# Patient Record
Sex: Female | Born: 1980 | Hispanic: Yes | Marital: Married | Smoking: Never smoker
Health system: Southern US, Community
[De-identification: ages and names within clinical notes are randomized; demographics above are authoritative.]

## PROBLEM LIST (undated history)

## (undated) ENCOUNTER — Inpatient Hospital Stay (HOSPITAL_COMMUNITY): Payer: Self-pay

## (undated) DIAGNOSIS — N920 Excessive and frequent menstruation with regular cycle: Secondary | ICD-10-CM

## (undated) DIAGNOSIS — N941 Unspecified dyspareunia: Secondary | ICD-10-CM

## (undated) DIAGNOSIS — O24419 Gestational diabetes mellitus in pregnancy, unspecified control: Secondary | ICD-10-CM

## (undated) DIAGNOSIS — N930 Postcoital and contact bleeding: Secondary | ICD-10-CM

## (undated) HISTORY — DX: Excessive and frequent menstruation with regular cycle: N92.0

## (undated) HISTORY — DX: Unspecified dyspareunia: N94.10

## (undated) HISTORY — PX: NO PAST SURGERIES: SHX2092

## (undated) HISTORY — DX: Postcoital and contact bleeding: N93.0

---
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
## 2002-01-14 ENCOUNTER — Inpatient Hospital Stay (HOSPITAL_COMMUNITY): Admission: AD | Admit: 2002-01-14 | Discharge: 2002-01-14 | Payer: Self-pay | Admitting: Obstetrics and Gynecology

## 2002-01-19 ENCOUNTER — Encounter: Admission: RE | Admit: 2002-01-19 | Discharge: 2002-01-19 | Payer: Self-pay | Admitting: Obstetrics & Gynecology

## 2002-01-19 ENCOUNTER — Encounter (HOSPITAL_COMMUNITY): Admission: RE | Admit: 2002-01-19 | Discharge: 2002-02-18 | Payer: Self-pay | Admitting: Obstetrics

## 2002-01-20 ENCOUNTER — Encounter: Admission: RE | Admit: 2002-01-20 | Discharge: 2002-04-20 | Payer: Self-pay | Admitting: Obstetrics and Gynecology

## 2002-01-21 ENCOUNTER — Encounter: Payer: Self-pay | Admitting: *Deleted

## 2002-01-26 ENCOUNTER — Encounter: Admission: RE | Admit: 2002-01-26 | Discharge: 2002-01-26 | Payer: Self-pay | Admitting: *Deleted

## 2002-02-02 ENCOUNTER — Encounter: Admission: RE | Admit: 2002-02-02 | Discharge: 2002-02-02 | Payer: Self-pay | Admitting: *Deleted

## 2002-02-09 ENCOUNTER — Encounter: Payer: Self-pay | Admitting: *Deleted

## 2002-02-09 ENCOUNTER — Inpatient Hospital Stay (HOSPITAL_COMMUNITY): Admission: AD | Admit: 2002-02-09 | Discharge: 2002-02-09 | Payer: Self-pay | Admitting: *Deleted

## 2002-02-09 ENCOUNTER — Encounter: Admission: RE | Admit: 2002-02-09 | Discharge: 2002-02-09 | Payer: Self-pay | Admitting: *Deleted

## 2002-02-16 ENCOUNTER — Encounter: Admission: RE | Admit: 2002-02-16 | Discharge: 2002-02-16 | Payer: Self-pay | Admitting: *Deleted

## 2002-02-19 ENCOUNTER — Inpatient Hospital Stay (HOSPITAL_COMMUNITY): Admission: AD | Admit: 2002-02-19 | Discharge: 2002-02-19 | Payer: Self-pay | Admitting: *Deleted

## 2002-02-21 ENCOUNTER — Inpatient Hospital Stay (HOSPITAL_COMMUNITY): Admission: RE | Admit: 2002-02-21 | Discharge: 2002-02-23 | Payer: Self-pay | Admitting: *Deleted

## 2004-09-19 ENCOUNTER — Inpatient Hospital Stay (HOSPITAL_COMMUNITY): Admission: AD | Admit: 2004-09-19 | Discharge: 2004-09-19 | Payer: Self-pay | Admitting: Obstetrics & Gynecology

## 2004-09-24 ENCOUNTER — Inpatient Hospital Stay (HOSPITAL_COMMUNITY): Admission: AD | Admit: 2004-09-24 | Discharge: 2004-09-27 | Payer: Self-pay | Admitting: *Deleted

## 2004-09-24 ENCOUNTER — Ambulatory Visit: Payer: Self-pay | Admitting: Family Medicine

## 2004-09-24 ENCOUNTER — Ambulatory Visit: Payer: Self-pay | Admitting: Obstetrics and Gynecology

## 2005-08-06 ENCOUNTER — Emergency Department (HOSPITAL_COMMUNITY): Admission: EM | Admit: 2005-08-06 | Discharge: 2005-08-06 | Payer: Self-pay | Admitting: Family Medicine

## 2007-02-15 ENCOUNTER — Ambulatory Visit: Payer: Self-pay | Admitting: Internal Medicine

## 2007-02-18 ENCOUNTER — Ambulatory Visit (HOSPITAL_COMMUNITY): Admission: RE | Admit: 2007-02-18 | Discharge: 2007-02-18 | Payer: Self-pay | Admitting: Internal Medicine

## 2007-03-22 ENCOUNTER — Ambulatory Visit: Payer: Self-pay | Admitting: Internal Medicine

## 2007-05-05 ENCOUNTER — Ambulatory Visit: Payer: Self-pay | Admitting: Family Medicine

## 2007-06-03 ENCOUNTER — Ambulatory Visit: Payer: Self-pay | Admitting: Sports Medicine

## 2007-06-03 ENCOUNTER — Encounter (INDEPENDENT_AMBULATORY_CARE_PROVIDER_SITE_OTHER): Payer: Self-pay | Admitting: Family Medicine

## 2007-06-03 LAB — CONVERTED CEMR LAB
Antibody Screen: NEGATIVE
Basophils Absolute: 0 10*3/uL (ref 0.0–0.1)
Eosinophils Absolute: 0.2 10*3/uL (ref 0.0–0.7)
Hemoglobin: 13.5 g/dL (ref 12.0–15.0)
Lymphocytes Relative: 23 % (ref 12–46)
MCHC: 34.1 g/dL (ref 30.0–36.0)
MCV: 86.3 fL (ref 78.0–100.0)
Monocytes Absolute: 0.6 10*3/uL (ref 0.2–0.7)
Monocytes Relative: 7 % (ref 3–11)
Neutro Abs: 6 10*3/uL (ref 1.7–7.7)
Neutrophils Relative %: 67 % (ref 43–77)
Rh Type: POSITIVE
WBC: 8.9 10*3/uL (ref 4.0–10.5)

## 2007-06-10 ENCOUNTER — Ambulatory Visit: Payer: Self-pay | Admitting: Sports Medicine

## 2007-06-10 ENCOUNTER — Encounter (INDEPENDENT_AMBULATORY_CARE_PROVIDER_SITE_OTHER): Payer: Self-pay | Admitting: Family Medicine

## 2007-06-10 DIAGNOSIS — N898 Other specified noninflammatory disorders of vagina: Secondary | ICD-10-CM | POA: Insufficient documentation

## 2007-06-10 LAB — CONVERTED CEMR LAB
Chlamydia, DNA Probe: NEGATIVE
GC Probe Amp, Genital: NEGATIVE
KOH Prep: NEGATIVE

## 2007-06-11 ENCOUNTER — Encounter (INDEPENDENT_AMBULATORY_CARE_PROVIDER_SITE_OTHER): Payer: Self-pay | Admitting: Family Medicine

## 2007-06-16 ENCOUNTER — Encounter (INDEPENDENT_AMBULATORY_CARE_PROVIDER_SITE_OTHER): Payer: Self-pay | Admitting: Family Medicine

## 2007-06-21 ENCOUNTER — Ambulatory Visit (HOSPITAL_COMMUNITY): Admission: RE | Admit: 2007-06-21 | Discharge: 2007-06-21 | Payer: Self-pay | Admitting: Internal Medicine

## 2007-06-22 ENCOUNTER — Encounter (INDEPENDENT_AMBULATORY_CARE_PROVIDER_SITE_OTHER): Payer: Self-pay | Admitting: Family Medicine

## 2007-07-02 ENCOUNTER — Ambulatory Visit: Payer: Self-pay | Admitting: Family Medicine

## 2007-07-02 ENCOUNTER — Telehealth (INDEPENDENT_AMBULATORY_CARE_PROVIDER_SITE_OTHER): Payer: Self-pay | Admitting: *Deleted

## 2007-07-27 ENCOUNTER — Telehealth (INDEPENDENT_AMBULATORY_CARE_PROVIDER_SITE_OTHER): Payer: Self-pay | Admitting: *Deleted

## 2007-07-27 ENCOUNTER — Ambulatory Visit: Payer: Self-pay | Admitting: Family Medicine

## 2007-07-27 ENCOUNTER — Encounter (INDEPENDENT_AMBULATORY_CARE_PROVIDER_SITE_OTHER): Payer: Self-pay | Admitting: Family Medicine

## 2007-07-27 LAB — CONVERTED CEMR LAB
Glucose, Urine, Semiquant: NEGATIVE
Nitrite: NEGATIVE
Protein, U semiquant: NEGATIVE

## 2007-08-24 ENCOUNTER — Ambulatory Visit (HOSPITAL_COMMUNITY): Admission: RE | Admit: 2007-08-24 | Discharge: 2007-08-24 | Payer: Self-pay | Admitting: Sports Medicine

## 2007-08-24 ENCOUNTER — Encounter (INDEPENDENT_AMBULATORY_CARE_PROVIDER_SITE_OTHER): Payer: Self-pay | Admitting: Family Medicine

## 2007-08-31 ENCOUNTER — Ambulatory Visit: Payer: Self-pay | Admitting: Family Medicine

## 2007-08-31 LAB — CONVERTED CEMR LAB
GTT, 1 hr: 189 mg/dL
Hemoglobin: 10.8 g/dL

## 2007-09-03 ENCOUNTER — Encounter (INDEPENDENT_AMBULATORY_CARE_PROVIDER_SITE_OTHER): Payer: Self-pay | Admitting: Family Medicine

## 2007-09-03 ENCOUNTER — Ambulatory Visit: Payer: Self-pay | Admitting: Family Medicine

## 2007-09-03 DIAGNOSIS — O24410 Gestational diabetes mellitus in pregnancy, diet controlled: Secondary | ICD-10-CM | POA: Insufficient documentation

## 2007-09-06 ENCOUNTER — Telehealth (INDEPENDENT_AMBULATORY_CARE_PROVIDER_SITE_OTHER): Payer: Self-pay | Admitting: *Deleted

## 2007-09-17 ENCOUNTER — Ambulatory Visit: Payer: Self-pay | Admitting: Family Medicine

## 2007-10-19 ENCOUNTER — Encounter (INDEPENDENT_AMBULATORY_CARE_PROVIDER_SITE_OTHER): Payer: Self-pay | Admitting: Family Medicine

## 2007-10-19 ENCOUNTER — Ambulatory Visit: Payer: Self-pay | Admitting: Family Medicine

## 2007-11-01 ENCOUNTER — Ambulatory Visit: Payer: Self-pay | Admitting: Family Medicine

## 2007-11-05 ENCOUNTER — Ambulatory Visit: Payer: Self-pay | Admitting: Family Medicine

## 2007-11-05 ENCOUNTER — Encounter (INDEPENDENT_AMBULATORY_CARE_PROVIDER_SITE_OTHER): Payer: Self-pay | Admitting: Family Medicine

## 2007-11-05 LAB — CONVERTED CEMR LAB: Whiff Test: NEGATIVE

## 2007-11-08 ENCOUNTER — Encounter: Payer: Self-pay | Admitting: *Deleted

## 2007-11-08 ENCOUNTER — Ambulatory Visit: Payer: Self-pay | Admitting: Family Medicine

## 2007-11-10 ENCOUNTER — Ambulatory Visit (HOSPITAL_COMMUNITY): Admission: RE | Admit: 2007-11-10 | Discharge: 2007-11-10 | Payer: Self-pay | Admitting: Family Medicine

## 2007-11-11 ENCOUNTER — Encounter (INDEPENDENT_AMBULATORY_CARE_PROVIDER_SITE_OTHER): Payer: Self-pay | Admitting: Family Medicine

## 2007-11-16 ENCOUNTER — Ambulatory Visit: Payer: Self-pay | Admitting: Family Medicine

## 2007-11-22 ENCOUNTER — Ambulatory Visit: Payer: Self-pay | Admitting: Family Medicine

## 2007-11-25 ENCOUNTER — Inpatient Hospital Stay (HOSPITAL_COMMUNITY): Admission: RE | Admit: 2007-11-25 | Discharge: 2007-11-27 | Payer: Self-pay | Admitting: Family Medicine

## 2007-11-25 ENCOUNTER — Ambulatory Visit: Payer: Self-pay | Admitting: Obstetrics and Gynecology

## 2007-12-10 ENCOUNTER — Encounter (INDEPENDENT_AMBULATORY_CARE_PROVIDER_SITE_OTHER): Payer: Self-pay | Admitting: Family Medicine

## 2008-01-24 ENCOUNTER — Encounter: Payer: Self-pay | Admitting: *Deleted

## 2008-05-23 ENCOUNTER — Ambulatory Visit: Payer: Self-pay | Admitting: Internal Medicine

## 2008-05-23 DIAGNOSIS — R1013 Epigastric pain: Secondary | ICD-10-CM

## 2008-05-23 LAB — CONVERTED CEMR LAB: Beta hcg, urine, semiquantitative: NEGATIVE

## 2008-06-15 IMAGING — US US OB LIMITED
1 series · 14 of 26 positions shown · non-contrast
Comparison: none

OBSTETRICAL ULTRASOUND:

 This ultrasound exam was performed in the [HOSPITAL] Ultrasound Department.  The OB US report was generated in the AS system, and faxed to the ordering physician.  This report is also available in [REDACTED] PACS.

[Series 1: us ob limited · 0.31mm/px · 14 of 26 slices shown]
[im 1/26]
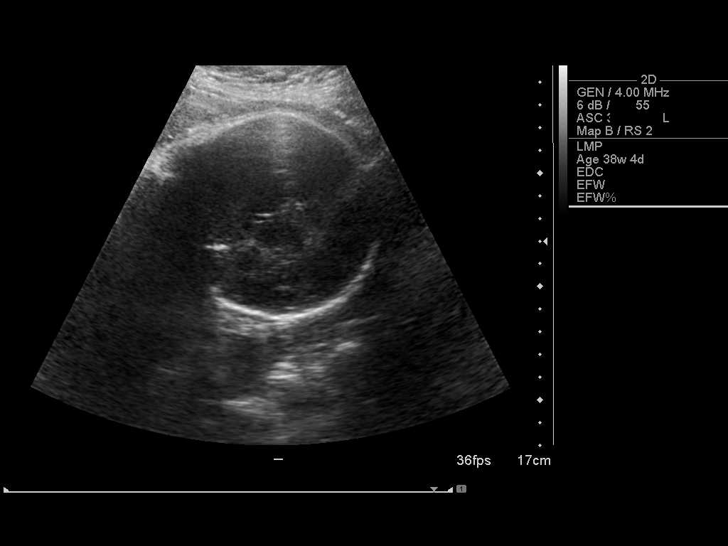
[im 3/26]
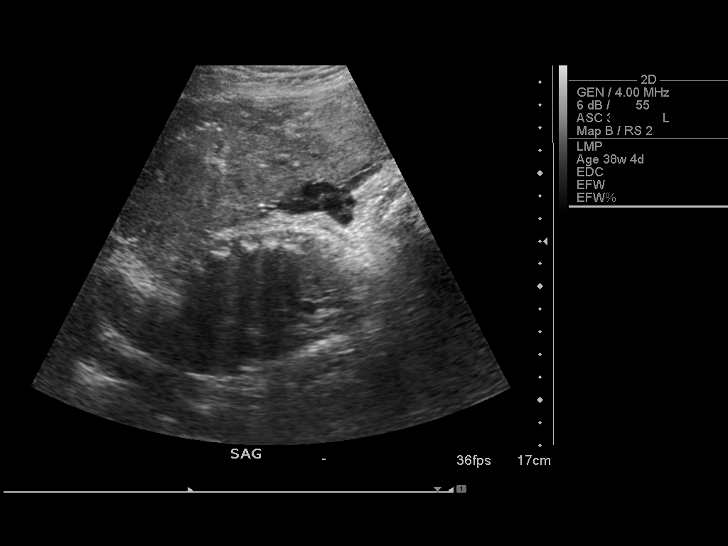
[im 5/26]
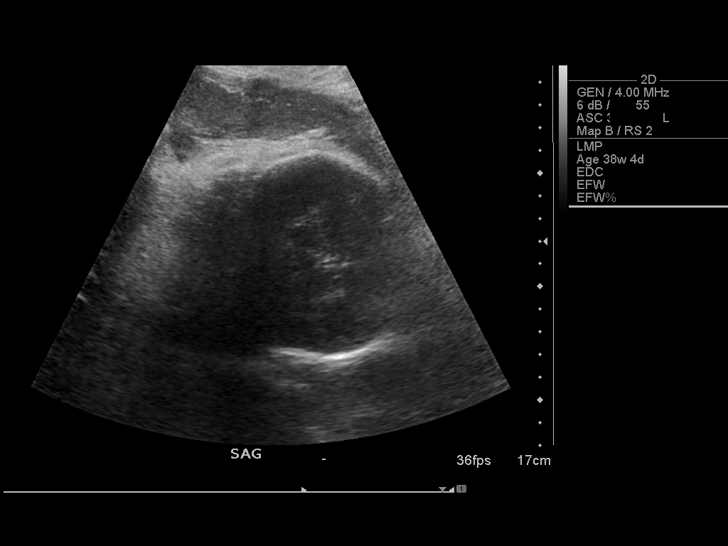
[im 7/26]
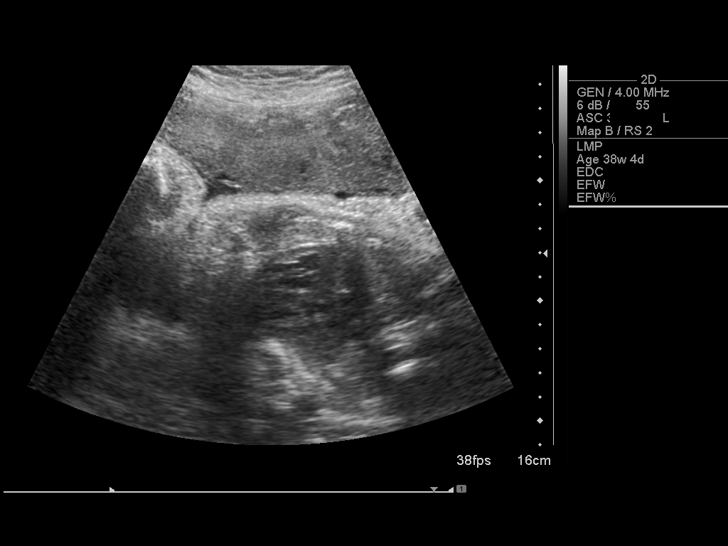
[im 9/26]
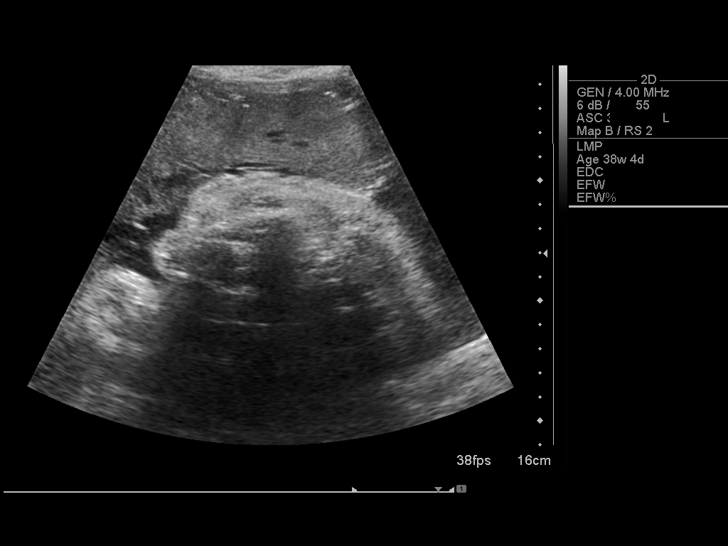
[im 11/26]
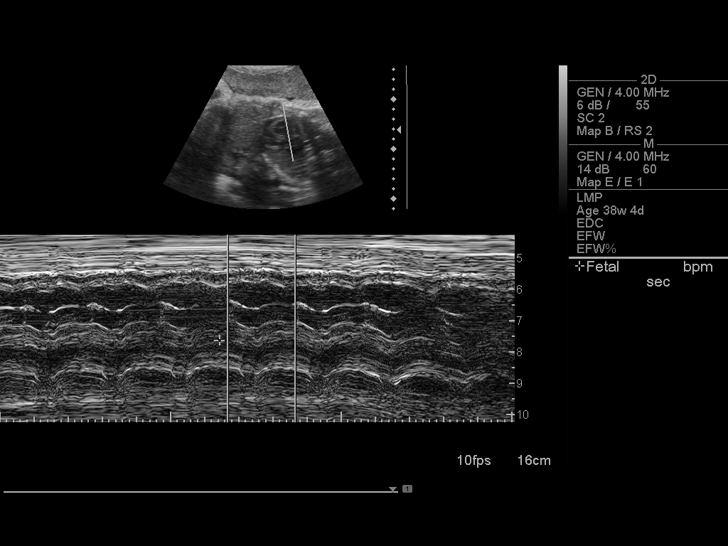
[im 13/26]
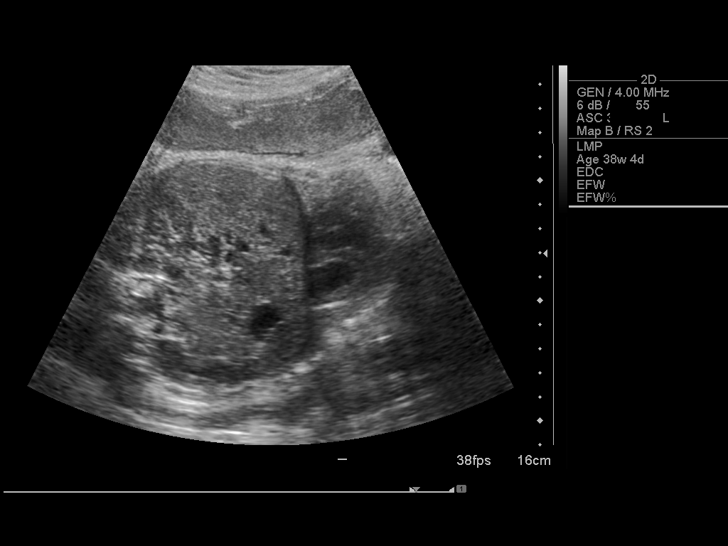
[im 14/26]
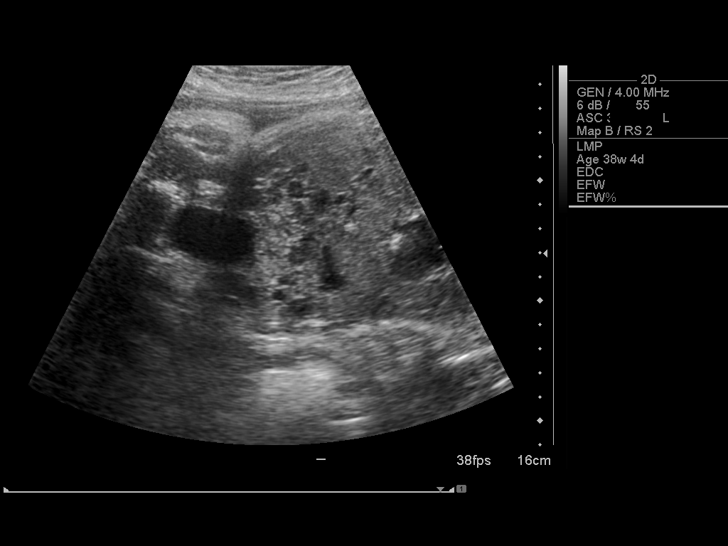
[im 16/26]
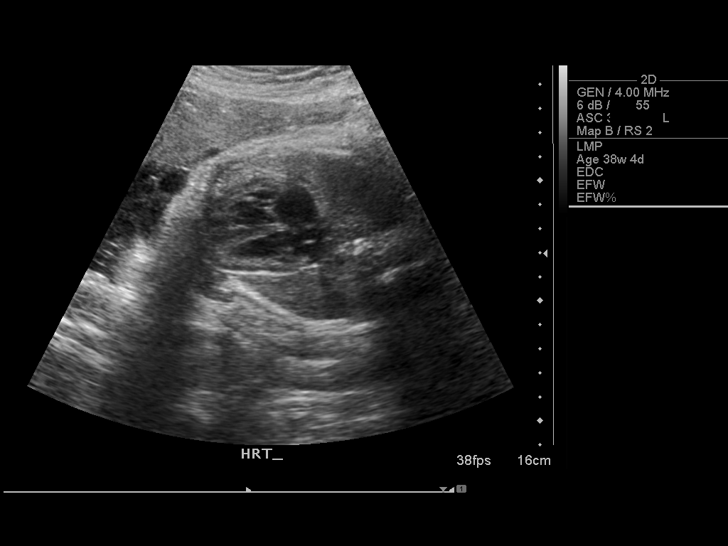
[im 18/26]
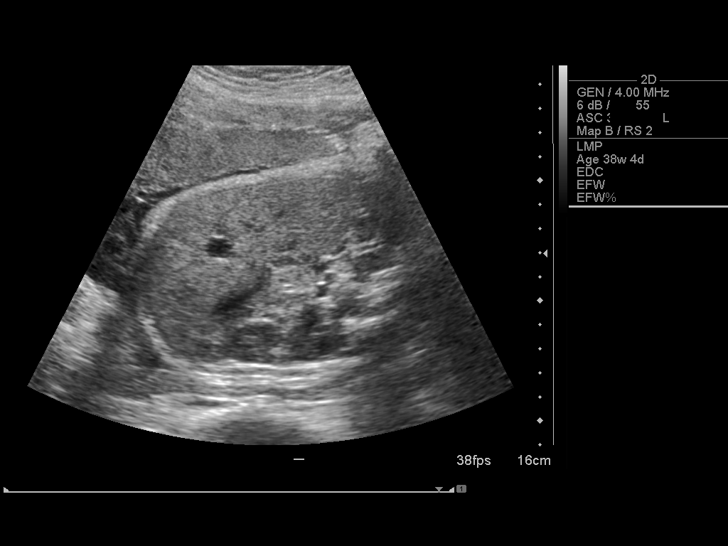
[im 20/26]
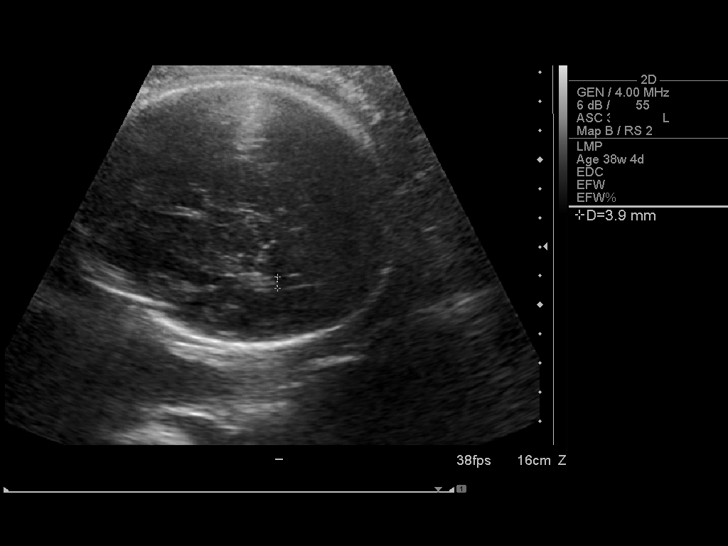
[im 22/26]
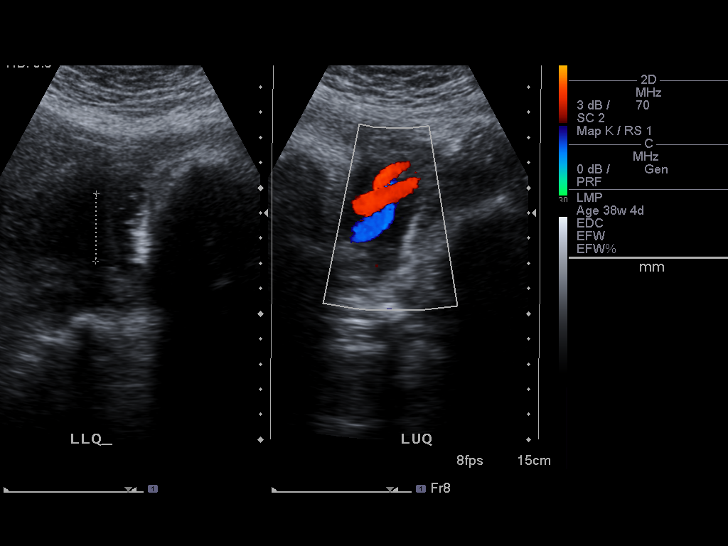
[im 24/26]
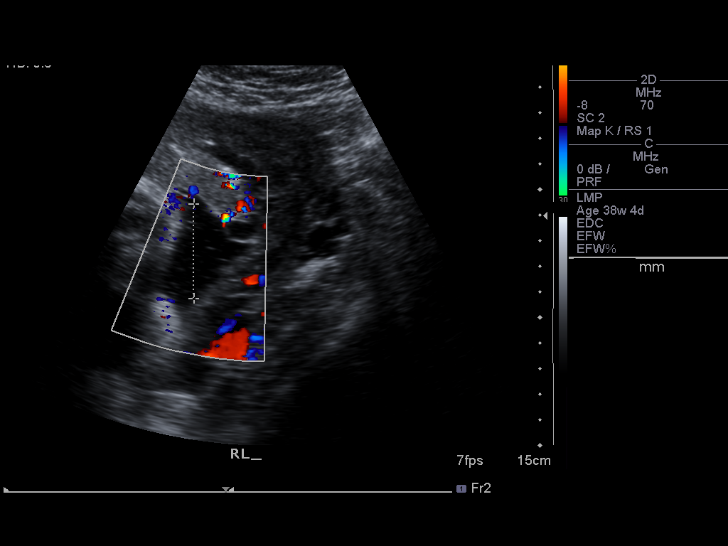
[im 26/26]
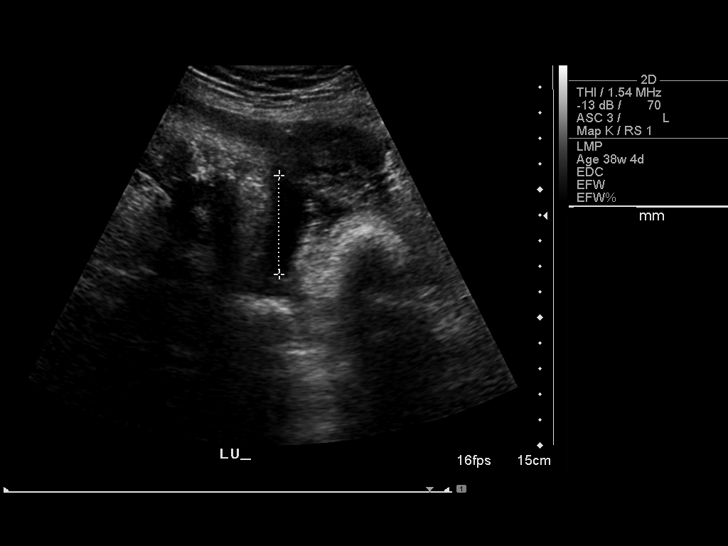

[14 of 26 positions shown; findings below may reference images not displayed]

IMPRESSION: See AS Obstetric US report.

## 2008-06-19 ENCOUNTER — Ambulatory Visit: Payer: Self-pay | Admitting: *Deleted

## 2008-06-19 ENCOUNTER — Ambulatory Visit: Payer: Self-pay | Admitting: Nurse Practitioner

## 2008-06-19 LAB — CONVERTED CEMR LAB
Basophils Absolute: 0.1 10*3/uL (ref 0.0–0.1)
Basophils Relative: 1 % (ref 0–1)
Eosinophils Absolute: 0.1 10*3/uL (ref 0.0–0.7)
Lymphs Abs: 2.6 10*3/uL (ref 0.7–4.0)
MCHC: 32.2 g/dL (ref 30.0–36.0)
MCV: 85.8 fL (ref 78.0–100.0)
Neutrophils Relative %: 54 % (ref 43–77)
Platelets: 256 10*3/uL (ref 150–400)

## 2008-08-30 ENCOUNTER — Emergency Department (HOSPITAL_COMMUNITY): Admission: EM | Admit: 2008-08-30 | Discharge: 2008-08-30 | Payer: Self-pay | Admitting: Emergency Medicine

## 2008-09-21 ENCOUNTER — Encounter (INDEPENDENT_AMBULATORY_CARE_PROVIDER_SITE_OTHER): Payer: Self-pay | Admitting: *Deleted

## 2008-10-16 ENCOUNTER — Telehealth (INDEPENDENT_AMBULATORY_CARE_PROVIDER_SITE_OTHER): Payer: Self-pay | Admitting: *Deleted

## 2008-12-05 ENCOUNTER — Ambulatory Visit: Payer: Self-pay | Admitting: Internal Medicine

## 2008-12-05 LAB — CONVERTED CEMR LAB
Beta hcg, urine, semiquantitative: NEGATIVE
Chlamydia, DNA Probe: NEGATIVE
GC Probe Amp, Genital: NEGATIVE
KOH Prep: NEGATIVE

## 2008-12-27 ENCOUNTER — Ambulatory Visit: Payer: Self-pay | Admitting: Internal Medicine

## 2008-12-27 ENCOUNTER — Encounter (INDEPENDENT_AMBULATORY_CARE_PROVIDER_SITE_OTHER): Payer: Self-pay | Admitting: Internal Medicine

## 2008-12-27 LAB — CONVERTED CEMR LAB
Basophils Absolute: 0.1 10*3/uL (ref 0.0–0.1)
Basophils Relative: 1 % (ref 0–1)
Blood in Urine, dipstick: NEGATIVE
CO2: 19 meq/L (ref 19–32)
Calcium: 9.2 mg/dL (ref 8.4–10.5)
Chloride: 104 meq/L (ref 96–112)
Creatinine, Ser: 0.81 mg/dL (ref 0.40–1.20)
Eosinophils Absolute: 0.2 10*3/uL (ref 0.0–0.7)
Eosinophils Relative: 3 % (ref 0–5)
GC Probe Amp, Genital: NEGATIVE
KOH Prep: NEGATIVE
LDL Cholesterol: 71 mg/dL (ref 0–99)
Lymphocytes Relative: 36 % (ref 12–46)
Lymphs Abs: 2.9 10*3/uL (ref 0.7–4.0)
Monocytes Absolute: 0.7 10*3/uL (ref 0.1–1.0)
Monocytes Relative: 9 % (ref 3–12)
Neutrophils Relative %: 53 % (ref 43–77)
Platelets: 264 10*3/uL (ref 150–400)
Potassium: 3.7 meq/L (ref 3.5–5.3)
Protein, U semiquant: NEGATIVE
RDW: 12.8 % (ref 11.5–15.5)
Sodium: 140 meq/L (ref 135–145)
Specific Gravity, Urine: <1.005
Total CHOL/HDL Ratio: 3.6
Triglycerides: 113 mg/dL (ref <150)
Whiff Test: NEGATIVE

## 2009-01-01 ENCOUNTER — Encounter (INDEPENDENT_AMBULATORY_CARE_PROVIDER_SITE_OTHER): Payer: Self-pay | Admitting: Internal Medicine

## 2009-02-20 ENCOUNTER — Encounter (INDEPENDENT_AMBULATORY_CARE_PROVIDER_SITE_OTHER): Payer: Self-pay | Admitting: Internal Medicine

## 2009-02-23 ENCOUNTER — Telehealth (INDEPENDENT_AMBULATORY_CARE_PROVIDER_SITE_OTHER): Payer: Self-pay | Admitting: Internal Medicine

## 2009-03-07 ENCOUNTER — Encounter (INDEPENDENT_AMBULATORY_CARE_PROVIDER_SITE_OTHER): Payer: Self-pay | Admitting: *Deleted

## 2009-03-27 ENCOUNTER — Telehealth (INDEPENDENT_AMBULATORY_CARE_PROVIDER_SITE_OTHER): Payer: Self-pay | Admitting: *Deleted

## 2009-03-30 ENCOUNTER — Ambulatory Visit: Payer: Self-pay | Admitting: Internal Medicine

## 2009-03-30 LAB — CONVERTED CEMR LAB: Whiff Test: NEGATIVE

## 2009-05-04 ENCOUNTER — Encounter: Payer: Self-pay | Admitting: Family Medicine

## 2009-05-04 ENCOUNTER — Ambulatory Visit: Payer: Self-pay | Admitting: Family Medicine

## 2009-05-04 LAB — CONVERTED CEMR LAB
Basophils Absolute: 0 10*3/uL (ref 0.0–0.1)
Eosinophils Relative: 2 % (ref 0–5)
HCT: 37.9 % (ref 36.0–46.0)
Hemoglobin: 13.2 g/dL (ref 12.0–15.0)
Lymphocytes Relative: 24 % (ref 12–46)
MCHC: 34.8 g/dL (ref 30.0–36.0)
Monocytes Absolute: 0.7 10*3/uL (ref 0.1–1.0)
Monocytes Relative: 7 % (ref 3–12)
RBC: 4.44 M/uL (ref 3.87–5.11)
RDW: 14.2 % (ref 11.5–15.5)
Rh Type: POSITIVE
Rubella: 306.9 intl units/mL — ABNORMAL HIGH
WBC: 9.4 10*3/uL (ref 4.0–10.5)

## 2009-05-11 ENCOUNTER — Encounter: Payer: Self-pay | Admitting: Family Medicine

## 2009-05-11 ENCOUNTER — Ambulatory Visit: Payer: Self-pay | Admitting: Family Medicine

## 2009-05-11 LAB — CONVERTED CEMR LAB

## 2009-06-01 ENCOUNTER — Telehealth: Payer: Self-pay | Admitting: *Deleted

## 2009-06-06 ENCOUNTER — Encounter: Payer: Self-pay | Admitting: Family Medicine

## 2009-06-19 ENCOUNTER — Ambulatory Visit: Payer: Self-pay | Admitting: Family Medicine

## 2009-06-19 ENCOUNTER — Encounter: Payer: Self-pay | Admitting: Family Medicine

## 2009-07-04 ENCOUNTER — Encounter: Payer: Self-pay | Admitting: Family Medicine

## 2009-07-17 ENCOUNTER — Ambulatory Visit: Payer: Self-pay | Admitting: Family Medicine

## 2009-07-17 ENCOUNTER — Encounter: Payer: Self-pay | Admitting: Family Medicine

## 2009-07-17 LAB — CONVERTED CEMR LAB
MCHC: 32.2 g/dL (ref 30.0–36.0)
MCV: 83.8 fL (ref 78.0–100.0)
Platelets: 220 10*3/uL (ref 150–400)
RBC: 3.96 M/uL (ref 3.87–5.11)
RDW: 13.1 % (ref 11.5–15.5)
WBC: 9.1 10*3/uL (ref 4.0–10.5)
Whiff Test: NEGATIVE

## 2009-08-02 ENCOUNTER — Encounter: Payer: Self-pay | Admitting: Family Medicine

## 2009-08-02 ENCOUNTER — Ambulatory Visit: Payer: Self-pay | Admitting: Family Medicine

## 2009-08-07 ENCOUNTER — Encounter: Payer: Self-pay | Admitting: Family Medicine

## 2009-08-07 ENCOUNTER — Ambulatory Visit (HOSPITAL_COMMUNITY): Admission: RE | Admit: 2009-08-07 | Discharge: 2009-08-07 | Payer: Self-pay | Admitting: Family Medicine

## 2009-08-22 ENCOUNTER — Encounter: Payer: Self-pay | Admitting: Family Medicine

## 2009-08-22 ENCOUNTER — Ambulatory Visit: Payer: Self-pay | Admitting: Family Medicine

## 2009-08-22 DIAGNOSIS — K649 Unspecified hemorrhoids: Secondary | ICD-10-CM | POA: Insufficient documentation

## 2009-08-22 LAB — CONVERTED CEMR LAB

## 2009-08-23 ENCOUNTER — Encounter: Payer: Self-pay | Admitting: Family Medicine

## 2009-09-06 ENCOUNTER — Ambulatory Visit: Payer: Self-pay | Admitting: Family Medicine

## 2009-09-19 ENCOUNTER — Ambulatory Visit: Payer: Self-pay | Admitting: Family Medicine

## 2009-09-19 ENCOUNTER — Encounter: Payer: Self-pay | Admitting: Family Medicine

## 2009-10-02 ENCOUNTER — Ambulatory Visit: Payer: Self-pay | Admitting: Family Medicine

## 2009-10-02 ENCOUNTER — Encounter: Payer: Self-pay | Admitting: Family Medicine

## 2009-10-02 LAB — CONVERTED CEMR LAB: Whiff Test: NEGATIVE

## 2009-10-09 ENCOUNTER — Encounter: Payer: Self-pay | Admitting: Family Medicine

## 2009-10-09 ENCOUNTER — Ambulatory Visit: Payer: Self-pay | Admitting: Family Medicine

## 2009-10-12 ENCOUNTER — Inpatient Hospital Stay (HOSPITAL_COMMUNITY): Admission: AD | Admit: 2009-10-12 | Discharge: 2009-10-12 | Payer: Self-pay | Admitting: Obstetrics & Gynecology

## 2009-10-16 ENCOUNTER — Ambulatory Visit: Payer: Self-pay | Admitting: Family Medicine

## 2009-10-22 ENCOUNTER — Encounter: Payer: Self-pay | Admitting: Family Medicine

## 2009-10-22 ENCOUNTER — Ambulatory Visit: Payer: Self-pay | Admitting: Family Medicine

## 2009-10-24 ENCOUNTER — Ambulatory Visit: Payer: Self-pay | Admitting: Obstetrics & Gynecology

## 2009-10-24 ENCOUNTER — Encounter: Payer: Self-pay | Admitting: Family Medicine

## 2009-10-26 ENCOUNTER — Ambulatory Visit: Payer: Self-pay | Admitting: Obstetrics & Gynecology

## 2009-10-26 ENCOUNTER — Encounter: Payer: Self-pay | Admitting: Family Medicine

## 2009-10-28 ENCOUNTER — Inpatient Hospital Stay (HOSPITAL_COMMUNITY): Admission: AD | Admit: 2009-10-28 | Discharge: 2009-10-30 | Payer: Self-pay | Admitting: Obstetrics & Gynecology

## 2009-10-28 ENCOUNTER — Ambulatory Visit: Payer: Self-pay | Admitting: Obstetrics and Gynecology

## 2009-11-06 ENCOUNTER — Telehealth: Payer: Self-pay | Admitting: *Deleted

## 2009-12-04 ENCOUNTER — Ambulatory Visit: Payer: Self-pay | Admitting: Family Medicine

## 2010-01-03 ENCOUNTER — Ambulatory Visit: Payer: Self-pay | Admitting: Internal Medicine

## 2010-01-07 ENCOUNTER — Encounter (INDEPENDENT_AMBULATORY_CARE_PROVIDER_SITE_OTHER): Payer: Self-pay | Admitting: Internal Medicine

## 2010-01-10 ENCOUNTER — Telehealth (INDEPENDENT_AMBULATORY_CARE_PROVIDER_SITE_OTHER): Payer: Self-pay | Admitting: Internal Medicine

## 2010-04-04 ENCOUNTER — Ambulatory Visit: Payer: Self-pay | Admitting: Nurse Practitioner

## 2010-04-04 DIAGNOSIS — N760 Acute vaginitis: Secondary | ICD-10-CM | POA: Insufficient documentation

## 2010-04-04 LAB — CONVERTED CEMR LAB
Beta hcg, urine, semiquantitative: NEGATIVE
Blood in Urine, dipstick: NEGATIVE
Specific Gravity, Urine: >=1.03
WBC Urine, dipstick: NEGATIVE

## 2010-05-01 ENCOUNTER — Telehealth (INDEPENDENT_AMBULATORY_CARE_PROVIDER_SITE_OTHER): Payer: Self-pay | Admitting: Nurse Practitioner

## 2010-05-02 ENCOUNTER — Ambulatory Visit: Payer: Self-pay | Admitting: Nurse Practitioner

## 2010-05-07 ENCOUNTER — Ambulatory Visit: Payer: Self-pay | Admitting: Internal Medicine

## 2010-05-07 DIAGNOSIS — E785 Hyperlipidemia, unspecified: Secondary | ICD-10-CM

## 2010-05-07 DIAGNOSIS — D649 Anemia, unspecified: Secondary | ICD-10-CM

## 2010-05-07 LAB — CONVERTED CEMR LAB
Blood in Urine, dipstick: NEGATIVE
Glucose, Urine, Semiquant: NEGATIVE
KOH Prep: NEGATIVE
Ketones, urine, test strip: NEGATIVE
Whiff Test: NEGATIVE

## 2010-05-08 ENCOUNTER — Encounter (INDEPENDENT_AMBULATORY_CARE_PROVIDER_SITE_OTHER): Payer: Self-pay | Admitting: Internal Medicine

## 2010-05-08 LAB — CONVERTED CEMR LAB
ALT: 21 units/L (ref 0–35)
Albumin: 4.4 g/dL (ref 3.5–5.2)
BUN: 8 mg/dL (ref 6–23)
CO2: 24 meq/L (ref 19–32)
Eosinophils Absolute: 0.4 10*3/uL (ref 0.0–0.7)
HCT: 41.1 % (ref 36.0–46.0)
Hemoglobin: 13.2 g/dL (ref 12.0–15.0)
Lymphs Abs: 3.3 10*3/uL (ref 0.7–4.0)
MCHC: 32.1 g/dL (ref 30.0–36.0)
Monocytes Absolute: 0.8 10*3/uL (ref 0.1–1.0)
Neutro Abs: 5.4 10*3/uL (ref 1.7–7.7)
Neutrophils Relative %: 54 % (ref 43–77)
Platelets: 291 10*3/uL (ref 150–400)
Potassium: 3.9 meq/L (ref 3.5–5.3)
RDW: 15.2 % (ref 11.5–15.5)
Total Bilirubin: 1.2 mg/dL (ref 0.3–1.2)
WBC: 9.9 10*3/uL (ref 4.0–10.5)

## 2010-05-18 ENCOUNTER — Encounter (INDEPENDENT_AMBULATORY_CARE_PROVIDER_SITE_OTHER): Payer: Self-pay | Admitting: Internal Medicine

## 2010-06-07 ENCOUNTER — Telehealth (INDEPENDENT_AMBULATORY_CARE_PROVIDER_SITE_OTHER): Payer: Self-pay | Admitting: Internal Medicine

## 2010-12-25 ENCOUNTER — Ambulatory Visit
Admission: RE | Admit: 2010-12-25 | Discharge: 2010-12-25 | Payer: Self-pay | Source: Home / Self Care | Attending: Internal Medicine | Admitting: Internal Medicine

## 2010-12-25 DIAGNOSIS — M542 Cervicalgia: Secondary | ICD-10-CM | POA: Insufficient documentation

## 2010-12-25 DIAGNOSIS — R071 Chest pain on breathing: Secondary | ICD-10-CM | POA: Insufficient documentation

## 2011-01-21 NOTE — Assessment & Plan Note (Signed)
Summary: CPP EXAM/////GK   Vital Signs:  Patient profile:   30 year old female Menstrual status:  regular LMP:     05/04/2010 Weight:      152 pounds Temp:     97.5 degrees F Pulse rate:   64 / minute Pulse rhythm:   regular Resp:     18 per minute BP sitting:   113 / 76  (left arm) Cuff size:   regular  Vitals Entered By: Vesta Mixer CMA (May 07, 2010 11:35 AM) CC: CPP Is Patient Diabetic? No Pain Assessment Patient in pain? no       Does patient need assistance? Ambulation Normal LMP (date): 05/04/2010 LMP - Character: normal LMP - Reliable? Yes Menarche (age onset years): 15   Menses interval (days): 28 Menstrual flow (days): 5 Date of + home preg. test: 02/14/2009 Enter LMP: 05/04/2010 Last PAP Result NEGATIVE FOR INTRAEPITHELIAL LESIONS OR MALIGNANCY.   Primary Care Provider:  Calyn Sivils  CC:  CPP.  History of Present Illness: 30 yo female here for CPP.  Concerns:  1.  Menstrual irregularities:  Had bleeding for almost a month--was seen by Jesse Fall earlier this month and just started on Yasmin again this past Sunday--stopped after 1 month of use in January as she was dizzy that entire month, but tolerating okay currently.      Habits & Providers  Alcohol-Tobacco-Diet     Alcohol drinks/day: 0     Tobacco Status: never  Exercise-Depression-Behavior     Drug Use: never  Allergies (verified): No Known Drug Allergies  Past History:  Past Medical History: Reviewed history from 12/27/2008 and no changes required. ROUTINE GYNECOLOGICAL EXAMINATION (ICD-V72.31) HEALTH SCREENING (ICD-V70.0) CHEST WALL PAIN, ACUTE (ICD-786.52) VAGINITIS (ICD-616.10) EXCESSIVE OR FREQUENT MENSTRUATION (ICD-626.2) EPIGASTRIC PAIN (ICD-789.06) CONTRACEPTIVE MANAGEMENT (ICD-V25.09) HYPERGLYCEMIA (ICD-790.29) UTI (ICD-599.0) LEUKORRHEA (ICD-623.5) PREGNANCY, NORMAL (ICD-V22.2)  Past Surgical History: Reviewed history from 12/27/2008 and no changes  required. None  Family History: Reviewed history from 03/30/2009 and no changes required. Mother, 24:  Ovarian cysts Father, 46:  Healthy 5 Brothers:  Healthy 3 Sisters:  1 sister with depression, unknown heart problem 3 Children:  40 yo daughter, 45 1/2 yo son, 50 1/2 year old son,  16 months  old son:  all healthy.  Social History: Reviewed history from 05/11/2009 and no changes required. Lives at home with husband and 4 children  Housewife Denies tobacco, alcohol, or substance abuseDrug Use:  never  Review of Systems General:  Energy is good. Eyes:  Denies blurring. ENT:  Denies decreased hearing. CV:  Denies chest pain or discomfort and palpitations. Resp:  Denies shortness of breath. GI:  Denies bloody stools, constipation, dark tarry stools, and diarrhea; Epigastric pain--radiates straight through to back.  Rates as a 2 out of 10 in severity.  Gets when does not eat breakfast until late in morning--only time.  Sometimes burning, sometimes like she has been hit there.  Not very frequent.. GU:  Denies discharge, dysuria, and urinary frequency. MS:  Denies joint pain, joint redness, and joint swelling. Derm:  Denies lesion(s) and rash. Neuro:  Denies numbness, tingling, and weakness. Psych:  Had some depression before had last baby,started walking and resolved.  Has not had since last child born.  No suicidal or homicidal thoughts..  Physical Exam  General:  Well-developed,well-nourished,in no acute distress; alert,appropriate and cooperative throughout examination Head:  Normocephalic and atraumatic without obvious abnormalities. No apparent alopecia or balding. Eyes:  No corneal or conjunctival inflammation noted. EOMI. Perrla.  Funduscopic exam benign, without hemorrhages, exudates or papilledema. Vision grossly normal. Ears:  External ear exam shows no significant lesions or deformities.  Otoscopic examination reveals clear canals, tympanic membranes are intact bilaterally  without bulging, retraction, inflammation or discharge. Hearing is grossly normal bilaterally. Nose:  External nasal examination shows no deformity or inflammation. Nasal mucosa are pink and moist without lesions or exudates. Mouth:  Oral mucosa and oropharynx without lesions or exudates.  Teeth in good repair. Neck:  No deformities, masses, or tenderness noted. Breasts:  No mass, nodules, thickening, tenderness, bulging, retraction, inflamation, nipple discharge or skin changes noted.   Lungs:  Normal respiratory effort, chest expands symmetrically. Lungs are clear to auscultation, no crackles or wheezes. Heart:  Normal rate and regular rhythm. S1 and S2 normal without gallop, murmur, click, rub or other extra sounds. Abdomen:  Bowel sounds positive,abdomen soft and non-tender without masses, organomegaly or hernias noted. Genitalia:  Pelvic Exam:        External: normal female genitalia without lesions or masses        Vagina: normal without lesions or masses        Cervix: normal without lesions or masses        Adnexa: normal bimanual exam without masses or fullness        Uterus: normal by palpation        Pap smear: performed Msk:  No deformity or scoliosis noted of thoracic or lumbar spine.   Pulses:  R and L carotid,radial,femoral,dorsalis pedis and posterior tibial pulses are full and equal bilaterally Extremities:  No clubbing, cyanosis, edema, or deformity noted with normal full range of motion of all joints.   Neurologic:  No cranial nerve deficits noted. Station and gait are normal. Plantar reflexes are down-going bilaterally. DTRs are symmetrical throughout. Sensory, motor and coordinative functions appear intact. Skin:  Intact without suspicious lesions or rashes Cervical Nodes:  No lymphadenopathy noted Axillary Nodes:  No palpable lymphadenopathy Inguinal Nodes:  No significant adenopathy Psych:  Cognition and judgment appear intact. Alert and cooperative with normal attention  span and concentration. No apparent delusions, illusions, hallucinations   Impression & Recommendations:  Problem # 1:  ROUTINE GYNECOLOGICAL EXAMINATION (ICD-V72.31)  Orders: KOH/ WET Mount [id number]) Pap Smear, Thin Prep ( Collection of) [phone]) UA Dipstick w/o Micro (manual) (84696) T-HIV Antibody  (Reflex) [id number]) T-Syphilis Test (RPR) [phone]) T-Pap Smear, Thin Prep ([address]) T- GC Chlamydia (47425)  Problem # 2:  ANEMIA (ICD-285.9)  Orders: T-CBC w/Diff ([id number])  Problem # 3:  DYSLIPIDEMIA (ICD-272.4)  Orders: T-Lipid Profile ([id number])  Problem # 4:  CONTRACEPTIVE MANAGEMENT (ICD-V25.09) To continue on Yasmin--if a problem with it or raises the funds for IUD placement, to call.  Problem # 5:  EPIGASTRIC PAIN (ICD-789.06) To call if becomes a problem more than when she misses breakfast  Complete Medication List: 1)  Yasmin 28 3-0.03 Mg Tabs (Drospirenone-ethinyl estradiol) .... Take 1 tab daily as directed.  Other Orders: T-Comprehensive Metabolic Panel ([id number])  Patient Instructions: 1)  CPP with Dr. Delrae Alfred in 1 year.  Call for appt.  Preventive Care Screening  Prior Values:    Pap Smear:  NEGATIVE FOR INTRAEPITHELIAL LESIONS OR MALIGNANCY. (12/27/2008)    Last Tetanus Booster:  Td (02/18/2007)     SBE:  Yes--no changes. LMP:  as above--bled for a month. Osteoprevention:  1 cup of milk daily.  Finds dairy constipating.  Walks in afternoon.  Prescriptions: YASMIN 28 3-0.03 MG TABS (DROSPIRENONE-ETHINYL ESTRADIOL) Take 1 tab daily  as directed.  #1 pack x 11   Entered and Authorized by:   Julieanne Manson MD   Signed by:   Julieanne Manson MD on 05/07/2010   Method used:   Faxed to ...       [facility] (retail)       [address] [place], [place]  [address]       Ph: [phone]       Fax: [phone]   RxID:   [id number]   Laboratory Results   Urine  Tests    Routine Urinalysis   Glucose: negative   (Normal Range: Negative) Bilirubin: negative   (Normal Range: Negative) Ketone: negative   (Normal Range: Negative) Spec. Gravity: 1.010   (Normal Range: 1.003-1.035) Blood: negative   (Normal Range: Negative) pH: 7.0   (Normal Range: 5.0-8.0) Protein: negative   (Normal Range: Negative) Urobilinogen: negative   (Normal Range: 0-1) Nitrite: negative   (Normal Range: Negative) Leukocyte Esterace: trace   (Normal Range: Negative)      Wet Mount/KOH Source: vaginal WBC/hpf: 5-10 Bacteria/hpf: 1+ Clue cells/hpf: few  Negative whiff Yeast/hpf: none Trichomonas/hpf: none  Other Tests  Rapid HIV: negative    Laboratory Results   Urine Tests    Routine Urinalysis   Glucose: negative   (Normal Range: Negative) Bilirubin: negative   (Normal Range: Negative) Ketone: negative   (Normal Range: Negative) Spec. Gravity: 1.010   (Normal Range: 1.003-1.035) Blood: negative   (Normal Range: Negative) pH: 7.0   (Normal Range: 5.0-8.0) Protein: negative   (Normal Range: Negative) Urobilinogen: negative   (Normal Range: 0-1) Nitrite: negative   (Normal Range: Negative) Leukocyte Esterace: trace   (Normal Range: Negative)      Wet Mount  Negative whiff Wet Mount KOH: Negative  Other Tests  Rapid HIV: negative

## 2011-01-21 NOTE — Assessment & Plan Note (Signed)
Summary: Acute - Bacterial Vaginosis   Vital Signs:  Patient profile:   30 year old female Menstrual status:  regular Height:      61 inches Weight:      146 pounds BMI:     27.69 Temp:     97.6 degrees F oral Pulse rate:   82 / minute Pulse rhythm:   regular Resp:     20 per minute BP sitting:   116 / 76  (left arm) Cuff size:   regular  Vitals Entered By: [place] Shannon (April 04, 2010 9:57 AM) CC: pt is here for vaginal infection, Vaginal discharge Is Patient Diabetic? No Pain Assessment Patient in pain? no       Does patient need assistance? Functional Status Self care Ambulation Normal LMP - Character: normal LMP - Reliable? Yes Menarche (age onset years): 15   Menses interval (days): 28 Menstrual flow (days): 5 Date of + home preg. test: 02/14/2009 Menstrual Status regular Last PAP Result NEGATIVE FOR INTRAEPITHELIAL LESIONS OR MALIGNANCY.   Primary Care Provider:  Delrae Alfred  CC:  pt is here for vaginal infection and Vaginal discharge.  History of Present Illness: Family Planning Menses - March 24th that lasted for 5 days She used vagisil  over the counter which was not effective for current discharge Infant if 43 months old - she stopped taking her birth control pills due to nausea and dizziness. She was taking the medication at 10PM at night.  No current contraception. She was started on the medications after giving birth to her now 80 month old son.  She took the medication for 1 month then stopped.  Vaginal discharge      This is a 30 year old woman who presents with Vaginal discharge.  The symptoms began 4 days ago.  The severity is described as moderate.  The patient complains of itching, but denies vaginal burning, burning on urination, and fever.  The discharge is described as yellow.  Associated symptoms include unusual vaginal bleeding.  The patient denies the following symptoms: genital sores, painful intercourse, rash, and headache.    Current  Medications (verified): 1)  Prenatal Vitamins 0.8 Mg Tabs (Prenatal Multivit-Min-Fe-Fa) .Marland Kitchen.. 1 Tab By Mouth Daily 2)  Anusol-Hc 2.5 % Crea (Hydrocortisone) .... Apply To Rectum Twice A Day 3)  Ortho Micronor 0.35 Mg Tabs (Norethindrone (Contraceptive)) .... Take 1 Tab By Mouth Daily At The Same Time Everyday 4)  Yasmin 28 3-0.03 Mg Tabs (Drospirenone-Ethinyl Estradiol) .... Take 1 Tab Daily As Directed.  Allergies (verified): No Known Drug Allergies  Review of Systems General:  Denies fever. Resp:  Denies cough. GI:  Denies abdominal pain, nausea, and vomiting. GU:  Complains of discharge, dysuria, and urinary frequency.  Physical Exam  General:  alert.   Head:  normocephalic.   Lungs:  normal breath sounds.   Heart:  normal rate and regular rhythm.   Msk:  normal ROM.   Neurologic:  alert & oriented X3.   Psych:  Oriented X3.     Impression & Recommendations:  Problem # 1:  BACTERIAL VAGINITIS (ICD-616.10)  pt advised of the dx handout given  will start antibiotic Her updated medication list for this problem includes:    Metronidazole 500 Mg Tabs (Metronidazole) ..... One tablet by mouth two times a day  Orders: KOH/ WET Mount [id number]) UA Dipstick w/o Micro (manual) (98119)  Problem # 2:  CONTRACEPTIVE MANAGEMENT (ICD-V25.09)  urine pregnancy negative today advised pt to restart on oral contraceptives  she should take in the morning after food. if side effects occur she should notify this office prior to stopping Orders: Urine Pregnancy Test  ([address])  Complete Medication List: 1)  Prenatal Vitamins 0.8 Mg Tabs (Prenatal multivit-min-fe-fa) .Marland Kitchen.. 1 tab by mouth daily 2)  Yasmin 28 3-0.03 Mg Tabs (Drospirenone-ethinyl estradiol) .... Take 1 tab daily as directed. 3)  Metronidazole 500 Mg Tabs (Metronidazole) .... One tablet by mouth two times a day  Patient Instructions: 1)  You have a vaginal infection. 2)  Take flagyl 500mg  by mouth two times a day x 7 days. 3)   Restart your birth control pills.  You can request them from your pharmacy. 4)  Your pregnancy test was negative today Prescriptions: METRONIDAZOLE 500 MG TABS (METRONIDAZOLE) One tablet by mouth two times a day  #14 x 0   Entered and Authorized by:   Lehman Prom FNP   Signed by:   Lehman Prom FNP on 04/04/2010   Method used:   Print then Give to Patient   RxID:   [id number]   Laboratory Results   Urine Tests  Date/Time Received: April 04, 2010 10:31 AM   Routine Urinalysis   Glucose: negative   (Normal Range: Negative) Bilirubin: negative   (Normal Range: Negative) Ketone: negative   (Normal Range: Negative) Spec. Gravity: >=1.030   (Normal Range: 1.003-1.035) Blood: negative   (Normal Range: Negative) pH: 6.0   (Normal Range: 5.0-8.0) Protein: negative   (Normal Range: Negative) Urobilinogen: 0.2   (Normal Range: 0-1) Nitrite: negative   (Normal Range: Negative) Leukocyte Esterace: negative   (Normal Range: Negative)    Urine HCG: negative Date/Time Received: April 04, 2010 11:09 AM   Wet Mount Source: vaginal WBC/hpf: 1-5 Bacteria/hpf: rare Clue cells/hpf: few Yeast/hpf: none Wet Mount KOH: Negative Trichomonas/hpf: none

## 2011-01-21 NOTE — Assessment & Plan Note (Signed)
Summary: BIRTH CONTROL OPTIONS//GK   Vital Signs:  Patient profile:   30 year old female LMP:     12/25/2009 Weight:      149.2 pounds Temp:     97.8 degrees F Pulse rate:   80 / minute Pulse rhythm:   regular Resp:     18 per minute BP sitting:   121 / 74  (left arm) Cuff size:   regular  Vitals Entered By: Vesta Mixer CMA (January 03, 2010 11:30 AM) CC: discuss birth control options Is Patient Diabetic? No Pain Assessment Patient in pain? no       Does patient need assistance? Ambulation Normal LMP (date): 12/25/2009 LMP - Character: normal LMP - Reliable? Yes Menarche (age onset years): 15   Menses interval (days): 28 Menstrual flow (days): 4 Date of + home preg. test: 02/14/2009 Enter LMP: 12/25/2009 Last PAP Result NEGATIVE FOR INTRAEPITHELIAL LESIONS OR MALIGNANCY.   Primary Care Provider:  Delrae Alfred  CC:  discuss birth control options.  History of Present Illness: Pt. here for discussion of birth control.  Has had discussion with Dr. Lelon Perla.  Discussed starting Micronor as bridge to when pt. can afford IUD.  Husband not working right now and cannot afford.  2 mos old son would not nurse, so she is bottle feeding, not nursing.  Would like to know if she can get an IUD placed somewhere much less costly than $220.  [facility], but do not know what they charge.    Pt. states she is not good with pills and would prefer IUD.  Until can do so, would like to take Charlyne Mom took this before and feels tolerated well.  LMP was 12/25/09  through 12/29/09.  Period was heavy.  Allergies (verified): No Known Drug Allergies  Physical Exam  General:  NAD   Complete Medication List: 1)  Prenatal Vitamins 0.8 Mg Tabs (Prenatal multivit-min-fe-fa) .Marland Kitchen.. 1 tab by mouth daily 2)  Anusol-hc 2.5 % Crea (Hydrocortisone) .... Apply to rectum twice a day 3)  Ortho Micronor 0.35 Mg Tabs (Norethindrone (contraceptive)) .... Take 1 tab by mouth daily at  the same time everyday 4)  Yasmin 28 3-0.03 Mg Tabs (Drospirenone-ethinyl estradiol) .... Take 1 tab daily as directed.  Other Orders: Gynecologic Referral (Gyn)  Patient Instructions: 1)  CPP  with Dr. Delrae Alfred next available. Prescriptions: YASMIN 28 3-0.03 MG TABS (DROSPIRENONE-ETHINYL ESTRADIOL) Take 1 tab daily as directed.  #1 pack x 4   Entered and Authorized by:   Julieanne Manson MD   Signed by:   Julieanne Manson MD on 01/03/2010   Method used:   Faxed to ...       [facility] (retail)       [address] [place], [place]  [address]       Ph: [phone]       Fax: [phone]   RxID:   [id number]

## 2011-01-21 NOTE — Letter (Signed)
Summary: REFERRAL/GYNECOLOGIC//PAY UP FRONT  REFERRAL/GYNECOLOGIC//PAY UP FRONT   Imported By: Arta Bruce 02/18/2010 14:31:03  _____________________________________________________________________  External Attachment:    Type:   Image     Comment:   External Document

## 2011-01-21 NOTE — Progress Notes (Signed)
Summary: BCP  Phone Note From Pharmacy   Summary of Call: No longer carrying Yasmin--let pt. know switching to something similar--Reclipsen--to use condoms first month of use to prevent pregnancy. Initial call taken by: Julieanne Manson MD,  June 07, 2010 8:35 AM  Follow-up for Phone Call        Left message on answer machine for pt. to return call. Gaylyn Cheers RN  June 07, 2010 12:38 PM    Additional Follow-up for Phone Call Additional follow up Details #1::        Left message with child that we would call back. Additional Follow-up by: Vesta Mixer CMA,  June 07, 2010 5:21 PM    Additional Follow-up for Phone Call Additional follow up Details #2::    Pt notified via Arna Medici. Follow-up by: Vesta Mixer CMA,  June 11, 2010 12:41 PM  New/Updated Medications: RECLIPSEN 0.15-30 MG-MCG TABS (DESOGESTREL-ETHINYL ESTRADIOL) Take 1 tab by mouth daily as directed. Prescriptions: RECLIPSEN 0.15-30 MG-MCG TABS (DESOGESTREL-ETHINYL ESTRADIOL) Take 1 tab by mouth daily as directed.  #1 month x 11   Entered and Authorized by:   Julieanne Manson MD   Signed by:   Julieanne Manson MD on 06/07/2010   Method used:   Faxed to ...       [facility] - [facility] (retail)       [address] [place], [place]  [address]       Ph: [phone]       Fax: [phone]   RxID:   [id number]

## 2011-01-21 NOTE — Letter (Signed)
Summary: Lipid Letter  [facility]  [address] [place], [place] [address]   Phone: [phone]  Fax: [phone]    05/18/2010  Kimberly Avila [address], [place]  [address]  Dear Kimberly Avila:  We have carefully reviewed your last lipid profile from 05/08/2010 and the results are noted below with a summary of recommendations for lipid management.    Cholesterol:       124     Goal: < 200   HDL "good" Cholesterol:   50     Goal: >45   LDL "bad" Cholesterol:   54     Goal: <100   Triglycerides:       101     Goal: <150    Cholesterol is excellent.  Rest of labs and pap were normal.    TLC Diet (Therapeutic Lifestyle Change): Saturated Fats & Transfatty acids should be kept < 7% of total calories ***Reduce Saturated Fats Polyunstaurated Fat can be up to 10% of total calories Monounsaturated Fat Fat can be up to 20% of total calories Total Fat should be no greater than 25-35% of total calories Carbohydrates should be 50-60% of total calories Protein should be approximately 15% of total calories Fiber should be at least 20-30 grams a day ***Increased fiber may help lower LDL Total Cholesterol should be < 200mg /day Consider adding plant stanol/sterols to diet (example: Benacol spread) ***A higher intake of unsaturated fat may reduce Triglycerides and Increase HDL    Adjunctive Measures (may lower LIPIDS and reduce risk of Heart Attack) include: Aerobic Exercise (20-30 minutes 3-4 times a week) Limit Alcohol Consumption Weight Reduction Aspirin 75-81 mg a day by mouth (if not allergic or contraindicated) Dietary Fiber 20-30 grams a day by mouth     Current Medications: 1)    Yasmin 28 3-0.03 Mg Tabs (Drospirenone-ethinyl estradiol) .... Take 1 tab daily as directed.  If you have any questions, please call. We appreciate being able to work with you.   Sincerely,    [facility] Julieanne Manson MD

## 2011-01-21 NOTE — Progress Notes (Signed)
Summary: Office Visit//DEPRESSION SCREENING  Office Visit//DEPRESSION SCREENING   Imported By: Arta Bruce 07/01/2010 12:19:03  _____________________________________________________________________  External Attachment:    Type:   Image     Comment:   External Document

## 2011-01-21 NOTE — Assessment & Plan Note (Signed)
Summary: Contraception management   Vital Signs:  Patient profile:   30 year old female Menstrual status:  regular LMP:     03/29/2010 Weight:      153.8 pounds BMI:     29.17 BSA:     1.69 Temp:     97.5 degrees F oral Pulse rate:   56 / minute Pulse rhythm:   regular Resp:     16 per minute BP sitting:   107 / 65  (left arm) Cuff size:   regular  Vitals Entered By: Levon Hedger (May 02, 2010 11:07 AM) CC: since 02/26/10 pt has been bleeding but yesterday much less and today very little...has passed something that was brownish in color with very bad smell Is Patient Diabetic? No Pain Assessment Patient in pain? yes     Location: stomach  Does patient need assistance? Functional Status Self care Ambulation Normal LMP (date): 03/29/2010 LMP - Character: normal LMP - Reliable? Yes Menarche (age onset years): 15   Menses interval (days): 28 Menstrual flow (days): 5 Date of + home preg. test: 02/14/2009 Menstrual Status regular Enter LMP: 03/29/2010 Last PAP Result NEGATIVE FOR INTRAEPITHELIAL LESIONS OR MALIGNANCY.   Primary Care Provider:  Delrae Alfred  CC:  since 02/26/10 pt has been bleeding but yesterday much less and today very little...has passed something that was brownish in color with very bad smell.  History of Present Illness:  Pt into the office with complaints of menstrual irregularities March 24th (normal) April 8th ongoing with spotting since that time April 14th - appt in this office for an acute visit Dx with Bacterial vaginosis She took her medications as ordered - flagyl Pt is not on any contraception at this time because she has been waiting for her menses to stop and it has not. She was given an prescription for yaz during her visit in January. Pt has not been sexually active since her last visit here due to spotting.  language line used for interpreter Presents today with infant child - 6 months. Menses not normal since given birth  Allergies  (verified): No Known Drug Allergies  Review of Systems CV:  Denies chest pain or discomfort. Resp:  Denies cough. GI:  Complains of abdominal pain; cramping at times during menses. GU:  Complains of abnormal vaginal bleeding.  Physical Exam  General:  alert.   Head:  normocephalic.   Neurologic:  alert & oriented X3.   Skin:  color normal.   Psych:  Oriented X3.     Impression & Recommendations:  Problem # 1:  CONTRACEPTIVE MANAGEMENT (ICD-V25.09) start oral contraceptives on this sunday advised condom use until f/u visit  Complete Medication List: 1)  Yasmin 28 3-0.03 Mg Tabs (Drospirenone-ethinyl estradiol) .... Take 1 tab daily as directed.  Patient Instructions: 1)  Start birth control pills on Sunday. Take daily at the same time. 2)  Start a multivitamin daily (buy this over the counter) 3)  Follow up in this office in 4 weeks to assess medication and spotting

## 2011-01-21 NOTE — Progress Notes (Signed)
Summary: Acute Issue   Phone Note Call from Patient   Summary of Call: Since last month, the pt is bleeding a lot and last night she discharge again a brownish color with bad odor.  She is current pt from Dr Delrae Alfred but I am wondered if we can use one of those acute appointments.  Mulberry MD Initial call taken by: Manon Hilding,  May 01, 2010 8:55 AM  Follow-up for Phone Call        there is an acute opening today in which pt can be seen for wet prep and u/a. See if pt can come today and schedule in whichever acute slot (10 minute) is available this afternoon Follow-up by: Lehman Prom FNP,  May 01, 2010 9:49 AM  Additional Follow-up for Phone Call Additional follow up Details #1::        Pt will come tomorrow at 11:30 am.Graciela Kellar  May 01, 2010 3:06 PM

## 2011-01-21 NOTE — Letter (Signed)
Summary: Handout Printed  Printed Handout:  - Bacterial Vaginosis (BV) 

## 2011-01-21 NOTE — Progress Notes (Signed)
  Phone Note Outgoing Call   Summary of Call: Please check with pt. and see if she is okay with just taking Yasmin as the IUD cost at [facility] is the same as was quoted by Dr. Lelon Perla and no payment plans are offered. Initial call taken by: Julieanne Manson MD,  January 10, 2010 8:19 AM  Follow-up for Phone Call        Pt is fine with the Yasmin Follow-up by: Vesta Mixer CMA,  January 14, 2010 11:37 AM

## 2011-01-23 NOTE — Assessment & Plan Note (Signed)
Summary: *HEART PROBLEMS / NS   Vital Signs:  Patient profile:   30 year old female Menstrual status:  regular LMP:     11/28/2010 Weight:      155.25 pounds Temp:     97 degrees F oral Pulse rate:   66 / minute Pulse rhythm:   regular Resp:     18 per minute BP sitting:   108 / 84  (left arm) Cuff size:   regular  Vitals Entered By: Hale Drone CMA (December 25, 2010 11:01 AM) CC: Having sharp chest pains near heart area x22 days. When she inhales, she feels the shar pain. When she exhales, the pain will persist. Having nervous ticks in the posterior of head.  Is Patient Diabetic? No Pain Assessment Patient in pain? no       Does patient need assistance? Functional Status Self care Ambulation Normal LMP (date): 11/28/2010 LMP - Character: normal LMP - Reliable? Yes Menarche (age onset years): 15   Menses interval (days): 28 Menstrual flow (days): 5 Date of + home preg. test: 02/14/2009 Enter LMP: 11/28/2010 Last PAP Result NEGATIVE FOR INTRAEPITHELIAL LESIONS OR MALIGNANCY.   Primary Care Provider:  Delrae Alfred  CC:  Having sharp chest pains near heart area x22 days. When she inhales, she feels the shar pain. When she exhales, and the pain will persist. Having nervous ticks in the posterior of head. Marland Kitchen  History of Present Illness: 1.  Sharp pain in left chest for about 1 month.  Pain worse with deep breathing.  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  Does note the pain with picking up child as well.  Tries to limit lifting him when the pain is bad.  No history of injury to the area.  Cannot recall over doing it with lifting or pulling prior to onset of pain.  Has not really noted pain with twisting of trunk.  May have some dyspnea with deep breathing at times.  Pain comes on when stands up. Sometimes even when at rest--generally after had been using her arms.  Not clear if truly had any leg  pain before or while  this started.  Sounds like she develops muscle discomfort after exercise.  No recent long travels before onset.  No cough or respiratory symptoms of infection or allergy prior to onset.  2.  Pinching discomfort at left nuchal, posterior neck area.  Describes like a palpitation or like muscle twitching there.  No headache or pain associated.  Rare occurence--cannot quantitate how often.  Also for one month.  Denies stress.    Current Medications (verified): 1)  Reclipsen 0.15-30 Mg-Mcg Tabs (Desogestrel-Ethinyl Estradiol) .... Take 1 Tab By Mouth Daily As Directed.  Allergies (verified): No Known Drug Allergies  Family History: Mother, 94:  Ovarian cysts Father, 80:  Healthy 5 Brothers:  Healthy 3 Sisters:  1 sister with depression, unknown heart problem--sound congenital in nature 3 Children:  43 yo daughter, 43 1/2 yo son, 21 1/2 year old son,  74 months  old son:  all healthy. No clotting problems run in family  Physical Exam  General:  NAD Head:  MIld tenderness over left paraspinous musculature at posterior neck Chest Wall:  Very tender over ribs3-6, left anterior chest--all the way to sternum--reproduces pain Lungs:  Normal respiratory effort, chest expands symmetrically. Lungs are clear to auscultation, no crackles or wheezes. No obvious discomfort with deep breathing during  exam Heart:  Normal rate and regular rhythm. S1 and S2 normal without gallop, murmur, click, rub or other extra sounds.  Radial pulses normal and equal   Impression & Recommendations:  Problem # 1:  CHEST WALL PAIN, ANTERIOR (ICD-786.52)  Rest, Ibuprofen with food  Her updated medication list for this problem includes:    Ibuprofen 600 Mg Tabs (Ibuprofen) .Marland Kitchen... 1 tab by mouth every 6 hours as needed for chest pain  Problem # 2:  NECK PAIN, LEFT (ICD-723.1)  MIld--ibuprofen as needed Sounds like having mild muscle twitching as well.  Her updated medication list for this problem includes:    Ibuprofen  600 Mg Tabs (Ibuprofen) .Marland Kitchen... 1 tab by mouth every 6 hours as needed for chest pain  Complete Medication List: 1)  Reclipsen 0.15-30 Mg-mcg Tabs (Desogestrel-ethinyl estradiol) .... Take 1 tab by mouth daily as directed. 2)  Ibuprofen 600 Mg Tabs (Ibuprofen) .Marland Kitchen.. 1 tab by mouth every 6 hours as needed for chest pain  Patient Instructions: 1)  Call if pain worsens or problems breathing. 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       [place], [place]  [address]       Ph: [phone]       Fax: [phone]   RxID:   [id number]    Orders Added: 1)  Est. Patient Level III [27253]   Not Administered:    Influenza Vaccine not given due to: declined

## 2011-03-26 LAB — CBC
HCT: 29.8 % — ABNORMAL LOW (ref 36.0–46.0)
MCHC: 31 g/dL (ref 30.0–36.0)
MCHC: 31 g/dL (ref 30.0–36.0)
MCHC: 31.9 g/dL (ref 30.0–36.0)
MCV: 66.3 fL — ABNORMAL LOW (ref 78.0–100.0)
Platelets: 160 10*3/uL (ref 150–400)
Platelets: 198 10*3/uL (ref 150–400)
RBC: 4.29 MIL/uL (ref 3.87–5.11)
RDW: 20.1 % — ABNORMAL HIGH (ref 11.5–15.5)
RDW: 20.3 % — ABNORMAL HIGH (ref 11.5–15.5)
WBC: 9.2 10*3/uL (ref 4.0–10.5)

## 2011-03-27 LAB — WET PREP, GENITAL
Clue Cells Wet Prep HPF POC: NONE SEEN
Trich, Wet Prep: NONE SEEN
Yeast Wet Prep HPF POC: NONE SEEN

## 2011-03-30 LAB — GLUCOSE, CAPILLARY: Glucose-Capillary: 119 mg/dL — ABNORMAL HIGH (ref 70–99)

## 2011-08-02 ENCOUNTER — Emergency Department (HOSPITAL_COMMUNITY)
Admission: EM | Admit: 2011-08-02 | Discharge: 2011-08-02 | Disposition: A | Discharge status: Home / Self Care | Payer: Self-pay | Attending: Emergency Medicine | Admitting: Emergency Medicine

## 2011-08-02 ENCOUNTER — Emergency Department (HOSPITAL_COMMUNITY): Payer: Self-pay

## 2011-08-02 DIAGNOSIS — R21 Rash and other nonspecific skin eruption: Secondary | ICD-10-CM | POA: Insufficient documentation

## 2011-08-02 DIAGNOSIS — J40 Bronchitis, not specified as acute or chronic: Secondary | ICD-10-CM | POA: Insufficient documentation

## 2011-08-02 DIAGNOSIS — R0602 Shortness of breath: Secondary | ICD-10-CM | POA: Insufficient documentation

## 2011-08-02 DIAGNOSIS — T7840XA Allergy, unspecified, initial encounter: Secondary | ICD-10-CM | POA: Insufficient documentation

## 2011-08-02 DIAGNOSIS — I10 Essential (primary) hypertension: Secondary | ICD-10-CM | POA: Insufficient documentation

## 2011-08-02 LAB — POCT I-STAT, CHEM 8
BUN: 10 mg/dL (ref 6–23)
Calcium, Ion: 1.16 mmol/L (ref 1.12–1.32)
TCO2: 22 mmol/L (ref 0–100)

## 2011-09-29 LAB — CBC
MCHC: 33
MCV: 70.8 — ABNORMAL LOW
Platelets: 217
WBC: 8.3

## 2011-09-29 LAB — RPR: RPR Ser Ql: NONREACTIVE

## 2012-03-24 ENCOUNTER — Emergency Department (HOSPITAL_COMMUNITY)
Admission: EM | Admit: 2012-03-24 | Discharge: 2012-03-24 | Disposition: A | Discharge status: Home / Self Care | Payer: Self-pay | Source: Home / Self Care | Attending: Family Medicine | Admitting: Family Medicine

## 2012-03-24 ENCOUNTER — Encounter (HOSPITAL_COMMUNITY): Payer: Self-pay

## 2012-03-24 DIAGNOSIS — N390 Urinary tract infection, site not specified: Secondary | ICD-10-CM

## 2012-03-24 LAB — POCT URINALYSIS DIP (DEVICE)
Ketones, ur: NEGATIVE mg/dL
Protein, ur: NEGATIVE mg/dL
Urobilinogen, UA: 1 mg/dL (ref 0.0–1.0)

## 2012-03-24 LAB — POCT PREGNANCY, URINE: Preg Test, Ur: NEGATIVE

## 2012-03-24 MED ORDER — CEFTRIAXONE SODIUM 1 G IJ SOLR
1.0000 g | Freq: Once | INTRAMUSCULAR | Status: AC
  Administered 2012-03-24: 1 g via INTRAMUSCULAR

## 2012-03-24 MED ORDER — ONDANSETRON 4 MG PO TBDP
ORAL_TABLET | ORAL | Status: AC
  Filled 2012-03-24: qty 1

## 2012-03-24 MED ORDER — ONDANSETRON 4 MG PO TBDP
4.0000 mg | ORAL_TABLET | Freq: Once | ORAL | Status: AC
  Administered 2012-03-24: 4 mg via ORAL

## 2012-03-24 MED ORDER — ONDANSETRON HCL 4 MG PO TABS: 4.0000 mg | ORAL_TABLET | Freq: Four times a day (QID) | ORAL | Status: AC

## 2012-03-24 MED ORDER — LIDOCAINE HCL (PF) 1 % IJ SOLN
INTRAMUSCULAR | Status: AC
  Filled 2012-03-24: qty 5

## 2012-03-24 MED ORDER — CEFTRIAXONE SODIUM 1 G IJ SOLR
INTRAMUSCULAR | Status: AC
  Filled 2012-03-24: qty 10

## 2012-03-24 MED ORDER — CEPHALEXIN 500 MG PO CAPS: 500.0000 mg | ORAL_CAPSULE | Freq: Four times a day (QID) | ORAL | Status: AC

## 2012-03-24 NOTE — Discharge Instructions (Signed)
Take all of medicine as directed start on thurs, drink lots of fluids, see your doctor if further problems.

## 2012-03-24 NOTE — ED Provider Notes (Signed)
History     CSN: [id number]  Arrival date & time 03/24/12  1558   First MD Initiated Contact with Patient 03/24/12 1614      Chief Complaint  Patient presents with  . Abdominal Pain    (Consider location/radiation/quality/duration/timing/severity/associated sxs/prior treatment) Patient is a 31 y.o. female presenting with abdominal pain. The history is provided by the patient.  Abdominal Pain The primary symptoms of the illness include abdominal pain, nausea and vomiting. The primary symptoms of the illness do not include fever, diarrhea, dysuria, vaginal discharge or vaginal bleeding. The current episode started more than 2 days ago. The onset of the illness was gradual. The problem has been gradually worsening.  The patient states that she believes she is currently not pregnant. Additional symptoms associated with the illness include back pain. Symptoms associated with the illness do not include chills, constipation or urgency.    History reviewed. No pertinent past medical history.  History reviewed. No pertinent past surgical history.  History reviewed. No pertinent family history.  History  Substance Use Topics  . Smoking status: Never Smoker   . Smokeless tobacco: Not on file  . Alcohol Use: No    OB History    Grav Para Term Preterm Abortions TAB SAB Ect Mult Living                  Review of Systems  Constitutional: Negative for fever and chills.  Gastrointestinal: Positive for nausea, vomiting and abdominal pain. Negative for diarrhea and constipation.  Genitourinary: Negative for dysuria, urgency, vaginal bleeding and vaginal discharge.  Musculoskeletal: Positive for back pain.    Allergies  Gelatin  Home Medications   Current Outpatient Rx  Name Route Sig Dispense Refill  . CEPHALEXIN 500 MG PO CAPS Oral Take 1 capsule (500 mg total) by mouth 4 (four) times daily. Take all of medicine and drink lots of fluids 20 capsule 0  . ONDANSETRON HCL 4 MG PO TABS  Oral Take 1 tablet (4 mg total) by mouth every 6 (six) hours. 8 tablet 0    BP 132/75  Pulse 80  Temp(Src) 97.8 F (36.6 C) (Oral)  Resp 18  SpO2 99%  Physical Exam  Nursing note and vitals reviewed. Constitutional: She appears well-developed and well-nourished.  Cardiovascular: Normal rate, regular rhythm, normal heart sounds and intact distal pulses.   Pulmonary/Chest: Breath sounds normal.  Abdominal: Soft. Bowel sounds are normal. She exhibits no distension and no mass. There is tenderness in the epigastric area. There is CVA tenderness. There is no rebound and no guarding.    ED Course  Procedures (including critical care time)  Labs Reviewed  POCT URINALYSIS DIP (DEVICE) - Abnormal; Notable for the following:    Hgb urine dipstick MODERATE (*)    Leukocytes, UA LARGE (*) Biochemical Testing Only. Please order routine urinalysis from main lab if confirmatory testing is needed.   All other components within normal limits  POCT PREGNANCY, URINE  URINE CULTURE   No results found.   1. Complicated UTI (urinary tract infection)       MDM  U/a abnl        Linna Hoff, MD 03/24/12 1807

## 2012-03-24 NOTE — ED Notes (Signed)
C/o upper abdominal area pain since Sunday w vomiting; minimal relief w tylenol

## 2012-04-22 ENCOUNTER — Inpatient Hospital Stay (HOSPITAL_COMMUNITY): Payer: Self-pay

## 2012-04-22 ENCOUNTER — Inpatient Hospital Stay (HOSPITAL_COMMUNITY)
Admission: AD | Admit: 2012-04-22 | Discharge: 2012-04-22 | Disposition: A | Discharge status: Home / Self Care | Payer: Self-pay | Source: Ambulatory Visit | Attending: Obstetrics and Gynecology | Admitting: Obstetrics and Gynecology

## 2012-04-22 ENCOUNTER — Encounter (HOSPITAL_COMMUNITY): Payer: Self-pay | Admitting: *Deleted

## 2012-04-22 DIAGNOSIS — R102 Pelvic and perineal pain: Secondary | ICD-10-CM

## 2012-04-22 DIAGNOSIS — N390 Urinary tract infection, site not specified: Secondary | ICD-10-CM | POA: Insufficient documentation

## 2012-04-22 DIAGNOSIS — N930 Postcoital and contact bleeding: Secondary | ICD-10-CM | POA: Insufficient documentation

## 2012-04-22 DIAGNOSIS — N949 Unspecified condition associated with female genital organs and menstrual cycle: Secondary | ICD-10-CM | POA: Insufficient documentation

## 2012-04-22 DIAGNOSIS — R300 Dysuria: Secondary | ICD-10-CM | POA: Insufficient documentation

## 2012-04-22 LAB — WET PREP, GENITAL

## 2012-04-22 LAB — URINALYSIS, ROUTINE W REFLEX MICROSCOPIC
Bilirubin Urine: NEGATIVE
Ketones, ur: NEGATIVE mg/dL
Nitrite: NEGATIVE
Urobilinogen, UA: 0.2 mg/dL (ref 0.0–1.0)

## 2012-04-22 LAB — POCT PREGNANCY, URINE: Preg Test, Ur: NEGATIVE

## 2012-04-22 LAB — URINE MICROSCOPIC-ADD ON

## 2012-04-22 MED ORDER — SULFAMETHOXAZOLE-TMP DS 800-160 MG PO TABS: 1.0000 | ORAL_TABLET | Freq: Two times a day (BID) | ORAL | Status: AC

## 2012-04-22 MED ORDER — KETOROLAC TROMETHAMINE 60 MG/2ML IM SOLN
60.0000 mg | Freq: Once | INTRAMUSCULAR | Status: AC
  Administered 2012-04-22: 60 mg via INTRAMUSCULAR
  Filled 2012-04-22: qty 2

## 2012-04-22 NOTE — MAU Note (Signed)
RLQ pain, radiates into  Pelvis & back, bleeds with intercourse.  Pain has been present for 1 1/2 months.  Pt thinks this is ovarian pain.

## 2012-04-22 NOTE — Progress Notes (Signed)
[facility] Department of Clinical Social Work Documentation of Interpretation   I assisted ___Judy  RN________________ with interpretation of ____questions__________________ for this patient. 

## 2012-04-22 NOTE — MAU Provider Note (Signed)
History     CSN: [id number]  Arrival date and time: 04/22/12 1124   None     Chief Complaint  Patient presents with  . Abdominal Pain   HPI 31 y.o. [id number] with pelvic pain, dysuria and bleeding after intercourse x 1.5 months. States pain occurs with urination, radiates from front to right lower back. Believes she has a problem with her ovary.    Past Medical History  Diagnosis Date  . No pertinent past medical history     Past Surgical History  Procedure Date  . No past surgeries     History reviewed. No pertinent family history.  History  Substance Use Topics  . Smoking status: Never Smoker   . Smokeless tobacco: Not on file  . Alcohol Use: No    Allergies:  Allergies  Allergen Reactions  . Gelatin Itching  . Nyquil (Pseudoeph-Doxylamine-Dm-Apap) Other (See Comments)    Rash,itching and face swells    No prescriptions prior to admission    Review of Systems  Constitutional: Negative.   Respiratory: Negative.   Cardiovascular: Negative.   Gastrointestinal: Positive for abdominal pain. Negative for nausea, vomiting, diarrhea and constipation.  Genitourinary: Positive for dysuria. Negative for urgency, frequency, hematuria and flank pain.       Negative for vaginal bleeding, vaginal discharge, dyspareunia  Musculoskeletal: Positive for back pain.  Neurological: Negative.   Psychiatric/Behavioral: Negative.    Physical Exam   Blood pressure 122/74, pulse 73, temperature 96.8 F (36 C), temperature source Oral, resp. rate 20, last menstrual period 04/08/2012.  Physical Exam  Nursing note and vitals reviewed. Constitutional: She is oriented to person, place, and time. She appears well-developed and well-nourished. No distress.  HENT:  Head: Normocephalic and atraumatic.  Cardiovascular: Normal rate, regular rhythm and normal heart sounds.   Respiratory: Effort normal and breath sounds normal. No respiratory distress.  GI: Soft. Bowel sounds are  normal. She exhibits no distension and no mass. There is no tenderness. There is no rebound and no guarding.  Genitourinary: There is no rash or lesion on the right labia. There is no rash or lesion on the left labia. Uterus is not deviated, not enlarged, not fixed and not tender. Cervix exhibits no motion tenderness, no discharge and no friability. Right adnexum displays tenderness. Right adnexum displays no mass and no fullness. Left adnexum displays no mass, no tenderness and no fullness. No erythema, tenderness or bleeding around the vagina. No vaginal discharge found.  Musculoskeletal: Normal range of motion.  Neurological: She is alert and oriented to person, place, and time.  Skin: Skin is warm and dry.  Psychiatric: She has a normal mood and affect.    MAU Course  Procedures  Results for orders placed during the hospital encounter of 04/22/12 (from the past 24 hour(s))  URINALYSIS, ROUTINE W REFLEX MICROSCOPIC     Status: Abnormal   Collection Time   04/22/12 11:50 AM      Component Value Range   Color, Urine YELLOW  YELLOW    APPearance CLEAR  CLEAR    Specific Gravity, Urine <1.005 (*) 1.005 - 1.030    pH 7.0  5.0 - 8.0    Glucose, UA NEGATIVE  NEGATIVE (mg/dL)   Hgb urine dipstick NEGATIVE  NEGATIVE    Bilirubin Urine NEGATIVE  NEGATIVE    Ketones, ur NEGATIVE  NEGATIVE (mg/dL)   Protein, ur NEGATIVE  NEGATIVE (mg/dL)   Urobilinogen, UA 0.2  0.0 - 1.0 (mg/dL)   Nitrite NEGATIVE  NEGATIVE    Leukocytes, UA SMALL (*) NEGATIVE   URINE MICROSCOPIC-ADD ON     Status: Abnormal   Collection Time   04/22/12 11:50 AM      Component Value Range   Squamous Epithelial / LPF FEW (*) RARE    WBC, UA 0-2  <3 (WBC/hpf)  WET PREP, GENITAL     Status: Abnormal   Collection Time   04/22/12 11:55 AM      Component Value Range   Yeast Wet Prep HPF POC NONE SEEN  NONE SEEN    Trich, Wet Prep NONE SEEN  NONE SEEN    Clue Cells Wet Prep HPF POC NONE SEEN  NONE SEEN    WBC, Wet Prep HPF POC FEW  (*) NONE SEEN   POCT PREGNANCY, URINE     Status: Normal   Collection Time   04/22/12 11:59 AM      Component Value Range   Preg Test, Ur NEGATIVE  NEGATIVE     US Transvaginal Non-ob  04/22/2012  *RADIOLOGY REPORT*  Clinical Data: Right pelvic pain.  LMP 04/08/2012  TRANSABDOMINAL AND TRANSVAGINAL ULTRASOUND OF PELVIS Technique:  Both transabdominal and transvaginal ultrasound examinations of the pelvis were performed. Transabdominal technique was performed for global imaging of the pelvis including uterus, ovaries, adnexal regions, and pelvic cul-de-sac.  Comparison: None.   It was necessary to proceed with endovaginal exam following the transabdominal exam to visualize the endometrium, myometrium and adnexa.  Findings:  Uterus: Is anteverted and retroflexed and demonstrates a sagittal length of 7.3 cm, depth of 5.1 cm and width of 4.8 cm.  A homogeneous uterine myometrium is seen.  3-D reconstructed images demonstrate a downward V shaped orientation to the fundal aspect of the endometrial canal raising the possibility of an arcuate or septate uterus as the fundus of the uterus appears rounded.  Endometrium: Appears homogeneously echogenic with an AP width of 7.4 mm.  No areas of focal thickening or heterogeneity are seen  Right ovary:  Measures 3.2 x 2.0 x 2.8 cm and has a normal appearance  Left ovary: Measures 3.7 x 2.0 x 1.6 cm and has a normal appearance  Other findings: No pelvic fluid is noted  IMPRESSION: 3-D coronal reconstructed images raise the possibility of a septate versus arcuate uterus.  This can be further assessed with pelvic MRI if desired  Otherwise normal uterine myometrium, endometrium and ovaries.  Original Report Authenticated By: Bertha Stakes, M.D.   US Pelvis Complete  04/22/2012  *RADIOLOGY REPORT*  Clinical Data: Right pelvic pain.  LMP 04/08/2012  TRANSABDOMINAL AND TRANSVAGINAL ULTRASOUND OF PELVIS Technique:  Both transabdominal and transvaginal ultrasound examinations  of the pelvis were performed. Transabdominal technique was performed for global imaging of the pelvis including uterus, ovaries, adnexal regions, and pelvic cul-de-sac.  Comparison: None.   It was necessary to proceed with endovaginal exam following the transabdominal exam to visualize the endometrium, myometrium and adnexa.  Findings:  Uterus: Is anteverted and retroflexed and demonstrates a sagittal length of 7.3 cm, depth of 5.1 cm and width of 4.8 cm.  A homogeneous uterine myometrium is seen.  3-D reconstructed images demonstrate a downward V shaped orientation to the fundal aspect of the endometrial canal raising the possibility of an arcuate or septate uterus as the fundus of the uterus appears rounded.  Endometrium: Appears homogeneously echogenic with an AP width of 7.4 mm.  No areas of focal thickening or heterogeneity are seen  Right ovary:  Measures 3.2 x  2.0 x 2.8 cm and has a normal appearance  Left ovary: Measures 3.7 x 2.0 x 1.6 cm and has a normal appearance  Other findings: No pelvic fluid is noted  IMPRESSION: 3-D coronal reconstructed images raise the possibility of a septate versus arcuate uterus.  This can be further assessed with pelvic MRI if desired  Otherwise normal uterine myometrium, endometrium and ovaries.  Original Report Authenticated By: Bertha Stakes, M.D.    Assessment and Plan  31 y.o. [id number] with dysuria and postcoital spotting U/S and wet prep negative Will treat for UTI with Bactrim Motrin or Tylenol OTC for pain PRN May f/u in GYN clinic if symptoms do not improve  Caraline Deutschman 04/22/2012, 11:49 AM

## 2012-04-23 LAB — GC/CHLAMYDIA PROBE AMP, GENITAL
Chlamydia, DNA Probe: NEGATIVE
GC Probe Amp, Genital: NEGATIVE

## 2012-04-26 NOTE — MAU Provider Note (Signed)
Agree with above note.  Kimberly Avila 04/26/2012 9:43 AM   

## 2012-07-20 ENCOUNTER — Encounter (HOSPITAL_COMMUNITY): Payer: Self-pay | Admitting: Radiology

## 2012-07-20 ENCOUNTER — Inpatient Hospital Stay (HOSPITAL_COMMUNITY)
Admission: AD | Admit: 2012-07-20 | Discharge: 2012-07-20 | Disposition: A | Discharge status: Home / Self Care | Payer: Self-pay | Source: Ambulatory Visit | Attending: Obstetrics & Gynecology | Admitting: Obstetrics & Gynecology

## 2012-07-20 ENCOUNTER — Inpatient Hospital Stay (HOSPITAL_COMMUNITY): Payer: Self-pay

## 2012-07-20 DIAGNOSIS — O9989 Other specified diseases and conditions complicating pregnancy, childbirth and the puerperium: Secondary | ICD-10-CM

## 2012-07-20 DIAGNOSIS — Z3490 Encounter for supervision of normal pregnancy, unspecified, unspecified trimester: Secondary | ICD-10-CM

## 2012-07-20 DIAGNOSIS — M545 Low back pain, unspecified: Secondary | ICD-10-CM | POA: Insufficient documentation

## 2012-07-20 DIAGNOSIS — O26899 Other specified pregnancy related conditions, unspecified trimester: Secondary | ICD-10-CM

## 2012-07-20 DIAGNOSIS — O99891 Other specified diseases and conditions complicating pregnancy: Secondary | ICD-10-CM | POA: Insufficient documentation

## 2012-07-20 DIAGNOSIS — R109 Unspecified abdominal pain: Secondary | ICD-10-CM | POA: Insufficient documentation

## 2012-07-20 LAB — URINALYSIS, ROUTINE W REFLEX MICROSCOPIC
Bilirubin Urine: NEGATIVE
Glucose, UA: NEGATIVE mg/dL
Hgb urine dipstick: NEGATIVE
Ketones, ur: NEGATIVE mg/dL
Nitrite: NEGATIVE
Protein, ur: NEGATIVE mg/dL
Specific Gravity, Urine: 1.015 (ref 1.005–1.030)
Urobilinogen, UA: 1 mg/dL (ref 0.0–1.0)
pH: 7 (ref 5.0–8.0)

## 2012-07-20 LAB — CBC WITH DIFFERENTIAL/PLATELET
Basophils Relative: 0 % (ref 0–1)
Eosinophils Absolute: 0.3 10*3/uL (ref 0.0–0.7)
Eosinophils Relative: 3 % (ref 0–5)
HCT: 37.4 % (ref 36.0–46.0)
Hemoglobin: 13 g/dL (ref 12.0–15.0)
MCH: 29.4 pg (ref 26.0–34.0)
MCHC: 34.8 g/dL (ref 30.0–36.0)
Monocytes Absolute: 0.9 10*3/uL (ref 0.1–1.0)
Monocytes Relative: 9 % (ref 3–12)

## 2012-07-20 LAB — URINE MICROSCOPIC-ADD ON

## 2012-07-20 LAB — ABO/RH: ABO/RH(D): A POS

## 2012-07-20 LAB — WET PREP, GENITAL
Clue Cells Wet Prep HPF POC: NONE SEEN
Trich, Wet Prep: NONE SEEN
Yeast Wet Prep HPF POC: NONE SEEN

## 2012-07-20 LAB — POCT PREGNANCY, URINE: Preg Test, Ur: POSITIVE — AB

## 2012-07-20 NOTE — MAU Provider Note (Signed)
Attestation of Attending Supervision of Advanced Practitioner (CNM/NP): Evaluation and management procedures were performed by the Advanced Practitioner under my supervision and collaboration.  I have reviewed the Advanced Practitioner's note and chart, and I agree with the management and plan.  HARRAWAY-SMITH, Cristhian Vanhook 9:10 PM     

## 2012-07-20 NOTE — MAU Provider Note (Signed)
History     CSN: [id number]  Arrival date and time: 07/20/12 1520   First Provider Initiated Contact with Patient 07/20/12 1835      Chief Complaint  Patient presents with  . Possible Pregnancy  . Abdominal Pain   HPIMARIA G Avila is 31 year old hispanic female who presents with lower abdominal pain, lower back pain that increases with walking and vaginal discharge with odor.  [id number].   LMP 05/14/12.  Sexually active with 1 partner-husband.  Bled with intercourse on 06/24/12.  Plans prenatal care [employer] program.       No past medical history on file.  No past surgical history on file.  No family history on file.  History  Substance Use Topics  . Smoking status: Not on file  . Smokeless tobacco: Not on file  . Alcohol Use: Not on file    Allergies:  Allergies  Allergen Reactions  . Nyquil (Pseudoeph-Doxylamine-Dm-Apap) Hives    Prescriptions prior to admission  Medication Sig Dispense Refill  . acetaminophen (TYLENOL) 500 MG tablet Take 500 mg by mouth every 6 (six) hours as needed. For pain or headache        Review of Systems  Constitutional: Positive for chills. Negative for fever.  Respiratory: Negative.   Cardiovascular: Negative.   Gastrointestinal: Positive for abdominal pain. Negative for nausea and vomiting.  Genitourinary:       + for malodorous discharge.  Hx of bleeding with intercourse  Musculoskeletal: Positive for back pain.   Physical Exam   Blood pressure 117/59, pulse 61, temperature 98.4 F (36.9 C), temperature source Oral, resp. rate 16, height 5\' 1"  (1.549 m), weight 70.126 kg (154 lb 9.6 oz), last menstrual period 05/14/2012, SpO2 100.00%.  Physical Exam  Constitutional: She is oriented to person, place, and time. She appears well-developed and well-nourished. No distress.  HENT:  Head: Normocephalic.  Neck: Normal range of motion.  Cardiovascular: Normal rate.   Respiratory: Effort normal.  Neurological: She is alert and  oriented to person, place, and time.  Skin: Skin is warm and dry.  Psychiatric: She has a normal mood and affect. Her behavior is normal.   Results for orders placed during the hospital encounter of 07/20/12 (from the past 24 hour(s))  URINALYSIS, ROUTINE W REFLEX MICROSCOPIC     Status: Abnormal   Collection Time   07/20/12  4:10 PM      Component Value Range   Color, Urine YELLOW  YELLOW   APPearance CLEAR  CLEAR   Specific Gravity, Urine 1.015  1.005 - 1.030   pH 7.0  5.0 - 8.0   Glucose, UA NEGATIVE  NEGATIVE mg/dL   Hgb urine dipstick NEGATIVE  NEGATIVE   Bilirubin Urine NEGATIVE  NEGATIVE   Ketones, ur NEGATIVE  NEGATIVE mg/dL   Protein, ur NEGATIVE  NEGATIVE mg/dL   Urobilinogen, UA 1.0  0.0 - 1.0 mg/dL   Nitrite NEGATIVE  NEGATIVE   Leukocytes, UA SMALL (*) NEGATIVE  URINE MICROSCOPIC-ADD ON     Status: Abnormal   Collection Time   07/20/12  4:10 PM      Component Value Range   Squamous Epithelial / LPF RARE  RARE   WBC, UA 0-2  <3 WBC/hpf   RBC / HPF 0-2  <3 RBC/hpf   Bacteria, UA FEW (*) RARE  POCT PREGNANCY, URINE     Status: Abnormal   Collection Time   07/20/12  4:26 PM      Component Value  Range   Preg Test, Ur POSITIVE (*) NEGATIVE  WET PREP, GENITAL     Status: Abnormal   Collection Time   07/20/12  6:50 PM      Component Value Range   Yeast Wet Prep HPF POC NONE SEEN  NONE SEEN   Trich, Wet Prep NONE SEEN  NONE SEEN   Clue Cells Wet Prep HPF POC NONE SEEN  NONE SEEN   WBC, Wet Prep HPF POC MANY (*) NONE SEEN  CBC WITH DIFFERENTIAL     Status: Normal   Collection Time   07/20/12  6:51 PM      Component Value Range   WBC 10.0  4.0 - 10.5 K/uL   RBC 4.42  3.87 - 5.11 MIL/uL   Hemoglobin 13.0  12.0 - 15.0 g/dL   HCT 69.6  29.5 - 28.4 %   MCV 84.6  78.0 - 100.0 fL   MCH 29.4  26.0 - 34.0 pg   MCHC 34.8  30.0 - 36.0 g/dL   RDW 13.2  44.0 - 10.2 %   Platelets 236  150 - 400 K/uL   Neutrophils Relative 58  43 - 77 %   Neutro Abs 5.8  1.7 - 7.7 K/uL    Lymphocytes Relative 30  12 - 46 %   Lymphs Abs 3.1  0.7 - 4.0 K/uL   Monocytes Relative 9  3 - 12 %   Monocytes Absolute 0.9  0.1 - 1.0 K/uL   Eosinophils Relative 3  0 - 5 %   Eosinophils Absolute 0.3  0.0 - 0.7 K/uL   Basophils Relative 0  0 - 1 %   Basophils Absolute 0.0  0.0 - 0.1 K/uL  ABO/RH     Status: Normal (Preliminary result)   Collection Time   07/20/12  6:51 PM      Component Value Range   ABO/RH(D) A POS       MAU Course  Procedures GC/CHL culture to lab  MDM Reviewed labs and ultrasound report with the patient.   Assessment and Plan  A:  Viable intrauterine pregnancy at [redacted]w[redacted]d gestation     Abdominal pain.   P:  May take tylenol as needed for discomfort     Begin prenatal care  Aahan Marques,EVE M 07/20/2012, 7:56 PM

## 2012-07-20 NOTE — MAU Note (Signed)
Patient states she had a positive home pregnancy test on 7-1. States she has been having a lot of abdominal and back pain since her child was in an accident on 7-4 when she was very scared. Had spotting with intercourse on 7-27. Vaginal discharge with a bad odor for 15 days.

## 2012-07-20 NOTE — MAU Note (Signed)
Patient is in with c/o lower back, abdominal pain and vaginal discharge (yellow and odorous) since July 4th. She called the health dept to make an appt but they wont give her until she comes here. She has pain with intercourse. Denies bleeding today.

## 2012-07-21 ENCOUNTER — Encounter (HOSPITAL_COMMUNITY): Payer: Self-pay | Admitting: Radiology

## 2012-08-30 ENCOUNTER — Other Ambulatory Visit: Payer: Self-pay

## 2012-08-30 DIAGNOSIS — Z331 Pregnant state, incidental: Secondary | ICD-10-CM

## 2012-08-30 LAB — HIV ANTIBODY (ROUTINE TESTING W REFLEX): HIV: NONREACTIVE

## 2012-08-30 NOTE — Progress Notes (Signed)
PRENATAL LABS DONE TODAY Kimberly Avila 

## 2012-08-31 LAB — OBSTETRIC PANEL
Eosinophils Absolute: 0.1 10*3/uL (ref 0.0–0.7)
Eosinophils Relative: 1 % (ref 0–5)
Lymphs Abs: 2 10*3/uL (ref 0.7–4.0)
MCH: 29.7 pg (ref 26.0–34.0)
MCHC: 35 g/dL (ref 30.0–36.0)
MCV: 85 fL (ref 78.0–100.0)
Platelets: 232 10*3/uL (ref 150–400)
RDW: 14 % (ref 11.5–15.5)

## 2012-08-31 LAB — SICKLE CELL SCREEN: Sickle Cell Screen: NEGATIVE

## 2012-09-01 LAB — CULTURE, OB URINE

## 2012-09-06 ENCOUNTER — Encounter: Payer: Self-pay | Admitting: Family Medicine

## 2012-09-06 ENCOUNTER — Ambulatory Visit (INDEPENDENT_AMBULATORY_CARE_PROVIDER_SITE_OTHER): Payer: Self-pay | Admitting: Family Medicine

## 2012-09-06 VITALS — BP 100/65 | HR 88 | Temp 98.0°F | Wt 148.2 lb

## 2012-09-06 DIAGNOSIS — K602 Anal fissure, unspecified: Secondary | ICD-10-CM

## 2012-09-06 DIAGNOSIS — O26899 Other specified pregnancy related conditions, unspecified trimester: Secondary | ICD-10-CM

## 2012-09-06 DIAGNOSIS — N760 Acute vaginitis: Secondary | ICD-10-CM

## 2012-09-06 DIAGNOSIS — R300 Dysuria: Secondary | ICD-10-CM

## 2012-09-06 DIAGNOSIS — N949 Unspecified condition associated with female genital organs and menstrual cycle: Secondary | ICD-10-CM

## 2012-09-06 LAB — POCT URINALYSIS DIPSTICK
Blood, UA: NEGATIVE
Glucose, UA: NEGATIVE
Spec Grav, UA: >=1.03
Urobilinogen, UA: 0.2

## 2012-09-06 LAB — POCT WET PREP (WET MOUNT): WBC, Wet Prep HPF POC: >20

## 2012-09-06 LAB — POCT UA - MICROSCOPIC ONLY

## 2012-09-06 MED ORDER — DOCUSATE SODIUM 100 MG PO CAPS: 100.0000 mg | ORAL_CAPSULE | Freq: Two times a day (BID) | ORAL | Status: DC | PRN

## 2012-09-06 NOTE — Assessment & Plan Note (Signed)
Check urine culture with pain radiating to pelvis, but most likely this is from anal fissure since pain is reproduced with anoscope today.

## 2012-09-06 NOTE — Patient Instructions (Addendum)
You seem to have a small tear in anus causing pain. Take colace for stool softener.  Eat extra fiber in your food (beans, vegetables, whole grains). Drink plenty of water to keep stool soft. Use sitz baths to soak the area. If you have bleeding or worsened symptoms then return to doctor.  Fisura anal - Adultos  (Anal Fissure, Adult)  Una fisura anal es una pequea lesin o grieta en la piel que rodea el (ano).El sangrado debido a una fisura anal generalmente se detiene por s slo luego de algunos minutos. Puede ocurrir cada vez que mueve el intestino hasta que la fisura se cure. CUIDADOS EN EL HOGAR   Consuma gran cantidad de frutas, granos enteros y vegetales. Evite alimentos constipantes como bananas y productos lcteos. Estos alimentos hacen que la materia fecal sea dura.   Tome un bao de agua tibia (bao de asiento) segn las indicaciones del mdico.   Albesa Seen gran cantidad de lquido para mantener la orina de tono claro o color amarillo plido.   Tome slo los medicamentos que le haya indicado el mdico. No tome aspirina.   No use cremas con anestesia ni con hidrocortisona. Estas cremas pueden demorar la curacin.  SOLICITE AYUDA DE INMEDIATO SI:   La fisura no se cura luego de 3 das.   Tiene ms sangrado.   Tiene fiebre.   Tiene (diarrea) mezclada con Montez Hageman.   Siente dolor.   Siente que empeora o que no mejora.  ASEGRESE DE QUE:   Comprende estas instrucciones.   Controlar su enfermedad.   Solicitar ayuda de inmediato si no mejora o si empeora.  Document Released: 08/06/2011 Document Revised: 11/27/2011 [facility] Patient Information 2012 [place], [place].

## 2012-09-06 NOTE — Progress Notes (Signed)
  Subjective:    Patient ID: Kimberly Avila, female    DOB: Jan 12, 1981, 31 y.o.   MRN: [id number]  HPI  1. Rectal pain. For the past several days, patient complains of sharp pain with defecation that radiates to her pelvic area. The pain resolves after having a bowel movement. Having regular daily bowel movements, without straining. Endorses a poor appetite in may have lost a few pounds recently. She has noted mild vaginal discharge and had negative cervical cultures on July 30. She is actually active with her husband only, denies any pain with intercourse. States she does feel safe at home in her current relationship and is taking care of her other 4 small children without any problems.  Has history of multiple negative Pap smears. She is 15.[redacted] weeks pregnant by first trimester ultrasound. Her new OB visit is scheduled for next week. She denies any dysuria, fever, chills, pelvic pain.  Review of Systems See HPI otherwise negative.  reports that she has never smoked. She does not have any smokeless tobacco history on file.    Objective:   Physical Exam  Vitals reviewed. Constitutional: She is oriented to person, place, and time. She appears well-developed and well-nourished. No distress.  HENT:  Head: Normocephalic and atraumatic.  Neck: Neck supple.  Cardiovascular: Normal rate, regular rhythm and normal heart sounds.   Pulmonary/Chest: Effort normal and breath sounds normal. No respiratory distress. She has no wheezes. She has no rales.  Abdominal: Soft. Bowel sounds are normal. She exhibits no distension. There is no tenderness. There is no rebound and no guarding.  Genitourinary: Vaginal discharge found.       White discharge. Cervix wnl. No CMT.  Anoscope exam: small 1cm anal fissure at 6 oclock position. No hemorrhoids noted. Normal external exam. This exam reproduces her pain.  Neurological: She is alert and oriented to person, place, and time.  Skin: No rash noted.    Psychiatric: She has a normal mood and affect.       Assessment & Plan:

## 2012-09-06 NOTE — Assessment & Plan Note (Signed)
Small anal fissure, recommend conservative management with sitz baths and stool softeners. Patient to followup for new OB visit in one week to establish care. Reviewed prenatal labs and urine culture without predominant organism. Patient had urinalysis drawn on arrival today, but does not seem consistent with UTI. Will send for repeat culture. Followup one week or sooner if symptoms do not improve or worsen

## 2012-09-08 LAB — CULTURE, OB URINE: Organism ID, Bacteria: NO GROWTH

## 2012-09-15 ENCOUNTER — Ambulatory Visit (INDEPENDENT_AMBULATORY_CARE_PROVIDER_SITE_OTHER): Payer: Self-pay | Admitting: Family Medicine

## 2012-09-15 ENCOUNTER — Encounter: Payer: Self-pay | Admitting: Family Medicine

## 2012-09-15 VITALS — BP 113/73 | Temp 98.7°F | Wt 150.2 lb

## 2012-09-15 DIAGNOSIS — Z3490 Encounter for supervision of normal pregnancy, unspecified, unspecified trimester: Secondary | ICD-10-CM

## 2012-09-15 DIAGNOSIS — K602 Anal fissure, unspecified: Secondary | ICD-10-CM

## 2012-09-15 DIAGNOSIS — Z3480 Encounter for supervision of other normal pregnancy, unspecified trimester: Secondary | ICD-10-CM

## 2012-09-15 DIAGNOSIS — Z23 Encounter for immunization: Secondary | ICD-10-CM

## 2012-09-15 LAB — GLUCOSE, CAPILLARY
Comment 1: 1
Glucose-Capillary: 167 mg/dL — ABNORMAL HIGH (ref 70–99)

## 2012-09-15 NOTE — Progress Notes (Signed)
Subjective:    Kimberly Avila is being seen today for her first obstetrical visit.  This is a planned pregnancy. She is at [redacted]w[redacted]d gestation. Her obstetrical history is significant for prior GDM. Relationship with FOB: spouse, living together. Patient does intend to breast feed. Pregnancy history fully reviewed.  Patient is having problems with pelvic/low abd discomfort esp with intercourse.  No contractions.  No bleeding.  She also has some feelings of vaginal fullness and pressure.  This has been present for the last several weeks.  Menstrual History: OB History    Grav Para Term Preterm Abortions TAB SAB Ect Mult Living   5 4 4             Patient's last menstrual period was 05/14/2012.    The following portions of the patient's history were reviewed and updated as appropriate: allergies, current medications, past family history, past medical history, past social history, past surgical history and problem list.  Review of Systems Pertinent items are noted in HPI.    Objective:    BP 113/73  Temp 98.7 F (37.1 C)  Wt 150 lb 3.2 oz (68.13 kg)  LMP 05/14/2012  Breastfeeding? Unknown  General Appearance:    Alert, cooperative, no distress, appears stated age  Head:    Normocephalic, without obvious abnormality, atraumatic  Eyes:    PERRL, conjunctiva/corneas clear, EOM's intact  Throat:   Lips, mucosa, and tongue normal; teeth and gums normal  Neck:   Supple, symmetrical, trachea midline, no adenopathy;   Back:     Symmetric, no curvature, ROM normal, no CVA tenderness  Lungs:     Clear to auscultation bilaterally, respirations unlabored   Heart:    Regular rate and rhythm, S1 and S2 normal, no murmur, rub   or gallop  Abdomen:     Soft, non-tender, gravid  Genitalia:    Not performed (not indicated at this time)  Extremities:   Extremities normal, atraumatic, no cyanosis or edema  Skin:   Skin color, texture, turgor normal, no rashes or lesions      Assessment:    Pregnancy at 17 and 1/7 weeks    Plan:    Initial labs drawn. Prenatal vitamins. Problem list reviewed and updated. AFP3 discussed: declined. Role of ultrasound in pregnancy discussed; fetal survey: requested. Amniocentesis discussed: not indicated. Follow up in 4 weeks. 50% of 60 min visit spent on counseling and coordination of care.

## 2012-09-15 NOTE — Patient Instructions (Signed)
El ABC del Psychiatrist (ABCs of Pregnancy) A A La atencin antes del parto es muy importante. Asegrese de Science writer a su mdico y recibir atencin prenatal tan rpido como usted crea que est embarazada. En este momento, se la controlar por posibles infecciones, anormalidades genticas y problemas potenciales. Este es el momento para discutir la dieta, el ejercicio, el [place], los medicamentos, el [place], los medicamentos para el dolor durante el parto y la posibilidad de un parto por Copy. Haga todas las preguntas que puedan preocuparla. Es importante que consulte a su mdico con regularidad durante todo Firefighter. Evite el contacto con sustancias txicas y productos qumicos, como disolventes de limpieza, plomo y mercurio, algunos insecticidas y pinturas. Las mujeres embarazadas deben evitar la exposicin a los vapores de [place], y los humos que la hagan sentir enferma, mareada o dbil. Cuando sea posible, tenga una consulta con su mdico antes del embarazo para recibir Optometrist pudiera sugerir, como tomar cido flico, Radio producer ejercicios, dejar de fumar, evitar las bebidas alcohlicas, etc. B La lactancia materna es la opcin ms saludable para usted y su beb. Tiene muchos beneficios nutricionales para al beb y [address] Tambin crea un vnculo muy estrecho entre el beb y [place]. Hable con su mdico, su familia, sus amigos y su empleador acerca de cmo usted decidir alimentar a su beb y como pueden ayudarle a decidir. No todos los defectos congnitos pueden ser evitados, pero una mujer puede tomar decisiones para aumentar las posibilidades de tener un beb sano. Muchos defectos congnitos ocurren al principio del Psychiatrist, a veces antes de que la mujer sepa que est [place]. Los defectos o anormalidades congnitas de cualquier nio de su familia o la del padre deben ser comentados con su mdico. Obtener un buen sostn para los cambios del tamao de las [place]. selo en especial  cuando hace ejercicios o cuando amamante.   C Festeje la noticia de su embarazo con su Cnyuge/padre y su familia. Las Clases de parto son tiles para usted y Product/process development scientist cnyuge/padre, porque le ayudan a entender lo que sucede durante el Psychiatrist y [place]. El parto por cesrea se debe discutir con el mdico para estar preparado para esa posibilidad. Las ventajas y las desventajas de la [place] si es un nio, se deben comentar con Presenter, broadcasting. El tabaquismo durante el embarazo puede dar como resultado bebs con bajo peso al nacer. Se ha asociado con la infertilidad, abortos espontneos, embarazos ectpicos, mortalidad infantil y Insurance underwriter salud (morbilidad) en la infancia. Adems, el tabaquismo puede causar problemas de aprendizaje a largo plazo. Si usted fuma, debe tratar de dejar de fumar antes de [place] y no durante el embarazo. El humo secundario tambin puede hacerles dao a la mam y a su beb en desarrollo. Es Neomia Dear buena idea pedirle a la gente que deje de fumar a su alrededor Academic librarian y despus del nacimiento del beb. El [place] extra es necesario durante el Psychiatrist y se Occupational psychologist en las vitaminas prenatales, en los productos lcteos, vegetales de hojas verdes y en los suplementos de calcio. D Una Dieta saludable de acuerdo a su peso y su talla actual, junto con las vitaminas y suplementos minerales debe ser discutida con su mdico. El abuso /o la violencia Domstica deben darse a conocer inmediatamente para corregir la situacin. Tome ms agua cuando haga ejercicios para mantenerse hidratada. Por lo general las molestias de la espalda y las piernas aparecen y avanzan a Glass blower/designer de mediados del  segundo trimestre hasta el nacimiento del beb. Eso se debe al aumento de tamao del beb y del tero, que tambin puede afectar su equilibro. No consuma Drogas. Las drogas ilegales pueden daar seriamente al beb y a usted. Beba ms lquidos (lo mejor es el agua) Durante todo el embarazo para  ayudar a su cuerpo a Radio producer aumento del volumen sanguneo. Beba por lo menos 6 a 8 vasos de [place], jugo de frutas o [employer]. Una buena manera de saber que est bebiendo suficiente lquido es cuando la [place] se ve casi como el agua clara o de color amarillo muy claro.   E Coma alimentos sanos para obtener los nutrientes que usted y su beb necesitan al Psychologist, clinical. Sus alimentos deben Johnson [employer] Johnson cinco grupos bsicos. El Ejercicio (30 minutos de ejercicio leve a moderado al da) es importante y se aconseja durante el [place], si no tiene problemas de [place]. Si el ejercicio le causa malestar o mareos debe interrumpirlo e informar a su mdico. Las emociones durante el embarazo pueden pasar de la euforia a la depresin y deben ser comprendidos por usted, su pareja y su familia. F La evaluacin fetal con ecografas, la amniocentesis y los controles durante el [place] y [place] parto son algo habitual y a Research officer, political party. Tome 400 mg. de cido Southern Company, si es posible antes y Energy Transfer Partners primeros meses del Psychiatrist para reducir el riesgo de defectos congnitos del cerebro y la columna vertebral. Todas las mujeres que podran quedar embarazadas deben tomar una vitamina con cido flico todos [place]. Tambin es importante seguir una dieta saludable con alimentos enriquecidos (cereales enriquecidos, arroz, panes y pastas) y alimentos que sean fuentes naturales de cido flico (jugo de naranja, vegetales de hojas verdes, frijoles, man, brcoli, esprragos, arvejas y Therapist, occupational). El padre debe involucrarse en todos los aspectos del embarazo incluyendo el cuidado prenatal, las clases de [place], [place] parto y el tiempo despus del [place]. Los padres tambin pueden tener problemas emocionales como ser [place], [place] econmicos y llevar adelante [place]. G Las pruebas Genticas se deben Landscape architect. Es Secretary/administrator la historia de su familia y la del padre. Si ha habido problemas con embarazos o  defectos de nacimiento en su familia, informe de inmediato a su mdico. Adems, los consejeros genticos pueden hablar con usted acerca de la informacin que pueda necesitar en la toma de decisiones acerca de tener una familia. Usted puede llamar a un centro mdico en su rea para ayudar en la bsqueda de un consejero gentico certificado por el consejo. La asesora y prueba gentica se deben hacer antes del embarazo siempre que sea posible, especialmente si hay antecedentes de problemas en la familia del padre o de la [place]. Ciertos grupos tnicos tienen un mayor riesgo de defectos genticos. H Familiarcese con [facility] en el que va a tener su beb. Conozca cunto tiempo se tarda en llegar, la sala de preparto y nacimiento y los procedimientos del hospital. Asegrese de que su seguro mdico es aceptado en ese Environmental consultant. Haga que su Hogar est listo para el beb, incluyendo la ropa, la habitacin del beb (cuando sea posible), muebles y asientos del automvil. El lavado de manos es importante durante todo el da, especialmente despus de tocar carne cruda y aves de corral, de cambiar el paal del beb y de ir al bao. Esto puede ayudarle a Geologist, engineering de bacterias y virus que causan infecciones. Su pelo puede volverse seco y ms  fino, pero volver a la normalidad unas semanas despus de que nazca el beb. La acidez es un problema comn que puede ser tratado tomando anticidos recetados por su mdico, comiendo alimentos en porciones ms pequeas 5  6 veces por da, no beber lquidos al comer, beber entre comidas y elevar la cabecera de su cama 2  3 pulgadas. I El seguro que le d cobertura a usted y al beb, a los gastos del mdico y del hospital debe ser revisado, para saber con anticipacin qu gastos no cubiertos por su plan de seguro deber pagar usted. Si no tiene seguro mdico, por lo general hay clnicas y servicios disponibles para usted en su comunidad. Tome 30 mg. de hierro durante el  Big Lots segn lo prescrito por su mdico para reducir el riesgo de niveles bajos de glbulos rojos (anemia) mas adelante en el embarazo. Todas las mujeres en edad frtil deben consumir una dieta rica en hierro. J La madre, el padre y los otros hijos deben hacer un esfuerzo conjunto para adaptarse al [employer] Lots en el aspecto econmico, emocional y psicolgico durante Firefighter. nase a un grupo de apoyo para las futuras [place]. O bien, vaya a clases de crianza de hijos o del parto. La familia tiene que participar cuando sea posible. K Conozca sus lmites. Infrmele a su mdico si experimenta:    Cualquier tipo de dolor.   Clicos fuertes.   Aumenta de peso en un corto perodo de tiempo (5 libras en 3 a 5 das).   Hemorragia vaginal, prdida de lquido amnitico.   Dolor de [place], problemas de visin.   Mareos, Newell [employer]bermaid, le falta el aire.   Dolor en el pecho.   Fiebre de 102 F (38.9 C) o ms.   Elimina lquido claro por la vagina.   Dolor al Beatrix Shipper.   Violencia familiar.   Latidos irregulares del corazn (palpitaciones).   Latidos rpidos del corazn (taquicardia).   Siente ganas de vomitar (nuseas) y vmitos.   Dificultad para caminar, retencin de lquidos (edema).   Debilidad muscular.   Si su beb tiene disminucin de [place].   Diarrea persistente.   Flujo vaginal anormal.   Contracciones uterinas en intervalos de 20 minutos.   Dolor de espalda que baja por la pierna.  L Aprenda y practique que, Lo que usted come y bebe debe ser con moderacin y sano para usted y su beb. Las drogas PepsiCo el alcohol y la cafena son temas importantes para las mujeres [place]. No hay una cantidad segura de alcohol que una mujer pueda beber durante el [place]. El sndrome de alcoholismo fetal, un trastorno que se caracteriza por retraso del crecimiento, anormalidades faciales y disfuncin del sistema nervioso central, es causado por el uso de alcohol de la mujer  durante el Campus. La cafena, que se encuentra en el t, caf, refrescos y chocolate, tambin deben ser limitados. Asegrese de leer las etiquetas cuando se trata de reducir el consumo de cafena durante el [place]. Ms de 200 alimentos, bebidas y medicamentos sin receta contienen cafena y tienen un ato contenido de sal! Hay cafs y tes que no contienen cafena.   M Las enfermedades mdicas tales como diabetes, epilepsia e hipertensin arterial deben ser tratados y mantenidos bajo control antes del embarazo siempre que sea posible, pero especialmente durante el [place]. Consulte con su mdico acerca de cualquier medicamento que pueda ser necesario cambiar o ajustar la dosis. Si est tomando algn medicamento, consulte con su mdico sobre su seguridad para  utilizar SLM Corporation o antes de quedar [place], cuando sea posible. Tambin, asegrese de informar todas las hierbas o vitaminas que est tomando. Tambin se trata de medicamentos! Hable con su mdico sobre The Mutual of Omaha, con receta y de venta libre que est tomando. Durante su visita prenatal, discuta los Chesapeake Energy su mdico le puede dar durante el parto y el nacimiento. N Nunca tenga miedo de preguntar a su mdico acerca de su salud, el progreso del [place], problemas familiares, situaciones de estrs y pedirle la recomendacin de un pediatra, si no tiene uno. Es mejor tomar todas las precauciones y Administrator, Civil Service pregunta o preocupacin que usted tenga durante las visitas a su consultorio. Es Neomia Dear buena idea escribir sus preguntas antes de visitar al American Express. O Los medicamentos de [place] para tos y resfriados pueden contener alcohol u otros ingredientes que se deben Theatre manager. Consulte con su mdico sobre los Express Scripts, hierbas o medicamentos de venta libre que est tomando o puede tomar durante el Psychiatrist.   P La actividad fsica durante el embarazo puede beneficiarla tanto a  usted y a su beb al disminuir la incomodidad y la fatiga produciendo una sensacin de [place], y el aumento de la probabilidad de una pronta recuperacin despus del parto. El ejercicio ligero a moderado Academic librarian fortalece el vientre (abdomen) y msculos de la espalda. Esto le ayuda a Banker. Practicar yoga, caminar, nadar y montar en una bicicleta fija son por lo general ejercicios seguros para las mujeres embarazadas. Evite el buceo, ejercicios de gran altura (ms de 3000 pies), esqu, paseos a caballo, deportes de contacto, etc. Consulte siempre con su mdico antes de comenzar cualquier tipo de ejercicio, Especialmente durante el embarazo y, especialmente si no hacan ejercicios antes de quedar embarazada. Q Las nuseas y Dentist estomacal son comunes durante el Psychiatrist. Coma un par de galletitas o tostadas secas antes de levantarse de la cama. Los alimentos que normalmente le gustan pueden hacerla sentir mal del [place]. Puede que tenga que sustituir otros alimentos nutritivos. Comer cinco o seis porciones pequeas al Geophysical data processor de tres grandes pueden hacer que usted se sienta mejor. No beba con sus alimentos, beba Charter Communications. Las preguntas Que usted tiene deben estar escritas y consultadas durante sus visitas prenatales. R Lea y haga planes para que su casa sea segura para el beb. Hay consejos importantes para que su hogar sea un ambiente ms seguro para su beb. Revise los consejos y haga su hogar ms seguro para usted y su beb. Lea las etiquetas de los alimentos con respecto a las caloras, sal y Owens[employer]Illinois. S Las salas de saunas, baeras de [place] caliente y vapor deben evitarse durante Firefighter. El calor excesivo puede ser perjudicial durante el [place]. Su mdico la examinar para Hotel manager de transmisin sexual y trastornos genticos durante las visitas prenatales. Aprenda los Signos del [place] de [place]. Las relaciones Sexuales  durante el Psychiatrist son seguras a menos que haya un problema mdico o del Psychiatrist y su mdico le recomiende evitarlas. T Se debe evitar viajar largas distancias, especialmente en el tercer trimestre de su embarazo. Si tiene que viajar afuera del [place], asegrese de llevar una copia de sus registros mdicos y un plan de seguro mdico. Usted no debe recorrer largas distancias sin consultar con su mdico primero. La mayora de las campaas areas no permiten viajar despus de 36 semanas de embarazo. La Toxoplasmosis es [place]  infeccin causada por un parsito que puede daar seriamente al beb nonato. Evite comer carne poco cocida y tocar la arena higinica para gatos. Asegrese de usar guantes para la Automotive engineer. El hormigueo de las manos y los dedos no es inusual y se debe a la retencin de lquidos. Esto desaparece despus del nacimiento del beb. U Aumenta el tamao del tero durante Designer, fashion/clothing. Sus riones comenzarn a funcionar ms eficientemente. Esto puede hacer que Usted sienta la necesidad de orinar ms a menudo. Tambin puede tener fugas de orina al estornudar, toser o rer. Esto se debe a que el crecimiento del tero presiona la vejiga, que se encuentra directamente adelante y ligeramente por debajo del tero durante los primeros meses del [place]. Si experimenta ardor con frecuencia al Beatrix Shipper o en la orina presenta sangre, asegrese de decirle a su mdico. El tamao de su tero en el tercer trimestre puede causar un problema con el equilibro. Es recomendable mantener una buena postura y Automotive engineer usar tacones altos durante ese tiempo. Un Ultrasonido de su beb puede ser necesario durante el embarazo y es seguro para usted y su beb. V Las vacunas son Neomia Dear preocupacin importante para las mujeres embarazadas. Pngase las vacunas necesarias antes del embarazo. El centro para el control de enfermedades ([email or web address]) tiene directrices claras para el uso de vacunas durante el [place]. Revise la  lista, augrese de Science writer con su mdico. Las Vitaminas prenatales son tiles y saludables para usted y el beb. No tome otras vitaminas, excepto lo que se recomiende. Tomar algunas vitaminas en exceso puede causar problemas de sobredosis. Los vmitos continuos deben ser reportados a su mdico. Las venas Varicosas pueden aparecer sobre todo si hay antecedentes familiares. Deben desaparecer despus del nacimiento del beb. Usar calzas ayuda si no le producen molestias. W Tener sobrepeso o bajo peso durante el embarazo puede causarle problemas. Trate de estar en un rango de 15 libras de su peso ideal antes del Psychiatrist. Recuerde, el embarazo no es el momento para estar a dieta! No deje de comer o saltee las comidas si aumenta de [place]. Tanto usted como su beb necesitan las caloras y la nutricin que recibe de una dieta saludable. Asegrese de Science writer con su mdico acerca de su dieta. Hay un plan de frmula y dieta disponibles en funcin de si tiene sobrepeso o bajo peso. Su mdico o nutricionista puede ayudarle y asesorarle si es necesario. X Evite los rayos X. Si usted tiene que hacerse un tratamiento dental o pruebas diagnsticas, informe a su dentista o mdico que est embarazada para eventualmente tomar cuidados extra. Los rayos X slo se deben tomar [employer] de no tomarlos son mayores que el riesgo de tomarlos. Si es necesario, slo una cantidad mnima de radiacin debe ser [place]. Cuando los rayos X son necesarios, se deben usar los escudos protectores de plomo para cubrir las reas del cuerpo que no necesiten visualizarse en la radiografa. Y Su beb la ama. Amamantar a su beb crea un vnculo amoroso y Exxon Mobil Corporation. Dle a su beb un medio ambiente sano para vivir durante el embarazo. Los bebs y nios requieren un cuidado y [place] orientacin constante. Su salud y su seguridad deben ser vigiladas cuidadosamente en todo momento. Despus de que nazca el beb, descanse o  duerma una siesta cuando el beb est durmiendo. Z Consiga descansar. Asegrese de obtener suficiente descanso. Descanse de lado tan a menudo como sea posible, especialmente se aconseja el lado izquierdo. Proporciona la  mejor circulacin para su beb y [place] a reducir la hinchazn. Trate de tomar una siesta de treinta a cuarenta y cinco minutos en la tarde cuando sea posible. Despus de que nazca el beb, descanse o duerma una siesta cuando el beb est durmiendo. Trate de elevar los pies cuando le sea posible. Ayuda a la circulacin en las piernas y a Building services engineer. La mayora de informacin es de cortesa de CDC. Document Released: 10/05/2007 Document Revised: 11/27/2011 [facility] Patient Information 2012 [place], [place].Dolor abdominal en el embarazo   (Abdominal Pain During Pregnancy) Las molestias abdominales son frecuentes Academic librarian. Generalmente no causan ningn dao. Puede tener numerosas causas. Algunas causas son ms graves que otras. Ciertas causas se diagnostican fcilmente. En algunos casos, se demora algn tiempo para comprender el diagnstico. Otras veces la causa no se conoce. El dolor abdominal puede ser un signo de que algo no anda bien en el [place], [place] puede ser debido a una causa totalmente diferente. Por este motivo, siempre comente a su mdico cuando sienta molestias abdominales.   CAUSAS   Las causas ms frecuentes y que no causan ningn dao son:    Constipacin.   Exceso de gases y meteorismo.   Dolor en el ligamento redondo. Este dolor se siente en los pliegues de la ingle.   La posicin en que se encuentra el beb o la placenta.   Las pataditas del beb.   Contracciones de Bra[place]. Estas son contracciones suaves que no producen dilatacin del cuello.  Otras causas graves de dolor abdominal son:    Vanetta Mulders ectpico Se produce cuando un vulo fertilizado se implanta fuera del tero.   Aborto espontneo.   Parto prematuro. El parto prematuro  comienza antes de la semana 37 de [place].   Desprendimiento de la placenta. Ocurre cuando la placenta se separa parcial o completamente del tero.   Preeclampsia Generalmente se asocia a hipertensin arterial y tambin se denomina "toxemia del embarazo".   Infecciones del tero o del lquido amnitico.   Las causas que no se relacionan con Firefighter son:    Infeccin del tracto urinario.   Clculos o inflamacin de la vescula.   Hepatitis u otras enfermedades del hgado.   Trastornos intestinales, virus en el estmago, intoxicacin alimentaria, lcera.   Apendicitis.   Clculos en el rin (renales).   Infeccin renal (pielonefritis).  INSTRUCCIONES PARA EL CUIDADO EN EL HOGAR   Si el dolor es leve:    No tenga relaciones sexuales y no coloque nada dentro de la vagina hasta que los sntomas hayan desaparecido completamente.   Descanse todo lo que pueda hasta que el dolor haya calmado. Si el dolor no mejora en una hora, comunquese con su mdico.   Si siente nuseas, beba lquidos claros. Evite los alimentos slidos hasta que no sienta Dentist en el estmago o desaparezcan las nuseas.   Tome slo la medicacin que le indic el profesional.   Concurra puntualmente a las citas con el mdico.  SOLICITE ATENCIN MDICA DE INMEDIATO SI:    Tiene un sangrado, prdida de lquidos o lo observa al limpiarse la vagina con tis.   El dolor o los clicos [place].   Tiene vmitos persistentes.   Comienza a Financial risk analyst al orinar u Centex Corporation.   Tiene fiebre.   Los movimientos del beb disminuyen.   Nota un debilitamiento extremo o se marea.   Tiene dificultad para respirar con o sin dolor abdominal.   Siente un dolor de Training and development officer  intenso junto al dolor abdominal.   Tiene secrecin vaginal con dolor abdominal.   Tiene diarrea persistente.   El dolor abdominal sigue an despus de Field seismologist.  ASEGRESE DE QUE:    Comprende estas instrucciones.   Controlar  su enfermedad.   Solicitar ayuda de inmediato si no mejora o si empeora.  Document Released: 12/08/2005 Document Revised: 11/27/2011 [facility] Patient Information 2012 [place], [place].Dolor de Merchandiser, retail   (Back Pain in Pregnancy)  El dolor de espalda es habitual durante el embarazo. Ocurre en aproximadamente la mitad de todos los Orlovista. Es importante para usted y su beb que permanezca activa durante el [place]. Si siente que Chief Technology Officer de espalda es lo que no le permite mantenerse activa o dormir bien, Scientist, clinical (histocompatibility and immunogenetics) a su mdico. La causa del dolor de espalda puede deberse a varios factores relacionados con los cambios durante el [place]. Afortunadamente, excepto que haya tenido problemas de espalda antes del [place], es probable que el dolor mejore despus del [place]. El dolor lumbar por lo general ocurre entre el quinto y sptimo mes del Psychiatrist. Sin embargo, puede ocurrir [employer] Locker primeros meses. Otros factores que aumentan el riesgo son:    Problemas previos en la espalda.   Lesiones en la espalda.   Tener gemelos o embarazos mltiples.   Tos persistente.   El estrs.   Movimientos repetitivos relacionados con Kathie Dike.   Enfermedad muscular o de la columna vertebral en la espalda.   Antecedentes familiares de problemas de espalda, rotura (hernia) de discos u osteoporosis.   Depresin, ansiedad y crisis de [place].  CAUSAS    En las embarazadas, el cuerpo produce una hormona llamada relaxina. Esta hormona hace que los ligamentos que conectan la zona lumbar y los huesos del pubis sean ms flexibles. Esta flexibilidad permite que el beb nazca con ms facilidad. Cuando los ligamentos estn relajados, los msculos tienen que trabajar ms para apoyar la espalda. El dolor en la espalda puede deberse al cansancio muscular. El dolor tambin puede tener su causa en la irritacin de los tejidos de a espalda que se irritan ya que estn recibiendo menos apoyo.   A  medida que el beb crece, ejerce presin [employer] Stationers nervios y los vasos sanguneos de la pelvis. Esto causa dolor de espalda.   A medida que el beb crece y [address] durante el [place], el tero presiona los msculos del estmago hacia adelante y [place] su centro de gravedad. Esto hace que los msculos de la espalda deban trabajar ms para mantener una buena [place].  SNTOMAS   Dolor lumbar durante el embarazo Generalmente se produce en la zona o por arriba de la cintura en el centro de la espalda. Puede haber dolor y entumecimiento que se irradia hacia la pierna o el pie. Es similar al dolor de espalda baja experimentada por las mujeres no embarazadas. Por lo general, aumenta al [employer] de pie o sentada por largos perodos de [place], o con levantamientos repetitivos Tambin puede haber sensibilidad en los msculos en la zona superior de la espalda .   Dolor plvico posterior Environmental consultant en la parte posterior de la pelvis es ms frecuente que el dolor lumbar en el embarazo. Se trata de un dolor profundo que se siente a un lado en la cintura, o a travs del cxis (sacro), o en ambos lugares. Puede sentir dolor en uno o ambos lados Este dolor tambin puede sentirse en las nalgas y el dorso  de los muslos Tambin puede haber dolor pbico y en la ingle. El dolor no se mejora rpidamente con el reposo, y [place] puede haber rigidez matutina. Muchas actividades pueden causarlo. Un buen estado fsico antes y [address] puede o no prevenir este problema. Las contracciones del parto suelen aparecer cada 1 a 2 minutos, tienen una duracin de aproximadamente 1 minuto, e implica una sensacin de empujar o presin en la pelvis. Sin embargo, si usted est a trmino con Firefighter, Chief Technology Officer constante en la zona lumbar puede indicar el comienzo de un parto prematuro, y usted debe ser consciente de ello.   DIAGNSTICO   No se deben tomar radiografas de la [employer] las primeras 12 a  14 semanas del embarazo y durante el resto del Psychiatrist, slo cuando sea absolutamente necesario. La resonancia magntica no emite radiacin y es un estudio seguro durante el Psychiatrist. Pero tambin se deben hacer solamente cuando sea absolutamente necesario.   INSTRUCCIONES PARA EL CUIDADO EN EL HOGAR    Realice actividad fsica segn las indicaciones del mdico. El ejercicio es la manera ms eficaz para prevenir o tratar Chief Technology Officer de espalda. Si tiene un problema en la espalda, es especialmente importante evitar los deportes que requieran de movimientos corporales rpidos. La natacin y las caminatas son las mejores [address]   No permanezca sentada o de pie en el mismo lugar durante largos perodos.   No use tacos altos.   Sintese en la silla con una buena postura. Use una almohada en su espalda baja si es necesario. Asegrese de que su cabeza descansa sobre sus hombros y no est colgando hacia delante.   Trate de dormir de lado, de preferencia el lado izquierdo, con una o [employer] piernas. Si est dolorida despus de una noche de descanso, la cama puede ser [employer]. Trate de colocar una tabla entre el colchn y el somier.   Prstele atencin a su cuerpo cuando se levante. Si siente dolor,pida ayuda o trate de doblar las rodillas ms para [employer] de las piernas en lugar de los msculos de la espalda. Pngase en cuclillas al levantar algo del suelo. No se doble.   Consuma una dieta saludable. Trate de aumentar de peso dentro de las recomendaciones de su mdico.   Utilice compresas de calor o fro de 3 a 4 veces al da durante 15 minutos para Primary school teacher.   Solo tome medicamentos que se pueden comprar sin receta o recetados para Chief Technology Officer, Dentist o fiebre, como le indica el mdico.  Dolor de espaldas repentino (agudo).  Haga reposo en cama slo en caso de los episodios ms extremos y agudos de Engineer, mining. El reposo prolongado en cama de ms de 48 horas  agravar su trastorno.   El hielo es muy efectivo en los problemas agudos.   Ponga el hielo en una bolsa plstica.   Colquese una toalla entre la piel y la bolsa de hielo.   Deje el hielo durante 10 a 20 minutos cada 2 horas o segn lo nesecite, mientras se encuentre despierta.   Las compresas de calor durante 30 minutos antes de las actividades tambin puede ayudar.  Dolor crnico en la espalda. Consulte a su mdico si el dolor es continuo. El mdico podr ayudarla o derivarla para que realice los ejercicios y trabajos de fortalecimiento apropiados. Con un buen entrenamiento fsico, podr evitar la mayor parte de los [place]. En algunos casos, la causa es un problema  ms grave. Debe ser controlada inmediatamente si aparecen nuevos problemas. El mdico tambin podr recomendar:    Una faja de maternidad.   Un arns elstico.   Un cors para la espalda.   Un masajista o acupuntura.  SOLICITE ATENCIN MDICA SI:    No puede Careers information officer de sus actividades diarias, an tomando los medicamentos para Psychologist, occupational.   Beverlee Nims ser derivada a un fisioterapeuta o quiroprxico.   Beverlee Nims intentar con acupuntura.  SOLICITE ATENCIN MDICA DE INMEDIATO SI:    Siente entumecimiento, hormigueo, debilidad o problemas con el uso de los brazos o las piernas.   Siente un dolor de espalda muy intenso que no se alivia con medicamentos.   Tiene modificaciones repentinas en el control de la vejiga o el intestino.   Aumenta el dolor en otras partes del cuerpo.   Siente que le falta el aire, se marea o sufre un [place].   Tiene nuseas, vmitos o sudoracin.   Siente un dolor en la espalda similar al del [place] de [place].   Cuando aparece [employer], rompe la bolsa de aguas o tiene un sangrado vaginal.   El dolor o el adormecimiento se extienden hacia la pierna.   El dolor aparece despus de una cada.   Siente dolor de un solo lado. Podra tener clculos  renales.   Observa sangre en la orina. Podra tener una infeccin en la vejiga o clculos renales.   Siente dolor y aparecen ronchas. Podra tener culebrilla.  El dolor de espalda es bastante frecuente durante el embarazo pero no debe aceptarse slo como parte del [place]. Siempre debe tratarse lo ms rpidamente posible. Har que su embarazo sea lo ms placentero posible.   Document Released: 08/20/2011 Document Revised: 11/27/2011 [facility] Patient Information 2012 [place], [place].Cmo crece un beb durante el embarazo (How a Baby Grows During Pregnancy) Un embarazo comienza cuando el espermatozoide del hombre ingresa al vulo de la [place]. Esto ocurre en la trompa de [place] y se denomina fertilizacin. El vulo fertilizado se llama embrin, hasta que llega a las nueve semanas a partir del momento de Tree surgeon. De las nueve semanas hasta el nacimiento se denomina "feto". El vulo fertilizado baja por la trompa [place] tero y se adhiere al recubrimiento interior del tero.   La mujer embarazada es responsable del crecimiento del embrin/feto al proveer alimento y oxgeno a travs de la corriente sangunea y la placenta hacia el feto en desarrollo. El tero se [place] y crece hacia fuera del abdomen ms y ms a medida que el feto se desarrolla y crece. Un embarazo normal dura [address] con un rango de [address], o 40 semanas. El [place] se divide en tres trimestres.  Primer trimestre - Semanas 0 a 13.   Segundo trimestre - Semana 14 a 27.   Tercer trimestre - Semana 28 a 40.  El da que se considera que va a nacer su beb se denomina fecha estimada de parto (FEP). CRECIMIENTO DEL BEB MES A MES 1. Primer mes: El vulo fertilizado se adhiere a Hydrographic surveyor interior del tero, y determinadas clulas formaran la placenta y Firefighter el feto. Comienzan a [employer], las piernas, el cerebro, la mdula espinal, los pulmones, y Insurance underwriter. Hacia el final del primer mes  el corazn comienza a Physicist, medical. El embrin pesa menos de 1 onza (28,35 gramos) y mide  pulgada (2,54 cm).  2. Gwynneth Aliment mes: Se pueden observar los  huesos, se forman el odo interno, los prpados, las manos y los pies y se Photographer. Hacia el fin de la [place], ya se estn desarrollando todos los rganos ms importantes. El feto ahora pesa menos de 1 onza [phone] gramos) y Anice Paganini pulgada (2,54 cm).  3. Tercer mes: Aparecen brotes de los dientes, todos los rganos internos se forman, los huesos y los msculos comienzan a Designer, industrial/product, la espina dorsal puede flexionarse y la piel es transparente. Comienzan a formarse las uas de las manos y los pies, las manos se desarrollan ms rpido [employer] 10 y los brazos son ms largos que las piernas en este punto. El feto ahora pesa un poco ms que 1 onza (.34kg) y mide 3 pulgadas (8,89cm).  4. Cuarto mes: La placenta se ha formado por completo. Se han formado los rganos sexuales externos, el cuello, el odo externo, las cejas, los prpados, y las uas. El feto ya puede or, tragar, flexionar sus brazos y sus piernas, y el rin comienza a producir orina. La piel tiene un recubrimiento blanco ceroso (vernix) y un bello muy fino (lanugo). El feto ahora pesa 5 onzas (0,14kg) y mide de 6 a 7 pulgadas (16,51 cm).  5. Quinto mes: El feto se mueve ms y puede sentirse por primera vez (se denomina avivamiento), duerme y se despierta en ocasiones, puede comenzar a succionarse el dedo y las uas crecen [employer] final del dedo. La vescula biliar ahora funciona y [place] a digerir los nutrientes, los ovarios se desarrollan en la mujer y los testculos comienzan a Publishing copy del abdomen hacia el escroto en el hombre. El feto ahora pesa  a 1 libra [phone]) y mide de 6 a 7 pulgadas (25,4cm).  6. Sexto mes: Se forman los pulmones pero el feto an no respira. Los ojos se abren, el cerebro se desarrolla ahora ms rpidamente, se pueden detectar las huellas dactilares de las  manos y los pies y crece bello ms grueso. El feto ahora pesa 1 a 1 libras [phone]) y mide 12 pulgadas (30,48 cm).  7. Sptimo mes: El feto puede oir y responder a los sonidos, patea y se estira y puede percibir cambios en la luz. El feto ahora pesa 2 a 2 libras (1,13kg) y mide 14 pulgadas (35,56 cm).  8. Octavo mes: Todos los rganos y sistemas corporales estn desarrollados por completo y en funcionamiento. Los huesos se vuelven ms duros, se desarrollan las papilas gustativas con las que puede percibir gustos dulces y [place], y el feto puede tener hipo. Las diferentes partes del cerebro se desarrollan y el crneo se mantiene blando para que crezca el cerebro. El feto ahora pesa 5 libras (2,27kg) y mide 74 pulgadas (45,75cm).  9. Noveno mes: El feto aumenta alrededor de media libra por semana, los pulmones estn completamente desarrollados, se desarrollan patrones de sueo y la cabeza se mueve hacia el fondo del tero, denominado vertex. Si en su lugar las nalgas se mueven hacia el fondo del tero, se denomina "presentacin de nalgas". El feto ahora pesa 6 a 9 libras (2,72kg a 30,08kg) y mide 20 pulgadas (50,8 cm).  Deber informarse lo ms posible acerca de su embarazo, de usted y de cmo se desarrolla el beb. Estar informada la ayuda a Freight forwarder experiencia. Tambin Radiographer, therapeutic la capacidad de sentir si algo no est bien y cundo Civil Service fast streamer. Consulte con el profesional que la asiste si tiene dudas acerca su beb o de su propio cuerpo. Document  Released: 05/26/2008 Document Revised: 11/27/2011 [facility] Patient Information 2012 [place], [place].

## 2012-09-16 ENCOUNTER — Other Ambulatory Visit: Payer: Self-pay

## 2012-09-16 DIAGNOSIS — Z331 Pregnant state, incidental: Secondary | ICD-10-CM

## 2012-09-16 LAB — GLUCOSE, CAPILLARY: Glucose-Capillary: 79 mg/dL (ref 70–99)

## 2012-09-16 NOTE — Progress Notes (Unsigned)
3 HR GTT DONE TODAY Samhitha Rosen 

## 2012-09-30 ENCOUNTER — Ambulatory Visit (INDEPENDENT_AMBULATORY_CARE_PROVIDER_SITE_OTHER): Payer: Self-pay | Admitting: Family Medicine

## 2012-09-30 DIAGNOSIS — Z3480 Encounter for supervision of other normal pregnancy, unspecified trimester: Secondary | ICD-10-CM

## 2012-09-30 DIAGNOSIS — N760 Acute vaginitis: Secondary | ICD-10-CM

## 2012-09-30 DIAGNOSIS — B373 Candidiasis of vulva and vagina: Secondary | ICD-10-CM

## 2012-09-30 LAB — POCT WET PREP (WET MOUNT)

## 2012-09-30 MED ORDER — MICONAZOLE NITRATE 2 % VA CREA: TOPICAL_CREAM | VAGINAL | Status: DC

## 2012-09-30 NOTE — Patient Instructions (Signed)
It was good to see you today! I am giving you a prescription for a yeast infection treatment.  You will be putting a cream in your vagina every evening for 7 days.  Fue bueno verte hoy! Te estoy dando una receta para un tratamiento de la infeccin por levaduras. Se le est poniendo una crema en la vagina cada noche durante 7 das.

## 2012-09-30 NOTE — Progress Notes (Signed)
1 week whitish discharge and itching.  No cramping or bleeding.  No fevers/chills.  Still with adequate fetal movement.  Exam: White curdy discharge present in vagina.  Cervix is slightly friable.  No bleeding.  Wet prep and cervical cultures obtained.  A/P: Yeast infection.  Treat with topicals for 7 days.  RTC if not better.  Will call pt if cervical cultures positive.

## 2012-10-05 ENCOUNTER — Other Ambulatory Visit: Payer: Self-pay | Admitting: Family Medicine

## 2012-10-05 DIAGNOSIS — O24419 Gestational diabetes mellitus in pregnancy, unspecified control: Secondary | ICD-10-CM

## 2012-10-05 DIAGNOSIS — O24919 Unspecified diabetes mellitus in pregnancy, unspecified trimester: Secondary | ICD-10-CM | POA: Insufficient documentation

## 2012-10-05 NOTE — Progress Notes (Signed)
Correction:  Pt's 3 hour: 67/180/156/118.

## 2012-10-05 NOTE — Progress Notes (Signed)
Note reivewed. Pregnancy issues identified: Failed 3 hour GTT 67/180/155/140.  Will need transfer to high risk. 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  Pt with h/o 4 term births, no preterm deliveries.  Will defer to [facility] service. Dating: Unclear if pt was on OCPs at conception.  If so, would use 9 week ultrasound for dating.  If sure LMP, regular menses and no hormonal contraception, would use LMP. Due for flu shot.

## 2012-10-06 ENCOUNTER — Encounter: Payer: Self-pay | Admitting: Family Medicine

## 2012-10-06 ENCOUNTER — Encounter: Payer: Self-pay | Admitting: Obstetrics & Gynecology

## 2012-10-07 ENCOUNTER — Encounter: Payer: Self-pay | Admitting: *Deleted

## 2012-10-14 ENCOUNTER — Ambulatory Visit (INDEPENDENT_AMBULATORY_CARE_PROVIDER_SITE_OTHER): Payer: Self-pay | Admitting: Family Medicine

## 2012-10-14 VITALS — BP 107/64 | Wt 154.0 lb

## 2012-10-14 DIAGNOSIS — O0990 Supervision of high risk pregnancy, unspecified, unspecified trimester: Secondary | ICD-10-CM

## 2012-10-14 DIAGNOSIS — Z3480 Encounter for supervision of other normal pregnancy, unspecified trimester: Secondary | ICD-10-CM

## 2012-10-14 NOTE — Progress Notes (Signed)
Pt presents today for FUOB.  No complaints.  Small amount of intermittent right sided abd discomfort.  Not feeling much fetal movement but no problems with bleeding, discharge, or contractions.  Appendix still present but no fevers, chills, anorexia, nausea/vomiting.  Exam: Per flow sheet.  Abd benign.  A/P: Pt has appt with high risk for gestational DM.  Advised patient of this appointment and answered all questions.  Advised of red flags for appendicitis, although think likelihood is very low.  No f/u with [place], keep appt with high risk.

## 2012-10-14 NOTE — Patient Instructions (Signed)
Embarazo - Segundo trimestre (Pregnancy - Second Trimester) El segundo trimestre del embarazo (del 3 al 6mes) es un perodo de evolucin rpida para usted y el beb. Hacia el final del sexto mes, el beb mide aproximadamente 23 cm y pesa 680 g. Comenzar a sentir los movimientos del beb entre las 18 y las 20 semanas de embarazo. Podr sentir las pataditas ("quickening en ingls"). Hay un rpido aumento de peso. Puede segregar un lquido claro (calostro) de las mamas. Quizs sienta pequeas contracciones en el vientre (tero) Esto se conoce como falso trabajo de parto o contracciones de Braxton-Hicks. Es como una prctica del trabajo de parto que se produce cuando el beb est listo para salir. Generalmente los problemas de vmitos matinales ya se han superado hacia el final del primer trimestre. Algunas mujeres desarrollan pequeas manchas oscuras (que se denominan cloasma, mscara del embarazo) en la cara que normalmente se van luego del nacimiento del beb. La exposicin al sol empeora las manchas. Puede desarrollarse acn en algunas mujeres embarazadas, y puede desaparecer en aquellas que ya tienen acn. EXAMENES PRENATALES  Durante los exmenes prenatales, deber seguir realizando pruebas de sangre, segn avance el embarazo. Estas pruebas se realizan para controlar su salud y la del beb. Tambin se realizan anlisis de sangre para conocer los niveles de hemoglobina. La anemia (bajo nivel de hemoglobina) es frecuente durante el embarazo. Para prevenirla, se administran hierro y vitaminas. Tambin se le realizarn exmenes para saber si tiene diabetes entre las 24 y las 28 semanas del embarazo. Podrn repetirle algunas de las pruebas que le hicieron previamente.  En cada visita le medirn el tamao del tero. Esto se realiza para asegurarse de que el beb est creciendo correctamente de acuerdo al estado del embarazo.  Tambin en cada visita prenatal controlarn su presin arterial. Esto se realiza  para asegurarse de que no tenga toxemia.  Se controlar su orina para asegurarse de que no tenga infecciones, diabetes o protena en la orina.  Se controlar su peso regularmente para asegurarse que el aumento ocurre al ritmo indicado. Esto se hace para asegurarse que usted y el beb tienen una evolucin normal.  En algunas ocasiones se realiza una prueba de ultrasonido para confirmar el correcto desarrollo y evolucin del beb. Esta prueba se realiza con ondas sonoras inofensivas para el beb, de modo que el profesional pueda calcular ms precisamente la fecha del parto. Algunas veces se realizan pruebas especializadas del lquido amnitico que rodea al beb. Esta prueba se denomina amniocentesis. El lquido amnitico se obtiene introduciendo una aguja en el vientre (abdomen). Se realiza para controlar los cromosomas en aquellos casos en los que existe alguna preocupacin acerca de algn problema gentico que pueda sufrir el beb. En ocasiones se lleva a cabo cerca del final del embarazo, si es necesario inducir al parto. En este caso se realiza para asegurarse que los pulmones del beb estn lo suficientemente maduros como para que pueda vivir fuera del tero. CAMBIOS QUE OCURREN EN EL SEGUNDO TRIMESTRE DEL EMBARAZO Su organismo atravesar numerosos cambios durante el embarazo. Estos pueden variar de una persona a otra. Converse con el profesional que la asiste acerca los cambios que usted note y que la preocupen.  Durante el segundo trimestre probablemente sienta un aumento del apetito. Es normal tener "antojos" de ciertas comidas. Esto vara de una persona a otra y de un embarazo a otro.  El abdomen inferior comenzar a abultarse.  Podr tener la necesidad de orinar con ms frecuencia debido a que   el tero y el beb presionan sobre la vejiga. Tambin es frecuente contraer ms infecciones urinarias durante el embarazo (dolor al orinar). Puede evitarlas bebiendo gran cantidad de lquidos y vaciando  la vejiga antes y despus de mantener relaciones sexuales.  Podrn aparecer las primeras estras en las caderas, abdomen y mamas. Estos son cambios normales del cuerpo durante el embarazo. No existen medicamentos ni ejercicios que puedan prevenir estos cambios.  Es posible que comience a desarrollar venas inflamadas y abultadas (varices) en las piernas. El uso de medias de descanso, elevar sus pies durante 15 minutos, 3 a 4 veces al da y limitar la sal en su dieta ayuda a aliviar el problema.  Podr sentir acidez gstrica a medida que el tero crece y presiona contra el estmago. Puede tomar anticidos, con la autorizacin de su mdico, para aliviar este problema. Tambin es til ingerir pequeas comidas 4 a 5 veces al da.  La constipacin puede tratarse con un laxante o agregando fibra a su dieta. Beber grandes cantidades de lquidos, comer vegetales, frutas y granos integrales es de gran ayuda.  Tambin es beneficioso practicar actividad fsica. Si ha sido una persona activa hasta el embarazo, podr continuar con la mayora de las actividades durante el mismo. Si ha sido menos activa, puede ser beneficioso que comience con un programa de ejercicios, como realizar caminatas.  Puede desarrollar hemorroides (vrices en el recto) hacia el final del segundo trimestre. Tomar baos de asiento tibios y utilizar cremas recomendadas por el profesional que lo asiste sern de ayuda para los problemas de hemorroides.  Tambin podr sentir dolor de espalda durante este momento de su embarazo. Evite levantar objetos pesados, utilice zapatos de taco bajo y mantenga una buena postura para ayudar a reducir los problemas de espalda.  Algunas mujeres embarazadas desarrollan hormigueo y adormecimiento de la mano y los dedos debido a la hinchazn y compresin de los ligamentos de la mueca (sndrome del tnel carpiano). Esto desaparece una vez que el beb nace.  Como sus pechos se agrandan, necesitar un sujetador  ms grande. Use un sostn de soporte, cmodo y de algodn. No utilice un sostn para amamantar hasta el ltimo mes de embarazo si va a amamantar al beb.  Podr observar una lnea oscura desde el ombligo hacia la zona pbica denominada linea nigra.  Podr observar que sus mejillas se ponen coloradas debido al aumento de flujo sanguneo en la cara.  Podr desarrollar "araitas" en la cara, cuello y pecho. Esto desaparece una vez que el beb nace. INSTRUCCIONES PARA EL CUIDADO DOMICILIARIO  Es extremadamente importante que evite el cigarrillo, hierbas medicinales, alcohol y las drogas no prescriptas durante el embarazo. Estas sustancias qumicas afectan la formacin y el desarrollo del beb. Evite estas sustancias durante todo el embarazo para asegurar el nacimiento de un beb sano.  La mayor parte de los cuidados que se aconsejan son los mismos que los indicados para el primer trimestre del embarazo. Cumpla con las citas tal como se le indic. Siga las instrucciones del profesional que lo asiste con respecto al uso de los medicamentos, el ejercicio y la dieta.  Durante el embarazo debe obtener nutrientes para usted y para su beb. Consuma alimentos balanceados a intervalos regulares. Elija alimentos como carne, pescado, leche y otros productos lcteos descremados, vegetales, frutas, panes integrales y cereales. El profesional le informar cul es el aumento de peso ideal.  Las relaciones sexuales fsicas pueden continuarse hasta cerca del fin del embarazo si no existen otros problemas. Estos   problemas pueden ser la prdida temprana (prematura) de lquido amnitico de las membranas, sangrado vaginal, dolor abdominal u otros problemas mdicos o del embarazo.  Realice actividad fsica todos los das, si no tiene restricciones. Consulte con el profesional que la asiste si no sabe con certeza si determinados ejercicios son seguros. El mayor aumento de peso tiene lugar durante los ltimos 2 trimestres del  embarazo. El ejercicio la ayudar a:  Controlar su peso.  Ponerla en forma para el parto.  Ayudarla a perder peso luego de haber dado a luz.  Use un buen sostn o como los que se usan para hacer deportes para aliviar la sensibilidad de las mamas. Tambin puede serle til si lo usa mientras duerme. Si pierde calostro, podr utilizar apsitos en el sostn.  No utilice la baera con agua caliente, baos turcos y saunas durante el embarazo.  Utilice el cinturn de seguridad sin excepcin cuando conduzca. Este la proteger a usted y al beb en caso de accidente.  Evite comer carne cruda, queso crudo, y el contacto con los utensilios y desperdicios de los gatos. Estos elementos contienen grmenes que pueden causar defectos de nacimiento en el beb.  El segundo trimestre es un buen momento para visitar a su dentista y evaluar su salud dental si an no lo ha hecho. Es importante mantener los dientes limpios. Utilice un cepillo de dientes blando. Cepllese ms suavemente durante el embarazo.  Es ms fcil perder algo de orina durante el embarazo. Apretar y fortalecer los msculos de la pelvis la ayudar con este problema. Practique detener la miccin cuando est en el bao. Estos son los mismos msculos que necesita fortalecer. Son tambin los mismos msculos que utiliza cuando trata de evitar los gases. Puede practicar apretando estos msculos 10 veces, y repetir esto 3 veces por da aproximadamente. Una vez que conozca qu msculos debe apretar, no realice estos ejercicios durante la miccin. Puede favorecerle una infeccin si la orina vuelve hacia atrs.  Pida ayuda si tiene necesidades econmicas, de asesoramiento o nutricionales durante el embarazo. El profesional podr ayudarla con respecto a estas necesidades, o derivarla a otros especialistas.  La piel puede ponerse grasa. Si esto sucede, lvese la cara con un jabn suave, utilice un humectante no graso y maquillaje con base de aceite o  crema. CONSUMO DE MEDICAMENTOS Y DROGAS DURANTE EL EMBARAZO  Contine tomando las vitaminas apropiadas para esta etapa tal como se le indic. Las vitaminas deben contener un miligramo de cido flico y deben suplementarse con hierro. Guarde todas las vitaminas fuera del alcance de los nios. La ingestin de slo un par de vitaminas o tabletas que contengan hierro puede ocasionar la muerte en un beb o en un nio pequeo.  Evite el uso de medicamentos, inclusive los de venta libre y hierbas que no hayan sido prescriptos o indicados por el profesional que la asiste. Algunos medicamentos pueden causar problemas fsicos al beb. Utilice los medicamentos de venta libre o de prescripcin para el dolor, el malestar o la fiebre, segn se lo indique el profesional que lo asiste. No utilice aspirina.  El consumo de alcohol est relacionado con ciertos defectos de nacimiento. Esto incluye el sndrome de alcoholismo fetal. Debe evitar el consumo de alcohol en cualquiera de sus formas. El cigarrillo causa nacimientos prematuros y bebs de bajo peso. El uso de drogas recreativas est absolutamente prohibido. Son muy nocivas para el beb. Un beb que nace de una madre adicta, ser adicto al nacer. Ese beb tendr los mismos   sntomas de abstinencia que un adulto.  Infrmele al profesional si consume alguna droga.  No consuma drogas ilegales. Pueden causarle mucho dao al beb. SOLICITE ATENCIN MDICA SI: Tiene preguntas o preocupaciones durante su embarazo. Es mejor que llame para consultar las dudas que esperar hasta su prxima visita prenatal. De esta forma se sentir ms tranquila.  SOLICITE ATENCIN MDICA DE INMEDIATO SI:  La temperatura oral se eleva sin motivo por encima de 102 F (38.9 C) o segn le indique el profesional que lo asiste.  Tiene una prdida de lquido por la vagina (canal de parto). Si sospecha una ruptura de las membranas, tmese la temperatura y llame al profesional para informarlo sobre  esto.  Observa unas pequeas manchas, una hemorragia vaginal o elimina cogulos. Notifique al profesional acerca de la cantidad y de cuntos apsitos est utilizando. Unas pequeas manchas de sangre son algo comn durante el embarazo, especialmente despus de mantener relaciones sexuales.  Presenta un olor desagradable en la secrecin vaginal y observa un cambio en el color, de transparente a blanco.  Contina con las nuseas y no obtiene alivio de los remedios indicados. Vomita sangre o algo similar a la borra del caf.  [place] o sube ms de 900 g. en una semana, o segn lo indicado por el profesional que la asiste.  Observa que se le hinchan el rostro, las manos, los pies o las piernas.  Ha estado expuesta a la rubola y no ha sufrido la enfermedad.  Ha estado expuesta a la quinta enfermedad o a la varicela.  Presenta dolor abdominal. Las molestias en el ligamento redondo son una causa no cancerosa (benigna) frecuente de dolor abdominal durante el embarazo. El profesional que la asiste deber evaluarla.  Presenta dolor de cabeza intenso que no se alivia.  Presenta fiebre, diarrea, dolor al orinar o le falta la respiracin.  Presenta dificultad para ver, visin borrosa, o visin doble.  Sufre una cada, un accidente de trnsito o cualquier tipo de trauma.  Vive en un hogar en el que existe violencia fsica o mental. Document Released: 09/17/2005 Document Revised: 03/01/2012 [employer] Patient Information 2013 [employer].  

## 2012-10-18 ENCOUNTER — Encounter: Payer: Self-pay | Admitting: Obstetrics & Gynecology

## 2012-10-18 ENCOUNTER — Encounter: Payer: Self-pay | Attending: Obstetrics & Gynecology | Admitting: Dietician

## 2012-10-18 ENCOUNTER — Ambulatory Visit (INDEPENDENT_AMBULATORY_CARE_PROVIDER_SITE_OTHER): Payer: Self-pay | Admitting: Obstetrics & Gynecology

## 2012-10-18 VITALS — BP 100/64 | Temp 96.7°F | Wt 152.0 lb

## 2012-10-18 DIAGNOSIS — Z713 Dietary counseling and surveillance: Secondary | ICD-10-CM | POA: Insufficient documentation

## 2012-10-18 DIAGNOSIS — O24419 Gestational diabetes mellitus in pregnancy, unspecified control: Secondary | ICD-10-CM

## 2012-10-18 DIAGNOSIS — O0990 Supervision of high risk pregnancy, unspecified, unspecified trimester: Secondary | ICD-10-CM

## 2012-10-18 DIAGNOSIS — O99810 Abnormal glucose complicating pregnancy: Secondary | ICD-10-CM

## 2012-10-18 LAB — POCT URINALYSIS DIP (DEVICE)
Protein, ur: NEGATIVE mg/dL
Urobilinogen, UA: 0.2 mg/dL (ref 0.0–1.0)

## 2012-10-18 NOTE — Progress Notes (Signed)
Pulse 80.  C/o epigastric pain last night and some this morning. No c/o pressure.

## 2012-10-18 NOTE — Progress Notes (Signed)
U/S scheduled 10/20/12 at 330pm.

## 2012-10-18 NOTE — Progress Notes (Signed)
Diabetes Education:  Completed meter instructions with the assistance of the Spanish interpreter Raynelle Fanning.  Review of testing the fasting and 2 hr post meal glucose levels.  Provided a True Trach meter kit Lot: [id number] Exp: 2014/10/09 and 1 box of strips [id number] Exp: 2014/11/20 and 1 box of lancets Lot:[id number]  On return demonstration her fasting glucose was at 92 mg/dl.  Instructed to bring meter and glucose log to all clinic appointments.  Maggie Krist Rosenboom, RN, CDE

## 2012-10-18 NOTE — Progress Notes (Signed)
Referred by Dr. Louanne Belton for abnormal 3 hr GTT, gestational DM.  67, 180, 156, 118  Subjective:    Kimberly Avila is a [id number] [redacted]w[redacted]d being seen today for her first obstetrical visit.  Her obstetrical history is significant for gestational diabetes diet controlled first pregnancy. Patient does intend to breast feed. Pregnancy history fully reviewed. Referred due to Class A/B diabetes22 Patient reports no complaints.  Filed Vitals:   10/18/12 0823  BP: 100/64  Temp: 96.7 F (35.9 C)  Weight: 152 lb (68.947 kg)    HISTORY: OB History    Grav Para Term Preterm Abortions TAB SAB Ect Mult Living   5 4 4       4      # Outc Date GA Lbr Len/2nd Wgt Sex Del Anes PTL Lv   1 TRM 3/03 [redacted]w[redacted]d  6lb6oz(2.892kg) F SVD   Yes   2 TRM 10/05 [redacted]w[redacted]d  7lb(3.175kg) M SVD   Yes   3 TRM 12/08 [redacted]w[redacted]d  8lb4oz(3.742kg) M SVD   Yes   4 TRM 11/10 [redacted]w[redacted]d  9lb(4.082kg) M SVD   Yes   5 GRA              Past Medical History  Diagnosis Date  . No pertinent past medical history    Past Surgical History  Procedure Date  . No past surgeries    No family history on file.   Exam    Uterus:   22 week  Pelvic Exam:    Perineum: deferred   Vulva: deferred   Vagina:  deferred   pH:    Cervix: not checked   Adnexa: not evaluated   Bony Pelvis: not checked  System: Breast:  examined at [facility]   Skin: normal coloration and turgor, no rashes    Neurologic: oriented, normal, normal mood   Extremities: normal strength, tone, and muscle mass   HEENT    Mouth/Teeth mucous membranes moist, pharynx normal without lesions and dental hygiene good   Neck supple   Cardiovascular:    Respiratory:  appears well, vitals normal, no respiratory distress, acyanotic, normal RR   Abdomen: soft, non-tender; bowel sounds normal; no masses,  no organomegaly   Urinary:       Assessment:    Pregnancy: [id number] Patient Active Problem List  Diagnosis  . DYSLIPIDEMIA  . HEMORRHOIDS  . SLEEP DISORDER  . HYPERGLYCEMIA  .  Pelvic pain complicating pregnancy  . Anal fissure  . Gestational diabetes  . Pregnancy, supervision for, high-risk        Plan:     Initial labs drawn.At [facility] Prenatal vitamins. Problem list reviewed and updated. Genetic Screening discussed Quad Screen: too late, needs [place].  Ultrasound discussed; fetal survey: requested.  Follow up in 2 weeks. 50% of 30 min visit spent on counseling and coordination of care.  Diabetes management, testing, diet   [name] 10/18/2012

## 2012-10-18 NOTE — Patient Instructions (Addendum)
Diabetes mellitus gestacional (Gestational Diabetes Mellitus) La diabetes mellitus gestacional se produce slo durante el embarazo. Aparece cuando el organismo no puede controlar adecuadamente la glucosa (azcar) que aumenta en la sangre despus de comer. Durante el embarazo, se produce una resistencia a la insulina (sensibilidad reducida a la insulina) debido a la liberacin de hormonas por parte de la placenta. Generalmente, el pncreas de una mujer embarazada produce la cantidad suficiente de insulina para vencer esa resistencia. Sin embargo, en la diabetes gestacional, hay insulina pero no cumple su funcin adecuadamente. Si la resistencia es lo suficientemente grave como para que el pncreas no produzca la cantidad de insulina suficiente, la glucosa extra se acumula en la sangre.  QUINES TIENEN RIESGO DE DESARROLLAR DIABETES GESTACIONAL?  Las mujeres con historia de diabetes en la familia.  Las mujeres de ms de 25 aos.  Las que presentan sobrepeso.  Las mujeres que pertenecen a ciertos grupos tnicos (latinas, afroamericanas, norteamericanas nativas, asiticas y las originarias de las islas del Pacfico. QUE PUEDE OCURRIRLE AL BEB? Si el nivel de glucosa en sangre de la madre es demasiado elevado mientras este embarazada, el nivel extra de azcar pasar por el cordn umbilical hacia el beb. Algunos de los problemas del beb pueden ser:  Beb demasiado grande: si el nio recibe demasiada azcar, puede aumentar mucho de peso. Esto puede hacer que sea demasiado grande para nacer por parto normal (vaginal) por lo que ser necesario realizar una cesrea.  Bajo nivel de glucosa (hipoglucemia): el beb produce insulina extra en respuesta a la excesiva cantidad de azcar que obtiene de la madre. Cuando el beb nace y ya no necesita insulina extra, su nivel de azcar en sangre puede disminuir.  Ictericia (coloracin amarillenta de la piel y los ojos): esto es bastante frecuente en los bebs. La  causa es la acumulacin de una sustancia qumica denominada bilirrubina. No siempre es un trastorno grave, pero se observa con frecuencia en los bebs cuyas madres sufren diabetes gestacional. RIESGOS PARA LA MADRE Las mujeres que han sufrido diabetes gestacional pueden tener ms riesgos para algunos problemas como:  Preeclampsia o toxemia, incluyendo problemas con hipertensin arterial. La presin arterial y los niveles de protenas en la orina deben controlarse con frecuencia.  Infecciones  Parto por cesrea.  Aparicin de diabetes tipo 2 en una etapa posterior de la vida. Alrededor del 30% al 50% sufrir diabetes posteriormente, especialmente las que son obesas. DIAGNSTICO Las hormonas que causan resistencia a la insulina tienen su mayor nivel alrededor de las 24 a 28 semanas del embarazo. Si se experimentan sntomas, stos son similares a los sntomas que normalmente aparecen durante el embarazo.  La diabetes mellitus gestacional generalmente se diagnostica por medio de un mtodo en dos partes: 1. Despus de la 24 a 28 semanas de embarazo, la mujer debe beber una solucin que contiene glucosa y realizar un anlisis de sangre. Si el nivel de glucosa es elevado, la realizarn un segundo anlisis. 2. La prueba oral de tolerancia a la glucosa, que dura aproximadamente tres horas. Despus de realizar ayuno durante la noche, se controla nivel de glucosa en sangre. La mujer bebe una solucin que contiene glucosa y le realizan anlisis de glucosa en sangre cada hora. Si la mujer tiene factores de riesgos para la diabetes mellitus gestacional, el mdico podr indicar el anlisis antes de las 24 semanas de embarazo. TRATAMIENTO El tratamiento est dirigido a mantener la glucosa en sangre de la madre en un nivel normal y puede incluir:  La   planificacin de los alimentos.  Recibir insulina u otro medicamento para controlar el nivel de glucosa en sangre.  La prctica de ejercicios.  Llevar un  registro diario de los alimentos que consume.  Control y registro de los niveles de glucosa en sangre.  Control de los niveles de cetona en la orina, aunque esto ya no se considera necesario en la mayora de los embarazos. INSTRUCCIONES PARA EL CUIDADO DOMICILIARIO Mientras est embarazada:  Siga los consejos de su mdico relacionados con los controles prenatales, la planificacin de la comida, la actividad fsica, los medicamentos, vitaminas, los anlisis de sangre y otras pruebas y las actividades fsicas.  Lleve un registro de las comidas, las pruebas de glucosa en sangre y la cantidad de insulina que recibe (si corresponde). Muestre todo al profesional en cada consulta mdica prenatal.  Si sufre diabetes mellitus gestacional, podr tener problemas de hipoglucemia (nivel bajo de glucosa en sangre). Podr sospechar este problema si se siente repentinamente mareada, tiene temblores y/o se siente dbil. Si cree que esto le est ocurriendo, y tiene un medidor de glucosa, mida su nivel de glucosa en sangre. Siga los consejos de su mdico sobre el modo y el momento de tratar su nivel de glucosa en sangre. Generalmente se sigue la regla 15:15 Consuma 15 g de hidratos de carbono, espere 15 minutos y vuelva controlar el nivel de glucosa en sangre.. Ejemplos de 15 g de hidratos de carbono son:  1 taza de leche descremada.   taza de jugo.  3-4 tabletas de glucosa.  5-6 caramelos duros.  1 caja pequea de pasas de uva.   taza de gaseosa comn.  Mantenga una buena higiene para evitar infecciones.  No fume. SOLICITE ATENCIN MDICA SI:  Observa prdida vaginal con o sin picazn.  Se siente ms dbil o cansada que lo habitual.  Transpira mucho.  Tiene un aumento de peso repentino, 2,5 kg o ms en una semana.  Pierde peso, 1.5 kg o ms en una semana.  Su nivel de glucosa en sangre es elevado, necesita instrucciones. SOLICITE ATENCIN MDICA DE INMEDIATO SI:  Sufre una cefalea  intensa.  Se marea o pierde el conocimiento  Presenta nuseas o vmitos.  Se siente desorientada confundida.  Sufre convulsiones.  Tiene problemas de visin.  Siente dolor en el estmago.  Presenta una hemorragia vaginal abundante.  Tiene contracciones uterinas.  Tiene una prdida importante de lquido por la vagina DESPUS QUE NACE EL BEB:  Concurra a todos los controles de seguimiento y realice los anlisis de sangre segn las indicaciones de su mdico.  Mantenga un estilo de vida saludable para evitar la diabetes en el futuro. Aqu se incluye:  Siga el plan de alimentacin saludable.  Controle su peso.  Practique actividad fsica y descanse lo necesario.  No fume.  Amamante a su beb mientras pueda. Esto disminuir la probabilidad de que usted y su beb sufran diabetes posteriormente. Para ms informacin acerca de la diabetes, visite la pgina web de la American Diabetes Association: www.americandiabetesassociation.org. Para ms informacin acerca de la diabetes gestacional cite la pgina web del American Congress of Obstetricians and Gynecologists en: www.acog.org. Document Released: 09/17/2005 Document Revised: 03/01/2012 [employer] Patient Information 2013 [employer].  

## 2012-10-18 NOTE — Progress Notes (Signed)
Nutrition Note: 1st visit consult Pt seen for GDM diet education. Pt's total wt gain is 0# but pt reports losing wt in the beginning & then gained it back. Pt reports eating 3 meals & 2 snacks/ d and drinks milk & water. Pt reports no N&V. Pt is taking PNV. Pt given verbal & written GDM education. Disc wt gain goals of 15-25# total or 0.6#/wk. Encouraged BF. Disc calorie dense snacks for wt gain. Pt agrees to follow GDM diet including 3 meals & 3 snacks with proper CHO/ protein combination.  Pt does not receive WIC but plans to apply. Pt does plan to BF. F/u 2-4 weeks Blondell Reveal, MS, RD, LDN

## 2012-10-20 ENCOUNTER — Other Ambulatory Visit: Payer: Self-pay | Admitting: Obstetrics & Gynecology

## 2012-10-20 ENCOUNTER — Ambulatory Visit (HOSPITAL_COMMUNITY)
Admission: RE | Admit: 2012-10-20 | Discharge: 2012-10-20 | Disposition: A | Discharge status: Home / Self Care | Payer: Self-pay | Source: Ambulatory Visit | Attending: Obstetrics & Gynecology | Admitting: Obstetrics & Gynecology

## 2012-10-20 DIAGNOSIS — O99810 Abnormal glucose complicating pregnancy: Secondary | ICD-10-CM | POA: Insufficient documentation

## 2012-10-20 DIAGNOSIS — O0990 Supervision of high risk pregnancy, unspecified, unspecified trimester: Secondary | ICD-10-CM

## 2012-10-20 DIAGNOSIS — Z1389 Encounter for screening for other disorder: Secondary | ICD-10-CM | POA: Insufficient documentation

## 2012-10-20 DIAGNOSIS — O358XX0 Maternal care for other (suspected) fetal abnormality and damage, not applicable or unspecified: Secondary | ICD-10-CM | POA: Insufficient documentation

## 2012-10-20 DIAGNOSIS — O24419 Gestational diabetes mellitus in pregnancy, unspecified control: Secondary | ICD-10-CM

## 2012-10-20 DIAGNOSIS — Z363 Encounter for antenatal screening for malformations: Secondary | ICD-10-CM | POA: Insufficient documentation

## 2012-10-23 ENCOUNTER — Inpatient Hospital Stay (HOSPITAL_COMMUNITY)
Admission: AD | Admit: 2012-10-23 | Discharge: 2012-10-24 | Disposition: A | Discharge status: Home / Self Care | Payer: Self-pay | Source: Ambulatory Visit | Attending: Family Medicine | Admitting: Family Medicine

## 2012-10-23 ENCOUNTER — Encounter (HOSPITAL_COMMUNITY): Payer: Self-pay | Admitting: *Deleted

## 2012-10-23 DIAGNOSIS — T391X5A Adverse effect of 4-Aminophenol derivatives, initial encounter: Secondary | ICD-10-CM | POA: Insufficient documentation

## 2012-10-23 DIAGNOSIS — L299 Pruritus, unspecified: Secondary | ICD-10-CM | POA: Insufficient documentation

## 2012-10-23 DIAGNOSIS — O99891 Other specified diseases and conditions complicating pregnancy: Secondary | ICD-10-CM | POA: Insufficient documentation

## 2012-10-23 DIAGNOSIS — R109 Unspecified abdominal pain: Secondary | ICD-10-CM | POA: Insufficient documentation

## 2012-10-23 MED ORDER — DIPHENHYDRAMINE HCL 50 MG/ML IJ SOLN
25.0000 mg | Freq: Once | INTRAMUSCULAR | Status: AC
  Administered 2012-10-23: 50 mg via INTRAVENOUS

## 2012-10-23 MED ORDER — SODIUM CHLORIDE 0.9 % IV SOLN
INTRAVENOUS | Status: DC
  Administered 2012-10-23: via INTRAVENOUS

## 2012-10-23 NOTE — MAU Note (Signed)
Took Tylenol 500mg  about an hour and half ago. About later started itching from feet up and stomach got really hard. Rash all over

## 2012-10-24 DIAGNOSIS — O9989 Other specified diseases and conditions complicating pregnancy, childbirth and the puerperium: Secondary | ICD-10-CM

## 2012-10-24 DIAGNOSIS — R109 Unspecified abdominal pain: Secondary | ICD-10-CM

## 2012-10-24 DIAGNOSIS — O99891 Other specified diseases and conditions complicating pregnancy: Secondary | ICD-10-CM

## 2012-10-24 DIAGNOSIS — L299 Pruritus, unspecified: Secondary | ICD-10-CM

## 2012-10-24 MED ORDER — DIPHENHYDRAMINE HCL 50 MG/ML IJ SOLN
INTRAMUSCULAR | Status: AC
  Filled 2012-10-24: qty 1

## 2012-10-24 NOTE — MAU Note (Signed)
Patient states that her itching ha decreased, still having abdominal pain explained that we are giving her fluid and hope that it stops her pain.

## 2012-10-24 NOTE — MAU Note (Signed)
IV discontinued catheter tip intact, pressure dressing applied, discharge instructions were reviewed on PTL, instructed patient to not take Tylenol again, these instructions were reviewed with Spanish interpreter Alex, patient was discharge home with her husband driving her.

## 2012-10-24 NOTE — MAU Note (Signed)
Patient states itching is better however still having lower abdominal pain, notified W. Jerolyn Center CNM.

## 2012-11-01 ENCOUNTER — Ambulatory Visit (INDEPENDENT_AMBULATORY_CARE_PROVIDER_SITE_OTHER): Payer: Self-pay | Admitting: Obstetrics & Gynecology

## 2012-11-01 ENCOUNTER — Encounter: Payer: Self-pay | Attending: Obstetrics & Gynecology | Admitting: Dietician

## 2012-11-01 VITALS — BP 99/66 | Temp 96.6°F | Wt 152.1 lb

## 2012-11-01 DIAGNOSIS — O99810 Abnormal glucose complicating pregnancy: Secondary | ICD-10-CM | POA: Insufficient documentation

## 2012-11-01 DIAGNOSIS — O24419 Gestational diabetes mellitus in pregnancy, unspecified control: Secondary | ICD-10-CM

## 2012-11-01 DIAGNOSIS — Z713 Dietary counseling and surveillance: Secondary | ICD-10-CM | POA: Insufficient documentation

## 2012-11-01 LAB — POCT URINALYSIS DIP (DEVICE)
Glucose, UA: NEGATIVE mg/dL
Hgb urine dipstick: NEGATIVE
Nitrite: NEGATIVE
Protein, ur: NEGATIVE mg/dL
Urobilinogen, UA: 2 mg/dL — ABNORMAL HIGH (ref 0.0–1.0)

## 2012-11-01 NOTE — Patient Instructions (Signed)
Diabetes mellitus gestacional (Gestational Diabetes Mellitus) La diabetes mellitus gestacional se produce slo durante el embarazo. Aparece cuando el organismo no puede controlar adecuadamente la glucosa (azcar) que aumenta en la sangre despus de comer. Durante el embarazo, se produce una resistencia a la insulina (sensibilidad reducida a la insulina) debido a la liberacin de hormonas por parte de la placenta. Generalmente, el pncreas de una mujer embarazada produce la cantidad suficiente de insulina para vencer esa resistencia. Sin embargo, en la diabetes gestacional, hay insulina pero no cumple su funcin adecuadamente. Si la resistencia es lo suficientemente grave como para que el pncreas no produzca la cantidad de insulina suficiente, la glucosa extra se acumula en la sangre.  QUINES TIENEN RIESGO DE DESARROLLAR DIABETES GESTACIONAL?  Las mujeres con historia de diabetes en la familia.  Las mujeres de ms de 25 aos.  Las que presentan sobrepeso.  Las mujeres que pertenecen a ciertos grupos tnicos (latinas, afroamericanas, norteamericanas nativas, asiticas y las originarias de las islas del Pac[employer]. QUE PUEDE OCURRIRLE AL BEB? Si el nivel de glucosa en sangre de la madre es demasiado elevado mientras este embarazada, el nivel extra de azcar pasar por el cordn umbilical hacia el beb. Algunos de los problemas del beb pueden ser:  Beb demasiado grande: si el nio recibe demasiada azcar, puede aumentar mucho de peso. Esto puede hacer que sea demasiado grande para nacer por parto normal (vaginal) por lo que ser necesario realizar una cesrea.  Bajo nivel de glucosa (hipoglucemia): el beb produce insulina extra en respuesta a la excesiva cantidad de azcar que obtiene de la madre. Cuando el beb nace y ya no necesita insulina extra, su nivel de azcar en sangre puede disminuir.  Ictericia (coloracin amarillenta de la piel y los ojos): esto es bastante frecuente en los bebs. La  causa es la acumulacin de una sustancia qumica denominada bilirrubina. No siempre es un trastorno grave, pero se observa con frecuencia en los bebs cuyas madres sufren diabetes gestacional. RIESGOS PARA LA MADRE Las mujeres que han sufrido diabetes gestacional pueden tener ms riesgos para algunos problemas como:  Preeclampsia o toxemia, incluyendo problemas con hipertensin arterial. La presin arterial y los niveles de protenas en la orina deben controlarse con frecuencia.  Infecciones  Parto por cesrea.  Aparicin de diabetes tipo 2 en una etapa posterior de la vida. Alrededor del 30% al 50% sufrir diabetes posteriormente, especialmente las que son obesas. DIAGNSTICO Las hormonas que causan resistencia a la insulina tienen su mayor nivel alrededor de las 24 a 28 semanas del embarazo. Si se experimentan sntomas, stos son similares a los sntomas que normalmente aparecen durante el embarazo.  La diabetes mellitus gestacional generalmente se diagnostica por medio de un mtodo en dos partes: 1. Despus de la 24 a 28 semanas de embarazo, la mujer debe beber una solucin que contiene glucosa y realizar un anlisis de sangre. Si el nivel de glucosa es elevado, la realizarn un segundo anlisis. 2. La prueba oral de tolerancia a la glucosa, que dura aproximadamente tres horas. Despus de realizar ayuno durante la noche, se controla nivel de glucosa en sangre. La mujer bebe una solucin que contiene glucosa y le realizan anlisis de glucosa en sangre cada hora. Si la mujer tiene factores de riesgos para la diabetes mellitus gestacional, el mdico podr indicar el anlisis antes de las 24 semanas de embarazo. TRATAMIENTO El tratamiento est dirigido a mantener la glucosa en sangre de la madre en un nivel normal y puede incluir:  La   planificacin de los alimentos.  Recibir insulina u otro medicamento para controlar el nivel de glucosa en sangre.  La prctica de ejercicios.  Llevar un  registro diario de los alimentos que consume.  Control y registro de los niveles de glucosa en sangre.  Control de los niveles de cetona en la orina, aunque esto ya no se considera necesario en la mayora de los embarazos. INSTRUCCIONES PARA EL CUIDADO DOMICILIARIO Mientras est embarazada:  Siga los consejos de su mdico relacionados con los controles prenatales, la planificacin de la comida, la actividad fsica, los medicamentos, vitaminas, los anlisis de sangre y otras pruebas y las actividades fsicas.  Lleve un registro de las comidas, las pruebas de glucosa en sangre y la cantidad de insulina que recibe (si corresponde). Muestre todo al profesional en cada consulta mdica prenatal.  Si sufre diabetes mellitus gestacional, podr tener problemas de hipoglucemia (nivel bajo de glucosa en sangre). Podr sospechar este problema si se siente repentinamente mareada, tiene temblores y/o se siente dbil. Si cree que esto le est ocurriendo, y tiene un medidor de glucosa, mida su nivel de glucosa en sangre. Siga los consejos de su mdico sobre el modo y el momento de tratar su nivel de glucosa en sangre. Generalmente se sigue la regla 15:15 Consuma 15 g de hidratos de carbono, espere 15 minutos y vuelva controlar el nivel de glucosa en sangre.. Ejemplos de 15 g de hidratos de carbono son:  1 taza de leche descremada.   taza de jugo.  3-4 tabletas de glucosa.  5-6 caramelos duros.  1 caja pequea de pasas de uva.   taza de gaseosa comn.  Mantenga una buena higiene para evitar infecciones.  No fume. SOLICITE ATENCIN MDICA SI:  Observa prdida vaginal con o sin picazn.  Se siente ms dbil o cansada que lo habitual.  Transpira mucho.  Tiene un aumento de peso repentino, 2,5 kg o ms en una semana.  Pierde peso, 1.5 kg o ms en una semana.  Su nivel de glucosa en sangre es elevado, necesita instrucciones. SOLICITE ATENCIN MDICA DE INMEDIATO SI:  Sufre una cefalea  intensa.  Se marea o pierde el conocimiento  Presenta nuseas o vmitos.  Se siente desorientada confundida.  Sufre convulsiones.  Tiene problemas de visin.  Siente dolor en el estmago.  Presenta una hemorragia vaginal abundante.  Tiene contracciones uterinas.  Tiene una prdida importante de lquido por la vagina DESPUS QUE NACE EL BEB:  Concurra a todos los controles de seguimiento y realice los anlisis de sangre segn las indicaciones de su mdico.  Mantenga un estilo de vida saludable para evitar la diabetes en el futuro. Aqu se incluye:  Siga el plan de alimentacin saludable.  Controle su peso.  Practique actividad fsica y descanse lo necesario.  No fume.  Amamante a su beb mientras pueda. Esto disminuir la probabilidad de que usted y su beb sufran diabetes posteriormente. Para ms informacin acerca de la diabetes, visite la pgina web de la American Diabetes Association: www.americandiabetesassociation.org. Para ms informacin acerca de la diabetes gestacional cite la pgina web del American Congress of Obstetricians and Gynecologists en: www.acog.org. Document Released: 09/17/2005 Document Revised: 03/01/2012 Exit[employer] Patient Information 2013 [employer].  

## 2012-11-01 NOTE — Progress Notes (Signed)
Diabetes Education:  Seen today for F/Y regarding testing patterns.  Monitoring only 2 times per day.  Review of the testing schedule with the assistance of Trinna Post the [place] interpreter.  The random pre-meal test that we did when reviewing the testing procedure, was 91 mg before lunch.  Ask that she monitor the fasting and 2 hour post-meal blood glucose levels, record and bring her log book to each clinic appointment.  Maggie Arwyn Besaw, RN, RD, CDE

## 2012-11-01 NOTE — Progress Notes (Signed)
Pulse: 79

## 2012-11-01 NOTE — Progress Notes (Signed)
Had a rash associated with cold symptoms and taking Tylenol. Resolved. May take Sudafed prn. FBS not recorded only testing BID, encouraged proper schedule QID Small dry skin tag at right nipple noted. BS <120

## 2012-11-15 ENCOUNTER — Ambulatory Visit (INDEPENDENT_AMBULATORY_CARE_PROVIDER_SITE_OTHER): Payer: Self-pay | Admitting: Family Medicine

## 2012-11-15 VITALS — BP 101/65 | Temp 97.0°F | Wt 151.5 lb

## 2012-11-15 DIAGNOSIS — O99810 Abnormal glucose complicating pregnancy: Secondary | ICD-10-CM

## 2012-11-15 DIAGNOSIS — O24419 Gestational diabetes mellitus in pregnancy, unspecified control: Secondary | ICD-10-CM

## 2012-11-15 DIAGNOSIS — O0990 Supervision of high risk pregnancy, unspecified, unspecified trimester: Secondary | ICD-10-CM

## 2012-11-15 LAB — POCT URINALYSIS DIP (DEVICE)
Bilirubin Urine: NEGATIVE
Nitrite: NEGATIVE
Urobilinogen, UA: 0.2 mg/dL (ref 0.0–1.0)
pH: 6.5 (ref 5.0–8.0)

## 2012-11-15 NOTE — Progress Notes (Signed)
FBS 62-91 1 out of range 2 hr pp 64-141 7/26 out of range.  Reviewed diet

## 2012-11-15 NOTE — Patient Instructions (Signed)
Diabetes mellitus gestacional (Gestational Diabetes Mellitus) La diabetes mellitus gestacional se produce slo durante el embarazo. Aparece cuando el organismo no puede controlar adecuadamente la glucosa (azcar) que aumenta en la sangre despus de comer. Durante el embarazo, se produce una resistencia a la insulina (sensibilidad reducida a la insulina) debido a la liberacin de hormonas por parte de la placenta. Generalmente, el pncreas de una mujer embarazada produce la cantidad suficiente de insulina para vencer esa resistencia. Sin embargo, en la diabetes gestacional, hay insulina pero no cumple su funcin adecuadamente. Si la resistencia es lo suficientemente grave como para que el pncreas no produzca la cantidad de insulina suficiente, la glucosa extra se acumula en la sangre.  QUINES TIENEN RIESGO DE DESARROLLAR DIABETES GESTACIONAL?  Las mujeres con historia de diabetes en la familia.  Las mujeres de ms de 25 aos.  Las que presentan sobrepeso.  Las mujeres que pertenecen a ciertos grupos tnicos (latinas, afroamericanas, norteamericanas nativas, asiticas y las originarias de las islas del Pacf[employer]. QUE PUEDE OCURRIRLE AL BEB? Si el nivel de glucosa en sangre de la madre es demasiado elevado mientras este embarazada, el nivel extra de azcar pasar por el cordn umbilical hacia el beb. Algunos de los problemas del beb pueden ser:  Beb demasiado grande: si el nio recibe demasiada azcar, puede aumentar mucho de peso. Esto puede hacer que sea demasiado grande para nacer por parto normal (vaginal) por lo que ser necesario realizar una cesrea.  Bajo nivel de glucosa (hipoglucemia): el beb produce insulina extra en respuesta a la excesiva cantidad de azcar que obtiene de la madre. Cuando el beb nace y ya no necesita insulina extra, su nivel de azcar en sangre puede disminuir.  Ictericia (coloracin amarillenta de la piel y los ojos): esto es bastante frecuente en los bebs.  La causa es la acumulacin de una sustancia qumica denominada bilirrubina. No siempre es un trastorno grave, pero se observa con frecuencia en los bebs cuyas madres sufren diabetes gestacional. RIESGOS PARA LA MADRE Las mujeres que han sufrido diabetes gestacional pueden tener ms riesgos para algunos problemas como:  Preeclampsia o toxemia, incluyendo problemas con hipertensin arterial. La presin arterial y los niveles de protenas en la orina deben controlarse con frecuencia.  Infecciones  Parto por cesrea.  Aparicin de diabetes tipo 2 en una etapa posterior de la vida. Alrededor del 30% al 50% sufrir diabetes posteriormente, especialmente las que son obesas. DIAGNSTICO Las hormonas que causan resistencia a la insulina tienen su mayor nivel alrededor de las 24 a 28 semanas del embarazo. Si se experimentan sntomas, stos son similares a los sntomas que normalmente aparecen durante el embarazo.  La diabetes mellitus gestacional generalmente se diagnostica por medio de un mtodo en dos partes: 1. Despus de la 24 a 28 semanas de embarazo, la mujer debe beber una solucin que contiene glucosa y realizar un anlisis de sangre. Si el nivel de glucosa es elevado, la realizarn un segundo anlisis. 2. La prueba oral de tolerancia a la glucosa, que dura aproximadamente tres horas. Despus de realizar ayuno durante la noche, se controla nivel de glucosa en sangre. La mujer bebe una solucin que contiene glucosa y le realizan anlisis de glucosa en sangre cada hora. Si la mujer tiene factores de riesgos para la diabetes mellitus gestacional, el mdico podr indicar el anlisis antes de las 24 semanas de embarazo. TRATAMIENTO El tratamiento est dirigido a mantener la glucosa en sangre de la madre en un nivel normal y puede incluir:  La   planificacin de los alimentos.  Recibir insulina u otro medicamento para controlar el nivel de glucosa en sangre.  La prctica de ejercicios.  Llevar un  registro diario de los alimentos que consume.  Control y registro de los niveles de glucosa en sangre.  Control de los niveles de cetona en la orina, aunque esto ya no se considera necesario en la mayora de los embarazos. INSTRUCCIONES PARA EL CUIDADO DOMICILIARIO Mientras est embarazada:  Siga los consejos de su mdico relacionados con los controles prenatales, la planificacin de la comida, la actividad fsica, los medicamentos, vitaminas, los anlisis de sangre y otras pruebas y las actividades fsicas.  Lleve un registro de las comidas, las pruebas de glucosa en sangre y la cantidad de insulina que recibe (si corresponde). Muestre todo al profesional en cada consulta mdica prenatal.  Si sufre diabetes mellitus gestacional, podr tener problemas de hipoglucemia (nivel bajo de glucosa en sangre). Podr sospechar este problema si se siente repentinamente mareada, tiene temblores y/o se siente dbil. Si cree que esto le est ocurriendo, y tiene un medidor de glucosa, mida su nivel de glucosa en sangre. Siga los consejos de su mdico sobre el modo y el momento de tratar su nivel de glucosa en sangre. Generalmente se sigue la regla 15:15 Consuma 15 g de hidratos de carbono, espere 15 minutos y vuelva controlar el nivel de glucosa en sangre.. Ejemplos de 15 g de hidratos de carbono son:  1 taza de leche descremada.   taza de jugo.  3-4 tabletas de glucosa.  5-6 caramelos duros.  1 caja pequea de pasas de uva.   taza de gaseosa comn.  Mantenga una buena higiene para evitar infecciones.  No fume. SOLICITE ATENCIN MDICA SI:  Observa prdida vaginal con o sin picazn.  Se siente ms dbil o cansada que lo habitual.  Transpira mucho.  Tiene un aumento de peso repentino, 2,5 kg o ms en una semana.  Pierde peso, 1.5 kg o ms en una semana.  Su nivel de glucosa en sangre es elevado, necesita instrucciones. SOLICITE ATENCIN MDICA DE INMEDIATO SI:  Sufre una cefalea  intensa.  Se marea o pierde el conocimiento  Presenta nuseas o vmitos.  Se siente desorientada confundida.  Sufre convulsiones.  Tiene problemas de visin.  Siente dolor en el estmago.  Presenta una hemorragia vaginal abundante.  Tiene contracciones uterinas.  Tiene una prdida importante de lquido por la vagina DESPUS QUE NACE EL BEB:  Concurra a todos los controles de seguimiento y realice los anlisis de sangre segn las indicaciones de su mdico.  Mantenga un estilo de vida saludable para evitar la diabetes en el futuro. Aqu se incluye:  Siga el plan de alimentacin saludable.  Controle su peso.  Practique actividad fsica y descanse lo necesario.  No fume.  Amamante a su beb mientras pueda. Esto disminuir la probabilidad de que usted y su beb sufran diabetes posteriormente. Para ms informacin acerca de la diabetes, visite la pgina web de la American Diabetes Association: www.americandiabetesassociation.org. Para ms informacin acerca de la diabetes gestacional cite la pgina web del American Congress of Obstetricians and Gynecologists en: www.acog.org. Document Released: 09/17/2005 Document Revised: 03/01/2012 ExitCare Patient Information 2013 [employer].  Lactancia materna  (Breastfeeding) Decidir amamantar es una de las mejores elecciones que puede hacer por usted y su beb. La informacin que se brinda a continuacin le dar una breve visin de los beneficios de la lactancia materna as como de las dudas ms frecuentes alrededor de ella.    LOS BENEFICIOS DE AMAMANTAR  Para el beb   La primera leche (calostro ) ayuda al mejor funcionamiento del sistema digestivo del beb.   La leche tiene anticuerpos que provienen de la madre y que ayudan a prevenir las infecciones en el beb.   El beb tiene una menor incidencia de asma, alergias y del sndrome de muerte sbita del lactante (SMSL).   Los nutrientes en la leche materna son mejores para  el beb que los preparados para lactantes y la leche materna ayuda a un mejor desarrollo del cerebro del beb.   Los bebs amamantados tienen menos gases, clicos y estreimiento.  Para la mam   La lactancia materna favorece el desarrollo de un vnculo muy especial entre la madre y el beb.   Es ms conveniente, siempre disponible y a la temperatura adecuada y econmico.   Consume caloras en la madre y la ayuda a perder el peso ganado durante el embarazo.   Favorece la contraccin del tero a su tamao normal, de manera ms rpida y disminuye las hemorragias luego del parto.   Las madres que amamantan tienen menor riesgo de desarrollar cncer de mama.  FRECUENCIA DEL AMAMANTAMIENTO   Un beb sano, nacido a trmino, puede amamantarse con tanta frecuencia como cada hora, o espaciar las comidas cada tres horas.   Observe al beb cuando manifieste signos de hambre. Amamante a su beb si muestra signos de hambre. Esta frecuencia variar de un beb a otro.   Amamntelo tan seguido como el beb lo solicite, o cuando usted sienta la necesidad de aliviar sus mamas.   Despierte al beb si han pasado 3  4 horas desde la ltima comida.   El amamantamiento frecuente la ayudar a producir ms leche y a prevenir problemas de dolor en los pezones e hinchazn de las mamas.  POSICIN DEL BEBE PARA EL AMAMANTAMIENTO   Ya sea que se encuentre acostada o sentada, asegrese que el abdomen del beb enfrente el suyo.   Sostenga la mama con el pulgar por arriba y los otros 4 dedos por debajo. Asegrese que sus dedos se encuentren lejos del pezn y de la boca del beb.   Empuje suavemente los labios del beb con el pezn o con el dedo.   Cuando la boca del beb se abra lo suficiente, introduzca el pezn y la areola tanto como le sea posible dentro de la boca.   Coloque al beb cerca suyo de modo que su nariz y mejillas toquen las mamas al mamar.  ALIMENTACIN Y SUCCIN   La duracin  de cada comida vara de un beb a otro y de una comida a otra.   El beb debe succionar entre 2 y 3 minutos para que le llegue leche. Esto se denomina "bajada". Por este motivo, permita que el nio se alimente en cada mama todo lo que desee. Terminar de mamar cuando haya recibido la cantidad adecuada de nutrientes.   Para detener la succin coloque su dedo en la comisura de la boca del nio y deslcelo entre sus encas antes de quitarle la mama de la boca. Esto la ayudar a evitar el dolor en los pezones.  COMO SABER SI EL BEB OBTIENE LA SUFICIENTE LECHE MATERNA  Preguntarse si el beb obtiene la cantidad suficiente de leche es una preocupacin frecuente entre las madres. Puede asegurarse que el beb tiene la leche suficiente si:   El beb succiona activamente y usted escucha que traga .   El beb parece estar   relajado y satisfecho despus de mamar.   El nio se alimenta al menos 8 a 12 veces en 24 horas. Alimntelo hasta que se desprenda por sus propios medios o se quede dormido en la primera mama (al menos durante 10 a 20 minutos), luego ofrzcale el otro lado.   El beb moja 5 a 6 paales desechables (6 a 8 paales de tela) en 24 horas cuando tiene 5  6 das de vida.   Tiene al menos 3 a 4 deposiciones todos los das en los primeros meses. La materia fecal debe ser blanda y amarillenta.   El beb debe aumentar 4 a 6 libras (120 a 170 gr.) por semana despus de los 4 das de vida.   Siente que las mamas se ablandan despus de amamantar  REDUCIR LA CONGESTIN DE LAS MAMAS   Durante la primera semana despus del parto, usted puede experimentar hinchazn en las mamas. Cuando las mamas estn congestionadas, se sienten calientes, llenas y molestas al tacto. Puede reducir la congestin si:   Lo amamanta frecuentemente, cada 2-3 horas. Las mams que amamantan pronto y con frecuencia tienen menos problemas de congestin.   Coloque compresas de hielo en sus mamas durante 10-20  minutos entre cada amamantamiento. Esto ayuda a reducir la hinchazn. Envuelva las bolsas de hielo en una toalla liviana para proteger su piel. Las bolsas de vegetales congelados funcionan bien para este propsito.   Tome una ducha tibia o aplique compresas hmedas calientes en las mamas durante 5 a 10 minutos antes de cada vez que amamanta. Esto aumenta la circulacin y ayuda a que la leche fluya.   Masajee suavemente la mama antes y durante la alimentacin. Con las puntas de los dedos, masajee desde la pared torcica hacia abajo hasta llegar al pezn, con movimientos circulares.   Asegrese que el nio vaca al menos una mama antes de cambiar de lado.   Use un sacaleche para vaciar la mama si el beb se duerme o no se alimenta bien. Tambin podr quitarse la leche con esa bomba si tiene que volver al trabajo o siente que las mamas estn congestionadas.   Evite los biberones, chupetes o complementar la alimentacin con agua o jugos en lugar de la leche materna. La leche materna es todo el alimento que el beb necesita. No es necesario que el nio ingiera agua o preparados de bibern. De hecho, es lo mejor para ayudar a que las mamas produzcan ms leche. no darle suplementos al nio durante las primeras semanas.   Verifique que el beb se encuentra en la posicin correcta mientras lo alimenta.   Use un sostn que soporte bien sus mamas y evite los que tienen aro.   Consuma una dieta balanceada y beba lquidos en cantidad.   Descanse con frecuencia, reljese y tome sus vitaminas prenatales para evitar la fatiga, el estrs y la anemia.  Si sigue estas indicaciones, la congestin debe mejorar en 24 a 48 horas. Si an tiene dificultades, consulte a su asesor en lactancia.  CUDESE USTED MISMA  Cuide sus mamas.   Bese o dchese diariamente.   Evite usar jabn en los pezones.   Comience a amamantar del lado izquierdo en una comida y del lado derecho en la siguiente.   Notar que  aumenta el flujo de leche a los 2 a 5 das despus del parto. Puede sentir algunas molestias por la congestin, lo que hace que sus mamas estn duras y sensibles. La congestin disminuye en 24 a 48   horas. Mientras tanto, aplique toallas hmedas calientes durante 5 a 10 minutos antes de amamantar. Un masaje suave y la extraccin de un poco de leche antes de amamantar ablandarn las mamas y har ms fcil que el beb se agarre.   Use un buen sostn y seque al aire los pezones durante 3 a 4 minutos luego de cada alimentacin.   Solo utilice apsitos de algodn.   Utilice lanolina pura sobre los pezones luego de amamantar. No necesita lavarlos luego de alimentar al nio. Otra opcin es exprimir algunas gotas de leche y masajear suavemente los pezones.  Cumpla con estos cuidados   Consuma alimentos bien balanceados y refrigerios nutritivos.   Beba leche, jugos de fruta y agua para satisfacer la sed (alrededor de 8 vasos por da).   Descanse lo suficiente.  Evite los alimentos que usted note que pueden afectar al beb.  SOLICITE ATENCIN MDICA SI:   Tiene dificultad con la lactancia materna y necesita ayuda.   Tiene una zona de color rojo, dura y dolorosa en la mama que se acompaa de fiebre.   El beb est muy somnoliento como para alimentarse bien o tiene problemas para dormir.   Su beb moja menos de 6 paales al da, a los 5 das de vida.   La piel del beb o la parte blanca de sus ojos est ms amarilla de lo que estaba en el hospital.   Se siente deprimida.  Document Released: 12/08/2005 Document Revised: 06/08/2012 ExitCare Patient Information 2013 [employer].  

## 2012-11-15 NOTE — Progress Notes (Signed)
Pulse: 89

## 2012-11-29 ENCOUNTER — Ambulatory Visit (INDEPENDENT_AMBULATORY_CARE_PROVIDER_SITE_OTHER): Payer: Self-pay | Admitting: Obstetrics & Gynecology

## 2012-11-29 VITALS — BP 95/57 | Temp 96.2°F | Wt 151.6 lb

## 2012-11-29 DIAGNOSIS — O99810 Abnormal glucose complicating pregnancy: Secondary | ICD-10-CM

## 2012-11-29 DIAGNOSIS — Z23 Encounter for immunization: Secondary | ICD-10-CM

## 2012-11-29 LAB — CBC
MCH: 25.9 pg — ABNORMAL LOW (ref 26.0–34.0)
MCV: 78.5 fL (ref 78.0–100.0)
Platelets: 190 10*3/uL (ref 150–400)
RBC: 3.82 MIL/uL — ABNORMAL LOW (ref 3.87–5.11)
RDW: 13.9 % (ref 11.5–15.5)
WBC: 6 10*3/uL (ref 4.0–10.5)

## 2012-11-29 LAB — POCT URINALYSIS DIP (DEVICE)
Glucose, UA: NEGATIVE mg/dL
Nitrite: NEGATIVE
Protein, ur: NEGATIVE mg/dL
Urobilinogen, UA: 0.2 mg/dL (ref 0.0–1.0)

## 2012-11-29 MED ORDER — TETANUS-DIPHTH-ACELL PERTUSSIS 5-2.5-18.5 LF-MCG/0.5 IM SUSP
0.5000 mL | Freq: Once | INTRAMUSCULAR | Status: AC
  Administered 2012-11-29: 0.5 mL via INTRAMUSCULAR

## 2012-11-29 NOTE — Progress Notes (Signed)
P=, c/o [employer], c/o edema in feet at end of day,

## 2012-11-29 NOTE — Patient Instructions (Signed)
Informacin sobre el dispositivo intrauterino  (Intrauterine Device Information) El dispositivo intrauterino (DIU) se inserta en el tero e impide el embarazo. Hay dos tipos de DIU:   DIU de cobre. Este tipo de DIU est recubierto con un alambre de cobre y se inserta dentro del tero. El cobre hace que el tero y las trompas de [place] produzcan un liquido que destruye los espermatozoides. El DIU de cobre puede permanecer en el lugar durante 10 aos.  DIU hormonal. Este tipo de DIU contiene la hormona progestina (progesterona sinttica). La hormona espesa el moco cervical y evita que los espermatozoides ingresen al tero y tambin afina la membrana que cubre el tero para evitar la implantacin del vulo fertilizado. La hormona debilita o destruye los espermatozoides que ingresan al tero. El DIU hormonal puede permanecer en el lugar durante 5 aos. El mdico se asegurar de que usted es una buena candidata para usar el DIU cono anticonceptivo. Hable con su mdico acerca de los posibles efectos secundarios.  VENTAJAS  Es muy eficaz, reversible, de accin prolongada y de bajo mantenimiento.  No hay efectos secundarios relacionados con el estrgeno.  El DIU puede ser utilizado durante la lactancia.  No est asociado con el aumento de peso.  Funciona inmediatamente despus de la insercin.  El DIU de cobre no interfiere con las hormonas femeninas.  El DIU con progesterona puede hacer que los perodos menstruales no sean tan abundantes.  El DIU de progesterona puede usarse durante 5 aos.  El DIU de cobre puede usarse durante 10 aos. DESVENTAJAS  El DIU de progesterona puede estar asociado con patrones de sangrado irregular.  El DIU de cobre puede hacer que el flujo menstrual ms abundante y doloroso.  Puede experimentar clicos y sangrado vaginal despus de la insercin. Document Released: 05/28/2010 Document Revised: 03/01/2012 Exit[employer] Patient Information 2013 [employer].  

## 2012-11-29 NOTE — Progress Notes (Signed)
Fastings WNL; most pp are < 120 (a few are in the 120s).  Pt interested in BTL; husband not interested in vasectomy.  Will have pt meet with SW to see how long the list is for free BTL

## 2012-11-30 LAB — RPR

## 2012-12-03 DIAGNOSIS — O0990 Supervision of high risk pregnancy, unspecified, unspecified trimester: Secondary | ICD-10-CM

## 2012-12-08 ENCOUNTER — Inpatient Hospital Stay (HOSPITAL_COMMUNITY)
Admission: AD | Admit: 2012-12-08 | Discharge: 2012-12-08 | Disposition: A | Discharge status: Home / Self Care | Payer: Self-pay | Source: Ambulatory Visit | Attending: Obstetrics & Gynecology | Admitting: Obstetrics & Gynecology

## 2012-12-08 ENCOUNTER — Encounter (HOSPITAL_COMMUNITY): Payer: Self-pay | Admitting: *Deleted

## 2012-12-08 ENCOUNTER — Inpatient Hospital Stay (HOSPITAL_COMMUNITY): Payer: Self-pay

## 2012-12-08 DIAGNOSIS — O4690 Antepartum hemorrhage, unspecified, unspecified trimester: Secondary | ICD-10-CM | POA: Insufficient documentation

## 2012-12-08 DIAGNOSIS — O99810 Abnormal glucose complicating pregnancy: Secondary | ICD-10-CM | POA: Insufficient documentation

## 2012-12-08 DIAGNOSIS — O209 Hemorrhage in early pregnancy, unspecified: Secondary | ICD-10-CM

## 2012-12-08 DIAGNOSIS — R1084 Generalized abdominal pain: Secondary | ICD-10-CM

## 2012-12-08 DIAGNOSIS — B373 Candidiasis of vulva and vagina: Secondary | ICD-10-CM

## 2012-12-08 DIAGNOSIS — N949 Unspecified condition associated with female genital organs and menstrual cycle: Secondary | ICD-10-CM

## 2012-12-08 HISTORY — DX: Gestational diabetes mellitus in pregnancy, unspecified control: O24.419

## 2012-12-08 LAB — URINALYSIS, ROUTINE W REFLEX MICROSCOPIC
Glucose, UA: NEGATIVE mg/dL
Ketones, ur: 15 mg/dL — AB
Protein, ur: NEGATIVE mg/dL
Urobilinogen, UA: 4 mg/dL — ABNORMAL HIGH (ref 0.0–1.0)

## 2012-12-08 LAB — WET PREP, GENITAL
Clue Cells Wet Prep HPF POC: NONE SEEN
Yeast Wet Prep HPF POC: NONE SEEN

## 2012-12-08 LAB — URINE MICROSCOPIC-ADD ON

## 2012-12-08 MED ORDER — FLUCONAZOLE 150 MG PO TABS: 150.0000 mg | ORAL_TABLET | Freq: Once | ORAL | Status: DC

## 2012-12-08 MED ORDER — FLUCONAZOLE 150 MG PO TABS
150.0000 mg | ORAL_TABLET | Freq: Once | ORAL | Status: AC
  Administered 2012-12-08: 150 mg via ORAL
  Filled 2012-12-08: qty 1

## 2012-12-08 NOTE — MAU Note (Signed)
To US

## 2012-12-08 NOTE — MAU Note (Signed)
At 11:30 today, noticed dark brown discharge on panties with some lower abdominal discomfort. The discomfort is intermittent but does not feel like UCs. Describes the pain as sharp, 4 on scale. She states the exact same symptoms occurred Friday but resolved.

## 2012-12-08 NOTE — MAU Note (Signed)
Pt states via Rosey Bath, translator that she had bleeding Friday, also had bleeding today, small amount, notes lower abd pain bilaterally. Denies n/v/d. No abnormal vaginal d/c changes besides blood noted. Blood brown colored on underwear, then more noted, brown colored.

## 2012-12-08 NOTE — MAU Provider Note (Signed)
History     CSN: [id number]  Arrival date and time: 12/08/12 1611   None     Chief Complaint  Patient presents with  . Vaginal Bleeding  . Abdominal Pain   HPI: Pt is a 31 y.o. [id number] at [redacted]w[redacted]d who presents with vaginal bleeding/discharge noted around 1130 this morning. Identical symptoms last Friday, and repeat of symptoms today prompted her to come to MAU. Pt is Spanish-speaking, interpreter services utilized. Pt states she had some spotting/discharge that she noticed this morning, red/brown initially, then just brown staining her panty-liner. Discharge was noted to be greater in amount than bleeding, which is described as spotting. Pt had never had this before during the pregnancy. Last intercourse was 1-2 weeks ago. Pt states she had some white discharge off and on earlier in pregnancy and was treated once with Monistat for 7 days. Denies contractions but endorses some low-abdominal pain that has been on-and-off, sharp; pain is NOT worse or aggravated by standing/moving around/walking. Pt has not taken any medication for the pain and per previous notes has had allergic reactions to Tylenol in the past.  Pt reports good fetal movements. No gush or LOF. Bleeding/discharge as above. Otherwise denies complaints. No fever/chills, N/V, change in vision, headache.  Pt's PCP is Dr. Louanne Belton of [facility], but she transferred prenatal care to [facility] for gestational diabetes, currently diet controlled. Pregnancy has been relatively uncomplicated.  OB History    Grav Para Term Preterm Abortions TAB SAB Ect Mult Living   5 4 4       4       Past Medical History  Diagnosis Date  . No pertinent past medical history   . Gestational diabetes     Past Surgical History  Procedure Date  . No past surgeries     Family History  Problem Relation Age of Onset  . Diabetes Maternal Uncle     History  Substance Use Topics  . Smoking status: Never Smoker   . Smokeless tobacco: Not on file  . Alcohol  Use: No    Allergies:  Allergies  Allergen Reactions  . Gelatin Itching  . Nyquil (Pseudoeph-Doxylamine-Dm-Apap) Other (See Comments)    Rash,itching and face swells  . Nyquil (Pseudoeph-Doxylamine-Dm-Apap) Hives  . Tylenol (Acetaminophen) Itching, Swelling and Rash    Prescriptions prior to admission  Medication Sig Dispense Refill  . Prenatal Vit-Fe Fumarate-FA (MULTIVITAMIN-PRENATAL) 27-0.8 MG TABS Take 1 tablet by mouth daily.        ROS Physical Exam   Blood pressure 108/61, pulse 68, temperature 97.8 F (36.6 C), temperature source Oral, resp. rate 18, height 5\' 2"  (1.575 m), weight 69.174 kg (152 lb 8 oz), last menstrual period 05/14/2012.  Physical Exam  Vitals reviewed. Constitutional: She is oriented to person, place, and time. She appears well-developed and well-nourished. No distress.  HENT:  Head: Normocephalic and atraumatic.  Eyes: Conjunctivae normal are normal. Pupils are equal, round, and reactive to light.  Neck: Normal range of motion. Neck supple.  Cardiovascular: Normal rate, regular rhythm and normal heart sounds.   No murmur heard. Respiratory: Effort normal and breath sounds normal. She has no wheezes.  GI: Soft. Bowel sounds are normal. There is tenderness (some epigastric tenderness, as well as low-abdominal bilaterally). There is no rebound and no guarding.       Gravid  Genitourinary: Vagina normal. Pelvic exam was performed with patient prone. Cervix exhibits motion tenderness, discharge (Bloody, whitish) and friability (GC/Chlamydia probe withdrawn from external os bloody).  Sterile speculum exam reveals subjectively large cervix with white discharge, no pooling or gross leakage of fluid, no gross/brisk bleeding from os but some friability from GC/Chlamydia and wet prep swabs.  Some low-abdominal tenderness and CMT on bimanual exam. Digital exam below. Dilation: Closed Effacement (%): Thick Cervical Position: Posterior Station: -3 Exam  by:: Dr. Casper Harrison   Musculoskeletal: Normal range of motion. She exhibits no edema.  Neurological: She is alert and oriented to person, place, and time.  Skin: Skin is warm and dry. No erythema.  Psychiatric: She has a normal mood and affect. Her behavior is normal.    MAU Course  Procedures -sterile speculum exam -bimanual exam -wet prep and CG/Chlamydia probe -UA/culture -US OB limited  MDM -FHR tracing reassuring, baseline 125-135, moderate variability, present accels -yeast on UA, culture also sent on -cervix visualized closed and closed on digital exam / thick / firm  Assessment and Plan  31 y.o. [id number] at [redacted]w[redacted]d -SIUP complicated by gestational diabetes; now here with cervical bleeding  -apparent yeast infection, will treat with Diflucan 150 mg x1 now and give Rx for repeat dose in 4 days.  -US shows cervical length 3.3 cm, fundal placenta; final report pending  -preterm labor precautions reviewed, pt to follow up in clinic next Monday  Above discussed with Dr. Thad Ranger.  Aimee Timmons 12/08/2012, 6:50 PM

## 2012-12-09 NOTE — MAU Provider Note (Signed)
I have seen and examined patient and agree with above. Patient states she did have intercourse in last 24 hours. She denies and specific contractions. I personally reviewed fetal heart tracing and tocometry. Baseline: 120-125, accelerations present, occasional variable deceleration (10 seconds). Rare contraction, some uterine irritability. Labor precautions discussed at length. Patient verbalized understanding. Translator present. Napoleon Form, MD

## 2012-12-10 LAB — URINE CULTURE

## 2012-12-13 ENCOUNTER — Encounter: Payer: Self-pay | Admitting: Obstetrics and Gynecology

## 2012-12-22 NOTE — L&D Delivery Note (Signed)
Kimberly Avila is a 32 y.o. [id number] presenting at [redacted]w[redacted]d for IOL for A2/B diabetes during pregnancy. She was induced with pitocin and AROM and progressed precipitously to complete dilation.  Delivery Note At 3:15 PM a viable female was delivered via Vaginal, Spontaneous Delivery (Presentation: Right Occiput Anterior).  APGAR: 8, 9; weight pending.   Placenta status: Intact, Spontaneous.  Cord: 3 vessels with the following complications: None.  Cord pH: n/a  Anesthesia: Epidural  Episiotomy: None Lacerations: None Suture Repair: na Est. Blood Loss (mL): 350  Mom to postpartum.  Baby to nursery-stable.  Napoleon Form 02/15/2013, 3:56 PM

## 2012-12-27 ENCOUNTER — Ambulatory Visit (INDEPENDENT_AMBULATORY_CARE_PROVIDER_SITE_OTHER): Payer: Self-pay | Admitting: Obstetrics and Gynecology

## 2012-12-27 ENCOUNTER — Encounter: Payer: Self-pay | Admitting: Obstetrics and Gynecology

## 2012-12-27 VITALS — BP 103/67 | Wt 152.6 lb

## 2012-12-27 DIAGNOSIS — O99810 Abnormal glucose complicating pregnancy: Secondary | ICD-10-CM

## 2012-12-27 DIAGNOSIS — R7309 Other abnormal glucose: Secondary | ICD-10-CM

## 2012-12-27 LAB — POCT URINALYSIS DIP (DEVICE)
Glucose, UA: NEGATIVE mg/dL
Hgb urine dipstick: NEGATIVE
Specific Gravity, Urine: 1.02 (ref 1.005–1.030)
Urobilinogen, UA: 0.2 mg/dL (ref 0.0–1.0)
pH: 7 (ref 5.0–8.0)

## 2012-12-27 NOTE — Progress Notes (Signed)
CBGs reviewed. F 78-88 and 97 x1. PP had within range except a 182 and 137 related to dietary indiscretion > see Maggie. Had LGA> will get EFW later 3rd tri. Cannot afford BTL> optioons reviewed.

## 2012-12-27 NOTE — Patient Instructions (Signed)
Diabetes mellitus gestacional (Gestational Diabetes Mellitus) La diabetes mellitus gestacional se produce slo durante el embarazo. Aparece cuando el organismo no puede controlar adecuadamente la glucosa (azcar) que aumenta en la sangre despus de comer. Durante el embarazo, se produce una resistencia a la insulina (sensibilidad reducida a la insulina) debido a la liberacin de hormonas por parte de la placenta. Generalmente, el pncreas de una mujer embarazada produce la cantidad suficiente de insulina para vencer esa resistencia. Sin embargo, en la diabetes gestacional, hay insulina pero no cumple su funcin adecuadamente. Si la resistencia es lo suficientemente grave como para que el pncreas no produzca la cantidad de insulina suficiente, la glucosa extra se acumula en la sangre.  QUINES TIENEN RIESGO DE DESARROLLAR DIABETES GESTACIONAL?  Las mujeres con historia de diabetes en la familia.  Las mujeres de ms de 25 aos.  Las que presentan sobrepeso.  Las mujeres que pertenecen a ciertos grupos tnicos (latinas, afroamericanas, norteamericanas nativas, asiticas y las originarias de las islas del Pac[employer]. QUE PUEDE OCURRIRLE AL BEB? Si el nivel de glucosa en sangre de la madre es demasiado elevado mientras este embarazada, el nivel extra de azcar pasar por el cordn umbilical hacia el beb. Algunos de los problemas del beb pueden ser:  Beb demasiado grande: si el nio recibe demasiada azcar, puede aumentar mucho de peso. Esto puede hacer que sea demasiado grande para nacer por parto normal (vaginal) por lo que ser necesario realizar una cesrea.  Bajo nivel de glucosa (hipoglucemia): el beb produce insulina extra en respuesta a la excesiva cantidad de azcar que obtiene de la madre. Cuando el beb nace y ya no necesita insulina extra, su nivel de azcar en sangre puede disminuir.  Ictericia (coloracin amarillenta de la piel y los ojos): esto es bastante frecuente en los bebs. La  causa es la acumulacin de una sustancia qumica denominada bilirrubina. No siempre es un trastorno grave, pero se observa con frecuencia en los bebs cuyas madres sufren diabetes gestacional. RIESGOS PARA LA MADRE Las mujeres que han sufrido diabetes gestacional pueden tener ms riesgos para algunos problemas como:  Preeclampsia o toxemia, incluyendo problemas con hipertensin arterial. La presin arterial y los niveles de protenas en la orina deben controlarse con frecuencia.  Infecciones  Parto por cesrea.  Aparicin de diabetes tipo 2 en una etapa posterior de la vida. Alrededor del 30% al 50% sufrir diabetes posteriormente, especialmente las que son obesas. DIAGNSTICO Las hormonas que causan resistencia a la insulina tienen su mayor nivel alrededor de las 24 a 28 semanas del embarazo. Si se experimentan sntomas, stos son similares a los sntomas que normalmente aparecen durante el embarazo.  La diabetes mellitus gestacional generalmente se diagnostica por medio de un mtodo en dos partes: 1. Despus de la 24 a 28 semanas de embarazo, la mujer debe beber una solucin que contiene glucosa y realizar un anlisis de sangre. Si el nivel de glucosa es elevado, la realizarn un segundo anlisis. 2. La prueba oral de tolerancia a la glucosa, que dura aproximadamente tres horas. Despus de realizar ayuno durante la noche, se controla nivel de glucosa en sangre. La mujer bebe una solucin que contiene glucosa y le realizan anlisis de glucosa en sangre cada hora. Si la mujer tiene factores de riesgos para la diabetes mellitus gestacional, el mdico podr indicar el anlisis antes de las 24 semanas de embarazo. TRATAMIENTO El tratamiento est dirigido a mantener la glucosa en sangre de la madre en un nivel normal y puede incluir:  La   planificacin de los alimentos.  Recibir insulina u otro medicamento para controlar el nivel de glucosa en sangre.  La prctica de ejercicios.  Llevar un  registro diario de los alimentos que consume.  Control y registro de los niveles de glucosa en sangre.  Control de los niveles de cetona en la orina, aunque esto ya no se considera necesario en la mayora de los embarazos. INSTRUCCIONES PARA EL CUIDADO DOMICILIARIO Mientras est embarazada:  Siga los consejos de su mdico relacionados con los controles prenatales, la planificacin de la comida, la actividad fsica, los medicamentos, vitaminas, los anlisis de sangre y otras pruebas y las actividades fsicas.  Lleve un registro de las comidas, las pruebas de glucosa en sangre y la cantidad de insulina que recibe (si corresponde). Muestre todo al profesional en cada consulta mdica prenatal.  Si sufre diabetes mellitus gestacional, podr tener problemas de hipoglucemia (nivel bajo de glucosa en sangre). Podr sospechar este problema si se siente repentinamente mareada, tiene temblores y/o se siente dbil. Si cree que esto le est ocurriendo, y tiene un medidor de glucosa, mida su nivel de glucosa en sangre. Siga los consejos de su mdico sobre el modo y el momento de tratar su nivel de glucosa en sangre. Generalmente se sigue la regla 15:15 Consuma 15 g de hidratos de carbono, espere 15 minutos y vuelva controlar el nivel de glucosa en sangre.. Ejemplos de 15 g de hidratos de carbono son:  1 taza de leche descremada.   taza de jugo.  3-4 tabletas de glucosa.  5-6 caramelos duros.  1 caja pequea de pasas de uva.   taza de gaseosa comn.  Mantenga una buena higiene para evitar infecciones.  No fume. SOLICITE ATENCIN MDICA SI:  Observa prdida vaginal con o sin picazn.  Se siente ms dbil o cansada que lo habitual.  Transpira mucho.  Tiene un aumento de peso repentino, 2,5 kg o ms en una semana.  Pierde peso, 1.5 kg o ms en una semana.  Su nivel de glucosa en sangre es elevado, necesita instrucciones. SOLICITE ATENCIN MDICA DE INMEDIATO SI:  Sufre una cefalea  intensa.  Se marea o pierde el conocimiento  Presenta nuseas o vmitos.  Se siente desorientada confundida.  Sufre convulsiones.  Tiene problemas de visin.  Siente dolor en el estmago.  Presenta una hemorragia vaginal abundante.  Tiene contracciones uterinas.  Tiene una prdida importante de lquido por la vagina DESPUS QUE NACE EL BEB:  Concurra a todos los controles de seguimiento y realice los anlisis de sangre segn las indicaciones de su mdico.  Mantenga un estilo de vida saludable para evitar la diabetes en el futuro. Aqu se incluye:  Siga el plan de alimentacin saludable.  Controle su peso.  Practique actividad fsica y descanse lo necesario.  No fume.  Amamante a su beb mientras pueda. Esto disminuir la probabilidad de que usted y su beb sufran diabetes posteriormente. Para ms informacin acerca de la diabetes, visite la pgina web de la American Diabetes Association: www.americandiabetesassociation.org. Para ms informacin acerca de la diabetes gestacional cite la pgina web del American Congress of Obstetricians and Gynecologists en: www.acog.org. Document Released: 09/17/2005 Document Revised: 03/01/2012 Exit[employer] Patient Information 2013 [employer].  

## 2012-12-27 NOTE — Progress Notes (Signed)
Pulse- 96 

## 2013-01-10 ENCOUNTER — Ambulatory Visit (INDEPENDENT_AMBULATORY_CARE_PROVIDER_SITE_OTHER): Payer: Self-pay | Admitting: Obstetrics & Gynecology

## 2013-01-10 VITALS — BP 111/70 | Temp 96.0°F | Wt 153.6 lb

## 2013-01-10 DIAGNOSIS — O99810 Abnormal glucose complicating pregnancy: Secondary | ICD-10-CM

## 2013-01-10 DIAGNOSIS — O24410 Gestational diabetes mellitus in pregnancy, diet controlled: Secondary | ICD-10-CM

## 2013-01-10 LAB — POCT URINALYSIS DIP (DEVICE)
Bilirubin Urine: NEGATIVE
Hgb urine dipstick: NEGATIVE
Ketones, ur: NEGATIVE mg/dL
Nitrite: NEGATIVE
pH: 7.5 (ref 5.0–8.0)

## 2013-01-10 MED ORDER — GLYBURIDE 2.5 MG PO TABS: 2.5000 mg | ORAL_TABLET | Freq: Every day | ORAL | Status: DC

## 2013-01-10 NOTE — Progress Notes (Signed)
Pulse 88 Patient reports abdominal pain/pressure. Patient also reports one hour after eating her blood sugar becomes very high (153-160) with increased heart rate and she states she also feels a lot of discomfort & agitiation

## 2013-01-10 NOTE — Progress Notes (Signed)
Nsg note reviewed. fbs <94, PP after lunch 122-145, after supper 133-188. Begin glyburide 2.5 mg AM, twice weekly NST

## 2013-01-10 NOTE — Patient Instructions (Signed)
Diabetes mellitus gestacional (Gestational Diabetes Mellitus) La diabetes mellitus gestacional se produce slo durante el embarazo. Aparece cuando el organismo no puede controlar adecuadamente la glucosa (azcar) que aumenta en la sangre despus de comer. Durante el [place], se produce una resistencia a la insulina (sensibilidad reducida a la insulina) debido a la liberacin de hormonas por parte de la placenta. Generalmente, el pncreas de una mujer embarazada produce la cantidad suficiente de insulina para vencer esa resistencia. Sin embargo, en la diabetes gestacional, hay insulina pero no cumple su funcin adecuadamente. Si la resistencia es lo suficientemente grave como para que el pncreas no produzca la cantidad de insulina suficiente, la glucosa extra se acumula en la sangre.  Devota Pace RIESGO DE DESARROLLAR DIABETES GESTACIONAL?  Las mujeres con historia de diabetes en la familia.  Las mujeres de ms de [address]  Las que presentan sobrepeso.  Las [employer] pertenecen a ciertos grupos tnicos (latinas, afroamericanas, norteamericanas nativas, asiticas y las originarias de las islas del Pacfico. QUE PUEDE OCURRIRLE AL BEB? Si el nivel de glucosa en sangre de la madre es demasiado elevado mientras este [place], el nivel extra de azcar pasar por el cordn umbilical hacia el beb. Algunos de los problemas del beb pueden ser:  Beb demasiado grande: si el nio recibe Chief Strategy Officer, puede aumentar mucho de [place]. Esto puede hacer que sea demasiado grande para nacer por parto normal (vaginal) por lo que ser necesario realizar una cesrea.  Bajo nivel de glucosa (hipoglucemia): el beb produce insulina extra en respuesta a la excesiva cantidad de azcar que obtiene de [employer]. Cuando el beb nace y ya no necesita insulina extra, su nivel de azcar en sangre puede disminuir.  Ictericia (coloracin amarillenta de la piel y los ojos): esto es bastante frecuente en los bebs. La  causa es la acumulacin de una sustancia qumica denominada bilirrubina. No siempre es un trastorno grave, pero se observa con frecuencia en los bebs cuyas madres sufren diabetes gestacional. RIESGOS PARA LA MADRE Las mujeres que han sufrido diabetes gestacional pueden tener ms riesgos para algunos problemas como:  Preeclampsia o toxemia, incluyendo problemas con hipertensin arterial. La presin arterial y los niveles de protenas en la orina deben controlarse con frecuencia.  Infecciones  Parto por cesrea.  Aparicin de diabetes tipo 2 en una etapa posterior de la vida. Alrededor del 30% al 50% sufrir diabetes posteriormente, especialmente las que son obesas. DIAGNSTICO Las hormonas que causan resistencia a la insulina tienen su mayor nivel alrededor de las 24 a 28 semanas del Psychiatrist. Si se experimentan sntomas, stos son similares a los sntomas que normalmente aparecen durante el embarazo.  La diabetes mellitus gestacional generalmente se diagnostica por medio de un mtodo en dos partes: 1. Despus de la 24 a 28 semanas de Psychiatrist, la mujer debe beber una solucin que contiene glucosa y Education officer, environmental un anlisis de [place]. Si el nivel de glucosa es elevado, la realizarn un segundo [place]. 2. La prueba oral de tolerancia a la glucosa, que dura aproximadamente tres horas. Despus de realizar ayuno durante la noche, se controla nivel de glucosa en sangre. La mujer bebe una solucin que contiene glucosa y Chief Executive Officer realizan anlisis de glucosa en sangre cada hora. Si la mujer tiene factores de riesgos para la diabetes mellitus gestacional, el mdico podr Programme researcher, broadcasting/film/video anlisis antes de las 24 semanas de [place]. TRATAMIENTO El tratamiento est dirigido a Insurance underwriter en sangre de la madre en un nivel normal y puede incluir:  La  planificacin de los alimentos.  Recibir insulina u otro medicamento para Sales executive nivel de glucosa en [place].  La prctica de ejercicios.  Llevar un  registro diario de los alimentos que consume.  Control y Engineer, maintenance (IT) de los niveles de glucosa en [place].  Control de los niveles de cetona en la [place], [place] esto ya no se considera necesario en la mayora de los [place]. INSTRUCCIONES PARA EL CUIDADO DOMICILIARIO Mientras est embarazada:  Siga los consejos de su mdico relacionados con los controles prenatales, la planificacin de la comida, la actividad fsica, los [place], vitaminas, los anlisis de sangre y otras pruebas y las actividades fsicas.  Lleve un registro de las comidas, las pruebas de glucosa en sangre y la cantidad de insulina que recibe (si corresponde). Muestre todo al profesional en cada consulta mdica prenatal.  Si sufre diabetes mellitus gestacional, podr tener problemas de hipoglucemia (nivel bajo de glucosa en sangre). Podr sospechar este problema si se siente repentinamente mareada, tiene temblores y/o se siente dbil. Si cree que esto le est ocurriendo, y tiene un medidor de glucosa, mida su nivel de Event organiser. Siga los consejos de su mdico sobre el modo y el momento de tratar su nivel de glucosa en sangre. Generalmente se sigue la regla 15:15 Consuma 15 g de hidratos de carbono, espere 15 minutos y Programmer, systems el nivel de glucosa en [place].Barbara Cower de 15 g de hidratos de carbono son:  1 taza de [employer].   taza de jugo.  3-4 tabletas de glucosa.  5-6 caramelos duros.  1 caja pequea de pasas de uva.   taza de gaseosa comn.  Mantenga una buena higiene para evitar infecciones.  No fume. SOLICITE ATENCIN MDICA SI:  Observa prdida vaginal con o sin picazn.  Se siente ms dbil o cansada que lo habitual.  [place].  Tiene un aumento de peso repentino, 2,5 kg o ms en una semana.  Pierde peso, 1.5 kg o ms en una semana.  Su nivel de glucosa en sangre es elevado, necesita instrucciones. SOLICITE ATENCIN MDICA DE INMEDIATO SI:  Sufre una cefalea  intensa.  Se marea o pierde el conocimiento  Presenta nuseas o vmitos.  Se siente desorientada confundida.  Sufre convulsiones.  Tiene problemas de visin.  Siente Physiological scientist.  Presenta una hemorragia vaginal abundante.  Tiene contracciones uterinas.  Tiene una prdida importante de lquido por la vagina DESPUS QUE NACE EL BEB:  Concurra a todos los controles de seguimiento y Clinical biochemist los anlisis de sangre segn las indicaciones de su mdico.  Mantenga un estilo de vida saludable para evitar la diabetes en el futuro. Aqu se incluye:  Siga el plan de alimentacin saludable.  Controle su peso.  Practique actividad fsica y descanse lo necesario.  No fume.  Amamante a su beb mientras pueda. Esto disminuir la probabilidad de que usted y su beb sufran diabetes posteriormente. Para ms informacin acerca de la diabetes, visite la pgina web de Holiday representative Diabetes Association: PMFashions.com.cy. Para ms informacin acerca de la diabetes gestacional cite la pgina web del [employer] Obst[employer]ians and Gynecologists en: RentRule.com.au. Document Released: 09/17/2005 Document Revised: 03/01/2012 [facility] Patient Information 2013 [place], [place].

## 2013-01-13 ENCOUNTER — Ambulatory Visit (INDEPENDENT_AMBULATORY_CARE_PROVIDER_SITE_OTHER): Payer: Self-pay | Admitting: *Deleted

## 2013-01-13 VITALS — BP 95/64 | Wt 155.6 lb

## 2013-01-13 DIAGNOSIS — O99810 Abnormal glucose complicating pregnancy: Secondary | ICD-10-CM

## 2013-01-13 NOTE — Progress Notes (Signed)
P = 91 

## 2013-01-17 ENCOUNTER — Ambulatory Visit (INDEPENDENT_AMBULATORY_CARE_PROVIDER_SITE_OTHER): Payer: Self-pay | Admitting: Obstetrics & Gynecology

## 2013-01-17 VITALS — BP 105/69 | Temp 97.6°F | Wt 152.6 lb

## 2013-01-17 DIAGNOSIS — O99810 Abnormal glucose complicating pregnancy: Secondary | ICD-10-CM

## 2013-01-17 DIAGNOSIS — O24410 Gestational diabetes mellitus in pregnancy, diet controlled: Secondary | ICD-10-CM

## 2013-01-17 LAB — POCT URINALYSIS DIP (DEVICE)
Ketones, ur: NEGATIVE mg/dL
Protein, ur: NEGATIVE mg/dL
Specific Gravity, Urine: 1.01 (ref 1.005–1.030)
pH: 6.5 (ref 5.0–8.0)

## 2013-01-17 MED ORDER — GLYBURIDE 5 MG PO TABS: 5.0000 mg | ORAL_TABLET | Freq: Every day | ORAL | Status: DC

## 2013-01-17 NOTE — Progress Notes (Signed)
Rare contractions, no problems. FBS 71-82, PP up to 282. Increase dose to 5 mg qam  NST reactive. Needs [place] for growth

## 2013-01-17 NOTE — Progress Notes (Signed)
U/S scheduled 01/20/13 at 2 pm.

## 2013-01-17 NOTE — Progress Notes (Signed)
Pulse: 71

## 2013-01-17 NOTE — Patient Instructions (Signed)
Diabetes mellitus gestacional (Gestational Diabetes Mellitus) La diabetes mellitus gestacional se produce slo durante el embarazo. Aparece cuando el organismo no puede controlar adecuadamente la glucosa (azcar) que aumenta en la sangre despus de comer. Durante el embarazo, se produce una resistencia a la insulina (sensibilidad reducida a la insulina) debido a la liberacin de hormonas por parte de la placenta. Generalmente, el pncreas de una mujer embarazada produce la cantidad suficiente de insulina para vencer esa resistencia. Sin embargo, en la diabetes gestacional, hay insulina pero no cumple su funcin adecuadamente. Si la resistencia es lo suficientemente grave como para que el pncreas no produzca la cantidad de insulina suficiente, la glucosa extra se acumula en la sangre.  QUINES TIENEN RIESGO DE DESARROLLAR DIABETES GESTACIONAL?  Las mujeres con historia de diabetes en la familia.  Las mujeres de ms de 25 aos.  Las que presentan sobrepeso.  Las mujeres que pertenecen a ciertos grupos tnicos (latinas, afroamericanas, norteamericanas nativas, asiticas y las originarias de las islas del Pac[employer]. QUE PUEDE OCURRIRLE AL BEB? Si el nivel de glucosa en sangre de la madre es demasiado elevado mientras este embarazada, el nivel extra de azcar pasar por el cordn umbilical hacia el beb. Algunos de los problemas del beb pueden ser:  Beb demasiado grande: si el nio recibe demasiada azcar, puede aumentar mucho de peso. Esto puede hacer que sea demasiado grande para nacer por parto normal (vaginal) por lo que ser necesario realizar una cesrea.  Bajo nivel de glucosa (hipoglucemia): el beb produce insulina extra en respuesta a la excesiva cantidad de azcar que obtiene de la madre. Cuando el beb nace y ya no necesita insulina extra, su nivel de azcar en sangre puede disminuir.  Ictericia (coloracin amarillenta de la piel y los ojos): esto es bastante frecuente en los bebs. La  causa es la acumulacin de una sustancia qumica denominada bilirrubina. No siempre es un trastorno grave, pero se observa con frecuencia en los bebs cuyas madres sufren diabetes gestacional. RIESGOS PARA LA MADRE Las mujeres que han sufrido diabetes gestacional pueden tener ms riesgos para algunos problemas como:  Preeclampsia o toxemia, incluyendo problemas con hipertensin arterial. La presin arterial y los niveles de protenas en la orina deben controlarse con frecuencia.  Infecciones  Parto por cesrea.  Aparicin de diabetes tipo 2 en una etapa posterior de la vida. Alrededor del 30% al 50% sufrir diabetes posteriormente, especialmente las que son obesas. DIAGNSTICO Las hormonas que causan resistencia a la insulina tienen su mayor nivel alrededor de las 24 a 28 semanas del embarazo. Si se experimentan sntomas, stos son similares a los sntomas que normalmente aparecen durante el embarazo.  La diabetes mellitus gestacional generalmente se diagnostica por medio de un mtodo en dos partes: 1. Despus de la 24 a 28 semanas de embarazo, la mujer debe beber una solucin que contiene glucosa y realizar un anlisis de sangre. Si el nivel de glucosa es elevado, la realizarn un segundo anlisis. 2. La prueba oral de tolerancia a la glucosa, que dura aproximadamente tres horas. Despus de realizar ayuno durante la noche, se controla nivel de glucosa en sangre. La mujer bebe una solucin que contiene glucosa y le realizan anlisis de glucosa en sangre cada hora. Si la mujer tiene factores de riesgos para la diabetes mellitus gestacional, el mdico podr indicar el anlisis antes de las 24 semanas de embarazo. TRATAMIENTO El tratamiento est dirigido a mantener la glucosa en sangre de la madre en un nivel normal y puede incluir:  La   planificacin de los alimentos.  Recibir insulina u otro medicamento para controlar el nivel de glucosa en sangre.  La prctica de ejercicios.  Llevar un  registro diario de los alimentos que consume.  Control y registro de los niveles de glucosa en sangre.  Control de los niveles de cetona en la orina, aunque esto ya no se considera necesario en la mayora de los embarazos. INSTRUCCIONES PARA EL CUIDADO DOMICILIARIO Mientras est embarazada:  Siga los consejos de su mdico relacionados con los controles prenatales, la planificacin de la comida, la actividad fsica, los medicamentos, vitaminas, los anlisis de sangre y otras pruebas y las actividades fsicas.  Lleve un registro de las comidas, las pruebas de glucosa en sangre y la cantidad de insulina que recibe (si corresponde). Muestre todo al profesional en cada consulta mdica prenatal.  Si sufre diabetes mellitus gestacional, podr tener problemas de hipoglucemia (nivel bajo de glucosa en sangre). Podr sospechar este problema si se siente repentinamente mareada, tiene temblores y/o se siente dbil. Si cree que esto le est ocurriendo, y tiene un medidor de glucosa, mida su nivel de glucosa en sangre. Siga los consejos de su mdico sobre el modo y el momento de tratar su nivel de glucosa en sangre. Generalmente se sigue la regla 15:15 Consuma 15 g de hidratos de carbono, espere 15 minutos y vuelva controlar el nivel de glucosa en sangre.. Ejemplos de 15 g de hidratos de carbono son:  1 taza de leche descremada.   taza de jugo.  3-4 tabletas de glucosa.  5-6 caramelos duros.  1 caja pequea de pasas de uva.   taza de gaseosa comn.  Mantenga una buena higiene para evitar infecciones.  No fume. SOLICITE ATENCIN MDICA SI:  Observa prdida vaginal con o sin picazn.  Se siente ms dbil o cansada que lo habitual.  Transpira mucho.  Tiene un aumento de peso repentino, 2,5 kg o ms en una semana.  Pierde peso, 1.5 kg o ms en una semana.  Su nivel de glucosa en sangre es elevado, necesita instrucciones. SOLICITE ATENCIN MDICA DE INMEDIATO SI:  Sufre una cefalea  intensa.  Se marea o pierde el conocimiento  Presenta nuseas o vmitos.  Se siente desorientada confundida.  Sufre convulsiones.  Tiene problemas de visin.  Siente dolor en el estmago.  Presenta una hemorragia vaginal abundante.  Tiene contracciones uterinas.  Tiene una prdida importante de lquido por la vagina DESPUS QUE NACE EL BEB:  Concurra a todos los controles de seguimiento y realice los anlisis de sangre segn las indicaciones de su mdico.  Mantenga un estilo de vida saludable para evitar la diabetes en el futuro. Aqu se incluye:  Siga el plan de alimentacin saludable.  Controle su peso.  Practique actividad fsica y descanse lo necesario.  No fume.  Amamante a su beb mientras pueda. Esto disminuir la probabilidad de que usted y su beb sufran diabetes posteriormente. Para ms informacin acerca de la diabetes, visite la pgina web de la American Diabetes Association: www.americandiabetesassociation.org. Para ms informacin acerca de la diabetes gestacional cite la pgina web del American Congress of Obstetricians and Gynecologists en: www.acog.org. Document Released: 09/17/2005 Document Revised: 03/01/2012 Exit[employer] Patient Information 2013 [employer].  

## 2013-01-20 ENCOUNTER — Ambulatory Visit (INDEPENDENT_AMBULATORY_CARE_PROVIDER_SITE_OTHER): Payer: Self-pay | Admitting: *Deleted

## 2013-01-20 ENCOUNTER — Ambulatory Visit (HOSPITAL_COMMUNITY): Payer: Self-pay

## 2013-01-20 VITALS — BP 104/67 | Wt 155.5 lb

## 2013-01-20 DIAGNOSIS — O99810 Abnormal glucose complicating pregnancy: Secondary | ICD-10-CM

## 2013-01-20 NOTE — Progress Notes (Signed)
P = 96  [place] scheduled for today was scheduled in error as she had nml AFI on 1/27 and was already scheduled for [place] growth on 01/24/13-order cancelled.  Pt voiced understanding.  Interpreter- Raquel

## 2013-01-24 ENCOUNTER — Ambulatory Visit (HOSPITAL_COMMUNITY)
Admission: RE | Admit: 2013-01-24 | Discharge: 2013-01-24 | Disposition: A | Discharge status: Home / Self Care | Payer: Self-pay | Source: Ambulatory Visit | Attending: Obstetrics & Gynecology | Admitting: Obstetrics & Gynecology

## 2013-01-24 ENCOUNTER — Other Ambulatory Visit: Payer: Self-pay | Admitting: Obstetrics & Gynecology

## 2013-01-24 ENCOUNTER — Encounter: Payer: Self-pay | Attending: Obstetrics & Gynecology | Admitting: Dietician

## 2013-01-24 ENCOUNTER — Ambulatory Visit (INDEPENDENT_AMBULATORY_CARE_PROVIDER_SITE_OTHER): Payer: Self-pay | Admitting: Obstetrics and Gynecology

## 2013-01-24 ENCOUNTER — Ambulatory Visit (HOSPITAL_COMMUNITY): Payer: Self-pay

## 2013-01-24 VITALS — BP 116/72 | Temp 96.3°F | Wt 155.3 lb

## 2013-01-24 DIAGNOSIS — O99810 Abnormal glucose complicating pregnancy: Secondary | ICD-10-CM

## 2013-01-24 DIAGNOSIS — O24419 Gestational diabetes mellitus in pregnancy, unspecified control: Secondary | ICD-10-CM

## 2013-01-24 DIAGNOSIS — Z713 Dietary counseling and surveillance: Secondary | ICD-10-CM | POA: Insufficient documentation

## 2013-01-24 DIAGNOSIS — O0990 Supervision of high risk pregnancy, unspecified, unspecified trimester: Secondary | ICD-10-CM

## 2013-01-24 LAB — POCT URINALYSIS DIP (DEVICE)
Ketones, ur: NEGATIVE mg/dL
Protein, ur: NEGATIVE mg/dL
Specific Gravity, Urine: 1.02 (ref 1.005–1.030)
pH: 6.5 (ref 5.0–8.0)

## 2013-01-24 NOTE — Progress Notes (Signed)
Diabetes Education:  Seen today for incidents of increased glucose in the evenings.  Notes that she was having some meal changes.  Was having an omlette with egg whites and green beans and used juice as her beverage.  Discussed the need to use fruit and not juice unless having a low blood glucose.  Encouraged her to have her snacks and to use fruit rather than the juice.  Recommended fruit with a protein as an afternoon snack.  Maggie Wilmina Maxham, RN, RD, CDE

## 2013-01-24 NOTE — Patient Instructions (Signed)
Mtodo para contar los movimientos fetales (Fetal Movement Counts) Nombre de la paciente: __________________________________________________ Fecha probable de parto:____________________ En los embarazos de alto riesgo se recomienda contar las pataditas, pero tambin es una buena idea que lo hagan todas las embarazadas. Comience a contarlas a las 28 semanas de embarazo. Los movimientos fetales aumentan luego de una comida completa o de comer o beber algo dulce (el nivel de azcar en la sangre est ms alto). Tambin es importante beber gran cantidad de lquidos (hidratarse bien) antes de contar. Si se recuesta sobre el lado izquierdo mejorar la circulacin, o puede sentarse en una silla cmoda con los brazos sobre el abdomen y sin distracciones que la rodeen. CONTANDO  Trate de contar a la misma hora todos los das que lo haga.  Marque el da y la hora y vea cunto le lleva sentir 10 movimientos (patadas, agitaciones, sacudones, vueltas). Debe sentir al menos 10 movimientos en 2 horas. Probablemente sienta los 10 movimientos en menos de dos horas. Si no los siente, espere una hora y cuente nuevamente. Luego de algunos das tendr un patrn.  Debemos observar si hay cambios en el patrn o no hay suficientes pataditas en 2 horas. Le lleva ms tiempo contar los 10 movimientos? SOLICITE ATENCIN MDICA SI:  Siente menos de 10 pataditas en 2 horas. Intntelo dos veces.  No siente movimientos durante 1 hora.  El patrn se modifica o le lleva ms tiempo cada da contar las 10 pataditas.  Siente que el beb no se mueve como lo hace habitualmente. Fecha: ____________ Movimientos: ____________ Comienzo hora: ____________ Fin hora: ____________ Fecha: ____________ Movimientos: ____________ Comienzo hora: ____________ Fin hora: ____________ Fecha: ____________ Movimientos: ____________ Comienzo hora: ____________ Fin hora: ____________ Fecha: ____________ Movimientos: ____________ Comienzo hora:  ____________ Fin hora: ____________ Fecha: ____________ Movimientos: ____________ Comienzo hora: ____________ Fin hora: ____________ Fecha: ____________ Movimientos: ____________ Comienzo hora: ____________ Fin hora: ____________ Fecha: ____________ Movimientos: ____________ Comienzo hora: ____________ Fin hora: ____________  Fecha: ____________ Movimientos: ____________ Comienzo hora: ____________ Fin hora: ____________ Fecha: ____________ Movimientos: ____________ Comienzo hora: ____________ Fin hora: ____________ Fecha: ____________ Movimientos: ____________ Comienzo hora: ____________ Fin hora: ____________ Fecha: ____________ Movimientos: ____________ Comienzo hora: ____________ Fin hora: ____________ Fecha: ____________ Movimientos: ____________ Comienzo hora: ____________ Fin hora: ____________ Fecha: ____________ Movimientos: ____________ Comienzo hora: ____________ Fin hora: ____________ Fecha: ____________ Movimientos: ____________ Comienzo hora: ____________ Fin hora: ____________  Fecha: ____________ Movimientos: ____________ Comienzo hora: ____________ Fin hora: ____________ Fecha: ____________ Movimientos: ____________ Comienzo hora: ____________ Fin hora: ____________ Fecha: ____________ Movimientos: ____________ Comienzo hora: ____________ Fin hora: ____________ Fecha: ____________ Movimientos: ____________ Comienzo hora: ____________ Fin hora: ____________ Fecha: ____________ Movimientos: ____________ Comienzo hora: ____________ Fin hora: ____________ Fecha: ____________ Movimientos: ____________ Comienzo hora: ____________ Fin hora: ____________ Fecha: ____________ Movimientos: ____________ Comienzo hora: ____________ Fin hora: ____________  Fecha: ____________ Movimientos: ____________ Comienzo hora: ____________ Fin hora: ____________ Fecha: ____________ Movimientos: ____________ Comienzo hora: ____________ Fin hora: ____________ Fecha: ____________ Movimientos: ____________  Comienzo hora: ____________ Fin hora: ____________ Fecha: ____________ Movimientos: ____________ Comienzo hora: ____________ Fin hora: ____________ Fecha: ____________ Movimientos: ____________ Comienzo hora: ____________ Fin hora: ____________ Fecha: ____________ Movimientos: ____________ Comienzo hora: ____________ Fin hora: ____________ Fecha: ____________ Movimientos: ____________ Comienzo hora: ____________ Fin hora: ____________  Fecha: ____________ Movimientos: ____________ Comienzo hora: ____________ Fin hora: ____________ Fecha: ____________ Movimientos: ____________ Comienzo hora: ____________ Fin hora: ____________ Fecha: ____________ Movimientos: ____________ Comienzo hora: ____________ Fin hora: ____________ Fecha: ____________ Movimientos: ____________ Comienzo hora: ____________ Fin hora: ____________ Fecha: ____________ Movimientos: ____________ Comienzo hora: ____________ Fin hora: ____________   Fecha: ____________ Movimientos: ____________ Comienzo hora: ____________ Fin hora: ____________ Fecha: ____________ Movimientos: ____________ Comienzo hora: ____________ Fin hora: ____________  Fecha: ____________ Movimientos: ____________ Comienzo hora: ____________ Fin hora: ____________ Fecha: ____________ Movimientos: ____________ Comienzo hora: ____________ Fin hora: ____________ Fecha: ____________ Movimientos: ____________ Comienzo hora: ____________ Fin hora: ____________ Fecha: ____________ Movimientos: ____________ Comienzo hora: ____________ Fin hora: ____________ Fecha: ____________ Movimientos: ____________ Comienzo hora: ____________ Fin hora: ____________ Fecha: ____________ Movimientos: ____________ Comienzo hora: ____________ Fin hora: ____________ Fecha: ____________ Movimientos: ____________ Comienzo hora: ____________ Fin hora: ____________  Fecha: ____________ Movimientos: ____________ Comienzo hora: ____________ Fin hora: ____________ Fecha: ____________ Movimientos:  ____________ Comienzo hora: ____________ Fin hora: ____________ Fecha: ____________ Movimientos: ____________ Comienzo hora: ____________ Fin hora: ____________ Fecha: ____________ Movimientos: ____________ Comienzo hora: ____________ Fin hora: ____________ Fecha: ____________ Movimientos: ____________ Comienzo hora: ____________ Fin hora: ____________ Fecha: ____________ Movimientos: ____________ Comienzo hora: ____________ Fin hora: ____________ Fecha: ____________ Movimientos: ____________ Comienzo hora: ____________ Fin hora: ____________  Fecha: ____________ Movimientos: ____________ Comienzo hora: ____________ Fin hora: ____________ Fecha: ____________ Movimientos: ____________ Comienzo hora: ____________ Fin hora: ____________ Fecha: ____________ Movimientos: ____________ Comienzo hora: ____________ Fin hora: ____________ Fecha: ____________ Movimientos: ____________ Comienzo hora: ____________ Fin hora: ____________ Fecha: ____________ Movimientos: ____________ Comienzo hora: ____________ Fin hora: ____________ Fecha: ____________ Movimientos: ____________ Comienzo hora: ____________ Fin hora: ____________ Fecha: ____________ Movimientos: ____________ Comienzo hora: ____________ Fin hora: ____________  Document Released: 03/16/2008 Document Revised: 03/01/2012 ExitCare Patient Information 2013 [employer], [employer].  

## 2013-01-24 NOTE — Progress Notes (Signed)
Pulse- 92  Edema-hands/feet  Pressure-pelvic Pt c/o feeling faint x 2 last week.  Contractions started x 2

## 2013-01-24 NOTE — Progress Notes (Signed)
Had [place] today (result pending). Has increased glyburide to 5mg  q am. Fastings 70's-90 but some PP lunch and dinner elevations 180-221> see Maggie. Discussed presyncope sx. And to check CBG if recurs. NST reactive. [place] result pending

## 2013-01-27 ENCOUNTER — Ambulatory Visit (INDEPENDENT_AMBULATORY_CARE_PROVIDER_SITE_OTHER): Payer: Self-pay | Admitting: General Practice

## 2013-01-27 VITALS — BP 95/59 | Wt 158.0 lb

## 2013-01-27 DIAGNOSIS — O99810 Abnormal glucose complicating pregnancy: Secondary | ICD-10-CM

## 2013-01-27 NOTE — Progress Notes (Signed)
Pulse- 67 Patient reports painful contractions yesterday but stated they eventually went away. Reviewed labor precautions with patient

## 2013-01-27 NOTE — Progress Notes (Signed)
NST 01/27/13 reactive 

## 2013-01-31 ENCOUNTER — Other Ambulatory Visit: Payer: Self-pay

## 2013-01-31 ENCOUNTER — Ambulatory Visit (INDEPENDENT_AMBULATORY_CARE_PROVIDER_SITE_OTHER): Payer: Self-pay | Admitting: Family Medicine

## 2013-01-31 VITALS — BP 115/76 | Temp 98.0°F | Wt 157.2 lb

## 2013-01-31 DIAGNOSIS — O24410 Gestational diabetes mellitus in pregnancy, diet controlled: Secondary | ICD-10-CM

## 2013-01-31 DIAGNOSIS — O99810 Abnormal glucose complicating pregnancy: Secondary | ICD-10-CM

## 2013-01-31 DIAGNOSIS — O0990 Supervision of high risk pregnancy, unspecified, unspecified trimester: Secondary | ICD-10-CM

## 2013-01-31 DIAGNOSIS — O24419 Gestational diabetes mellitus in pregnancy, unspecified control: Secondary | ICD-10-CM

## 2013-01-31 MED ORDER — GLYBURIDE 5 MG PO TABS: 10.0000 mg | ORAL_TABLET | Freq: Every day | ORAL | Status: DC

## 2013-01-31 NOTE — Progress Notes (Signed)
Pulse 88 Pt needs a refill on her PNV.  Has pelvic pain and pressure.

## 2013-01-31 NOTE — Progress Notes (Signed)
Pt reports that all of her children and husband have been vomiting x2 days. She is nauseous today. Discussed measures to maintain hydration if she becomes sick also. Pt instructed not to take her glyburide if she is not able to eat. Alex- interpreter.  IOL scheduled 2/25 @ 0730

## 2013-01-31 NOTE — Progress Notes (Signed)
NST reviewed and reactive. EFW 3510 gms, AC ahead, nml fluid FBS 67-84 2 hr pp 64-226 (12/16 out of range at dinner and lunch)---Increased her Glyburide once her stomach is feeling better. Sched. IOL on 2/25

## 2013-01-31 NOTE — Patient Instructions (Addendum)
Infeccin por norovirus (Norovirus Infection) La enfermedad por norovirus es causada por una infeccin viral. El trmino norovirus refiere a un grupo de virus. Cualquiera de esos virus puede causar la enfermedad de norovirus. Esta enfermedad es a menudo conocida con otros nombres como gastroenteritis viral, gripe estomacal, e intoxicacin alimentaria. Cualquier persona puede tener una infeccin por norovirus. Las Transport planner la enfermedad varias veces durante su vida. CAUSAS El norovirus se encuentra en la materia fecal o el vmito de personas infectadas. Se transmite fcilmente de persona a persona (contagiosa). Las Dealer con norovirus son contagiosas desde el momento en el que comienzan a sentirse mal. Pueden seguir siendo contagiosas de 3 das a 2 semanas luego de la recuperacin. Las personas pueden infectarse con el virus de varias formas. Esto incluye:  Consumir alimentos o beber lquidos que estn contaminados con norovirus.  Tocar superficies u objetos contaminados con norovirus, y [employer] mano a la boca.  Tener contacto directo con una persona que est infectada y presenta sntomas. Esto puede ocurrir al cuidar de una persona enferma o compartir alimentos o cubiertos con alguien que est enfermo. SNTOMAS Los sntomas normalmente comienzan de 1 a 2 [address]  [address] luego de la ingestin del virus. Los sntomas pueden incluir:  Nuseas.  Vmitos.  Diarrea.  Clicos estomacales.  Fiebre no muy elevada.  Escalofros.  Dolor de [place].  Dolores musculares.  Cansancio. La mayor parte de las personas con norovirus mejoran luego de 1 a 2 das. Algunas personas sufren deshidratacin porque no pueden beber la suficiente cantidad de lquidos para reemplazar lo que perdieron por los vmitos y Technical sales engineer. Esto es especialmente cierto en nios pequeos, las personas [place], y otras personas que no pueden cuidarse por s [employer]. DIAGNSTICO El diagnstico se basa en los  sntomas y en el examen fsico. Actualmente, slo los laboratorios de salud pblica estatales tienen la posibilidad de Development worker, community el norovirus en la materia fecal o el vmito. TRATAMIENTO No existe un tratamiento especfico para las infecciones de norovirus. No hay vacunas disponibles para prevenir estas infecciones. La enfermedad por norovirus normalmente es breve en personas sanas. Si usted est enfermo con vmitos y [place], debe beber suficiente agua y lquidos para Pharmacologist la orina de tono claro o color amarillo plido. La deshidratacin es el efecto ms grave que puede resultar de esta infeccin. Se puede reducir la probabilidad de deshidratarse bebiendo solucin de rehidratacin oral (SRO). Existen muchas SRO disponibles comercialmente, preparadas y en polvo, diseadas para rehidratar con seguridad. Es posible que el profesional se las recomiende.Reponga toda nueva prdida de lquidos ocasionada por diarrea o vmitos con SRO del siguiente modo:   Si el nio pesa 10 kg o menos (22 libras o menos), ofrzcale 60 a 120 ml ( a  taza o 2 a 4 onzas) de SRO en cada episodio de deposicin diarreica o vmito.  Si el nio pesa ms de 10 kg (ms de 22 libras), ofrzcale 120 a 240 ml ( taza a 1 taza o 4 a 8 onzas) de SRO en cada episodio de vmito o diarrea. INSTRUCCIONES PARA EL CUIDADO DOMICILIARIO  Siga todas las indicaciones del profesional.  Evite las bebidas sin azcar y alcohlicas mientras est enfermo.  Solo tome medicamentos de venta libre o recetados para Chief Technology Officer, el vmito, la diarrea, o la [place], segn las indicaciones del profesional. Puede reducir las probabilidades de Forensic psychologist con norovirus o diseminarlo siguiendo los siguientes pasos:  Lave sus manos con frecuencia, especialmente luego de Fish farm manager  bao, cambiar paales, y antes de preparar o ingerir alimentos.  Lave las frutas y vegetales con cuidado. Cocine los mariscos antes de comerlos.  No prepare alimentos para  otros mientras est infectado y [place] al menos 3 das luego de recuperarse de la enfermedad.  Lave cuidadosamente y desinfecte las superficies contaminadas inmediatamente luego de un episodio de enfermedad utilizando un limpiador hogareo con blanqueador.  Qutese y lave la ropa o prendas que puedan estar contaminadas con el virus.  Utilice el inodoro para eliminar vmito o materia fecal. Asegrese de que la zona que lo rodea est limpia.  Los alimentos que puedan haber sido contaminados por una persona enferma deben ser eliminados. SOLICITE ATENCIN MDICA DE INMEDIATO SI:  Desarrolla sntomas de deshidratacin que no mejoran con el reemplazo de lquidos. Pueden incluir:  Somnolencia excesiva.  Ausencia de lgrimas.  [employer].  Mareos al pararse.  Pulso dbil. Document Released: 01/10/2011 Document Revised: 03/01/2012 [facility] Patient Information 2013 [place], [place].  Lactancia materna  (Breastfeeding) Decidir Museum/gallery exhibitions officer es una de las mejores elecciones que puede hacer por usted y su beb. La informacin que se brinda a Psychologist, clinical dar una breve visin de los beneficios de la lactancia materna as como de las dudas ms frecuentes alrededor de ella.  LOS BENEFICIOS DE AMAMANTAR  Para el beb   La primera leche (calostro ) ayuda al mejor funcionamiento del sistema digestivo del beb.   La leche tiene anticuerpos que provienen de la madre y que ayudan a prevenir las infecciones en el beb.   El beb tiene una menor incidencia de asma, alergias y del sndrome de muerte sbita del lactante (SMSL).   Los nutrientes en la [place] materna son mejores para el beb que los preparados para lactantes y la [place] materna ayuda a un mejor desarrollo del cerebro del beb.   Los bebs amamantados tienen menos gases, clicos y estreimiento.  Para la mam   La lactancia materna favorece el desarrollo de un vnculo muy especial entre la madre y el beb.   Es ms conveniente,  siempre disponible y a Landscape architect y Film/video editor.   Consume caloras en la madre y la ayuda a perder el peso ganado durante el [place].   Favorece la contraccin del tero a su tamao normal, de manera ms rpida y [employer] las hemorragias luego del [place].   Las [employer] que amamantan tienen menor riesgo de Geophysical data processor de mama.  FRECUENCIA DEL AMAMANTAMIENTO   Un beb sano, nacido a trmino, puede amamantarse con tanta frecuencia como cada hora, o espaciar las comidas cada tres horas.   Observe al beb cuando manifieste signos de hambre. Amamante a su beb si muestra signos de hambre. Esta frecuencia variar de un beb a otro.   Amamntelo tan seguido como el beb lo solicite, o cuando usted sienta la necesidad de Paramedic sus [place].   Despierte al beb si han pasado 3  4 horas desde la ltima comida.   El amamantamiento frecuente la ayudar a producir ms [place] y a Education officer, community de Engineer, mining en los pezones e hinchazn de las [place].  POSICIN DEL BEBE PARA EL AMAMANTAMIENTO   Ya sea que se encuentre [place] o sentada, asegrese que el abdomen del beb enfrente el suyo.   Sostenga la mama con el pulgar por arriba y los otros 4 dedos por debajo. Asegrese que sus dedos se encuentren lejos del pezn y de la boca del beb.   Empuje suavemente los labios del beb con el  pezn o con el dedo.   Cuando la boca del beb se abra lo suficiente, introduzca el pezn y la areola tanto como le sea posible dentro de la boca.   Coloque al beb cerca suyo de modo que su nariz y mejillas toquen las mamas al [employer].  ALIMENTACIN Y SUCCIN   La duracin de cada comida vara de un beb a otro y de Neomia Dear comida a [place].   El beb debe succionar entre 2 y 3 minutos para que Development worker, international aid. Esto se denomina "bajada". Por este motivo, permita que el nio se alimente en cada mama todo lo que desee. Terminar de mamar cuando haya recibido la cantidad [place] de nutrientes.    Para detener la succin coloque su dedo en la comisura de la boca del nio y Midwife entre sus encas antes de quitarle la mama de la boca. Esto la ayudar a English as a second language teacher.  COMO SABER SI EL BEB OBTIENE LA SUFICIENTE LECHE MATERNA  Preguntarse si el beb obtiene la cantidad suficiente de [place] es una preocupacin frecuente [employer]. Puede asegurarse que el beb tiene la leche suficiente si:   El beb succiona activamente y usted escucha que traga .   El beb parece estar relajado y satisfecho despus de Psychologist, clinical.   El nio se alimenta al menos 8 a 12 veces en 24 horas. Alimntelo hasta que se desprenda por sus propios medios o se quede dormido en la primera mama (al menos durante 10 a 20 minutos), luego ofrzcale el otro lado.   El beb moja 5 a 6 paales desechables (6 a 8 paales de tela) en 24 horas cuando tiene 5  6 das de vida.   Tiene al [employer] 3 a 4 deposiciones todos los [employer] primeros meses. La materia fecal debe ser blanda y [place].   El beb debe aumentar 4 a 6 libras (120 a 170 gr.) por semana despus de los 4 [address] de vida.   Siente que las mamas se ablandan despus de amamantar  REDUCIR LA CONGESTIN DE LAS MAMAS   Durante la primera semana despus del [place], usted puede experimentar hinchazn en las mamas. Cuando las mamas estn congestionadas, se sienten calientes, llenas y molestas al tacto. Puede reducir la congestin si:   Lo amamanta frecuentemente, cada 2-3 horas. Las mams que [employer] pronto y con frecuencia tienen menos problemas de [place].   Coloque compresas de hielo en sus mamas durante 10-20 minutos entre cada amamantamiento. Esto ayuda a Building services engineer. Envuelva las bolsas de hielo en una toalla liviana para proteger su piel. Las bolsas de vegetales congelados funcionan bien para este propsito.   Tome una ducha tibia o aplique compresas hmedas calientes en las mamas durante 5 a 10 minutos antes de  cada vez que [place]. Esto aumenta la circulacin y [place] a que la [place].   Masajee suavemente la mama antes y Psychologist, sport and exercise. Con las puntas de los dedos, masajee desde la pared torcica hacia abajo hasta llegar al pezn, con movimientos circulares.   Asegrese que el nio vaca al menos una mama antes de cambiar de lado.   Use un sacaleche para vaciar la mama si el beb se duerme o no se alimenta bien. Tambin podr [employer] con esa bomba si tiene que volver al trabajo o siente que las mamas estn congestionadas.   Evite los biberones, chupetes o complementar la alimentacin con agua o jugos en lugar de  la [employer]. La leche materna es todo el alimento que el beb necesita. No es necesario que el nio ingiera agua o preparados de bibern. Louann Liv, es lo mejor para ayudar a que las mamas produzcan ms [place]. no darle suplementos al [employer] las primeras semanas.   Verifique que el beb se encuentra en la posicin correcta mientras lo alimenta.   Use un sostn que soporte bien sus mamas y [employer] que tienen aro.   Consuma una dieta balanceada y beba lquidos en cantidad.   Descanse con frecuencia, reljese y tome sus vitaminas prenatales para evitar la fatiga, el estrs y la anemia.  Si sigue estas indicaciones, la congestin debe mejorar en 24 a 48 horas. Si an tiene dificultades, consulte a Barista.  CUDESE USTED MISMA  Cuide sus mamas.   Bese o dchese diariamente.   Evite usar [employer].   Comience a amamantar del lado izquierdo en una comida y del lado derecho en la siguiente.   Notar que aumenta el flujo de [place] a los 2 a 5 [address] despus del [address]. Puede sentir algunas molestias por la congestin, lo que hace que sus mamas estn duras y sensibles. La congestin disminuye en 24 a 48 horas. Mientras tanto, aplique toallas hmedas calientes durante 5 a 10 minutos antes de amamantar. Un masaje suave y la  extraccin de un poco de leche antes de Museum/gallery exhibitions officer ablandarn las mamas y har ms fcil que el beb se agarre.   Use un buen sostn y seque al aire los pezones durante 3 a 4 minutos luego de Wellsite geologist.   Solo utilice apsitos de algodn.   Utilice lanolina [employer] pezones luego de [place]. No necesita lavarlos luego de alimentar al [employer]. Otra opcin es exprimir algunas gotas de [place] y [employer] pezones.  Cumpla con estos cuidados   Consuma alimentos bien balanceados y refrigerios nutritivos.   Dixie Dials, jugos de fruta y agua para Warehouse manager sed (alrededor de 8 vasos por Futures trader).   Descanse lo suficiente.  Evite los alimentos que usted note que pueden afectar al beb.  SOLICITE ATENCIN MDICA SI:   Tiene dificultad con la lactancia materna y [place].   Tiene una zona de color rojo, dura y dolorosa en la mama que se acompaa de [place].   El beb est muy somnoliento como para alimentarse bien o tiene problemas para dormir.   Su beb moja menos de 6 paales al [address], a los 5 [address]  [address] de vida.   La piel del beb o la parte blanca de sus ojos est ms amarilla de lo que estaba en el hospital.   Se siente deprimida.  Document Released: 12/08/2005 Document Revised: 06/08/2012 [facility] Patient Information 2013 [place], [place].

## 2013-02-03 ENCOUNTER — Encounter (HOSPITAL_COMMUNITY): Payer: Self-pay | Admitting: *Deleted

## 2013-02-03 ENCOUNTER — Telehealth (HOSPITAL_COMMUNITY): Payer: Self-pay | Admitting: *Deleted

## 2013-02-03 ENCOUNTER — Other Ambulatory Visit: Payer: Self-pay

## 2013-02-03 NOTE — Telephone Encounter (Signed)
Preadmission screen Interpreter number [phone]

## 2013-02-03 NOTE — Telephone Encounter (Signed)
Preadmission screen  

## 2013-02-04 ENCOUNTER — Encounter (HOSPITAL_COMMUNITY): Payer: Self-pay | Admitting: *Deleted

## 2013-02-04 ENCOUNTER — Inpatient Hospital Stay (HOSPITAL_COMMUNITY)
Admission: AD | Admit: 2013-02-04 | Discharge: 2013-02-04 | Disposition: A | Discharge status: Home / Self Care | Payer: Self-pay | Source: Ambulatory Visit | Attending: Obstetrics & Gynecology | Admitting: Obstetrics & Gynecology

## 2013-02-04 ENCOUNTER — Other Ambulatory Visit: Payer: Self-pay

## 2013-02-04 DIAGNOSIS — O99810 Abnormal glucose complicating pregnancy: Secondary | ICD-10-CM | POA: Insufficient documentation

## 2013-02-04 DIAGNOSIS — Z3689 Encounter for other specified antenatal screening: Secondary | ICD-10-CM

## 2013-02-04 NOTE — MAU Provider Note (Signed)
Attestation of Attending Supervision of Advanced Practitioner (PA/CNM/NP): Evaluation and management procedures were performed by the Advanced Practitioner under my supervision and collaboration.  I have reviewed the Advanced Practitioner's note and chart, and I agree with the management and plan.  Raphel Stickles, MD, FACOG Attending Obstetrician & Gynecologist Faculty Practice, [facility]  

## 2013-02-04 NOTE — MAU Note (Signed)
Pt here for NST, clinic is closed today.

## 2013-02-04 NOTE — MAU Provider Note (Signed)
Here for NST due to Gestational Diabetes  NST is reactive No contractions  Followup in clinic

## 2013-02-07 ENCOUNTER — Other Ambulatory Visit: Payer: Self-pay | Admitting: Obstetrics & Gynecology

## 2013-02-07 ENCOUNTER — Ambulatory Visit (INDEPENDENT_AMBULATORY_CARE_PROVIDER_SITE_OTHER): Payer: Self-pay | Admitting: Obstetrics & Gynecology

## 2013-02-07 VITALS — BP 102/66 | Wt 158.9 lb

## 2013-02-07 DIAGNOSIS — O24919 Unspecified diabetes mellitus in pregnancy, unspecified trimester: Secondary | ICD-10-CM

## 2013-02-07 DIAGNOSIS — O99810 Abnormal glucose complicating pregnancy: Secondary | ICD-10-CM

## 2013-02-07 NOTE — Patient Instructions (Signed)
Return to clinic for any obstetric concerns or go to MAU for evaluation  

## 2013-02-07 NOTE — Progress Notes (Signed)
Blood sugars within good range, continue Glyburide.   01/24/13 growth scan was 3510g/ >90%.  NST performed today was reviewed and was found to be reactive.  Continue recommended antenatal testing and prenatal care.  Complains of left back tooth pain, no erythema observed in area. Recommended Oragel and Sensodyne toothpaste, go to ER if symptoms worsen. No other complaints or concerns.  Fetal movement and labor precautions reviewed.

## 2013-02-07 NOTE — Progress Notes (Signed)
Pulse: 76 Pt reports baby is moving less, she reports a brownish discharge. Has seen some blood when wiping.

## 2013-02-07 NOTE — Progress Notes (Signed)
NST 01-10-13 reactive 

## 2013-02-08 LAB — POCT URINALYSIS DIP (DEVICE)
Ketones, ur: NEGATIVE mg/dL
Protein, ur: NEGATIVE mg/dL
Specific Gravity, Urine: 1.02 (ref 1.005–1.030)
pH: 7 (ref 5.0–8.0)

## 2013-02-11 ENCOUNTER — Other Ambulatory Visit: Payer: Self-pay

## 2013-02-15 ENCOUNTER — Encounter (HOSPITAL_COMMUNITY): Payer: Self-pay

## 2013-02-15 ENCOUNTER — Encounter (HOSPITAL_COMMUNITY): Payer: Self-pay | Admitting: Anesthesiology

## 2013-02-15 ENCOUNTER — Inpatient Hospital Stay (HOSPITAL_COMMUNITY)
Admission: RE | Admit: 2013-02-15 | Discharge: 2013-02-17 | DRG: 775 | Disposition: A | Discharge status: Home / Self Care | Payer: Medicaid Other | Source: Ambulatory Visit | Attending: Family Medicine | Admitting: Family Medicine

## 2013-02-15 ENCOUNTER — Inpatient Hospital Stay (HOSPITAL_COMMUNITY): Payer: Medicaid Other | Admitting: Anesthesiology

## 2013-02-15 VITALS — BP 98/59 | HR 70 | Temp 97.7°F | Resp 20 | Ht 62.0 in | Wt 156.0 lb

## 2013-02-15 DIAGNOSIS — O99814 Abnormal glucose complicating childbirth: Secondary | ICD-10-CM

## 2013-02-15 DIAGNOSIS — O24919 Unspecified diabetes mellitus in pregnancy, unspecified trimester: Secondary | ICD-10-CM

## 2013-02-15 DIAGNOSIS — K602 Anal fissure, unspecified: Secondary | ICD-10-CM

## 2013-02-15 LAB — CBC
HCT: 30.8 % — ABNORMAL LOW (ref 36.0–46.0)
Hemoglobin: 9.4 g/dL — ABNORMAL LOW (ref 12.0–15.0)
MCH: 20.5 pg — ABNORMAL LOW (ref 26.0–34.0)
MCV: 67.1 fL — ABNORMAL LOW (ref 78.0–100.0)
RBC: 4.59 MIL/uL (ref 3.87–5.11)

## 2013-02-15 LAB — RPR: RPR Ser Ql: NONREACTIVE

## 2013-02-15 LAB — GLUCOSE, CAPILLARY: Glucose-Capillary: 68 mg/dL — ABNORMAL LOW (ref 70–99)

## 2013-02-15 LAB — TYPE AND SCREEN: ABO/RH(D): A POS

## 2013-02-15 LAB — GLUCOSE, RANDOM: Glucose, Bld: 69 mg/dL — ABNORMAL LOW (ref 70–99)

## 2013-02-15 MED ORDER — TERBUTALINE SULFATE 1 MG/ML IJ SOLN: 0.2500 mg | Freq: Once | INTRAMUSCULAR | Status: DC | PRN

## 2013-02-15 MED ORDER — LIDOCAINE HCL (PF) 1 % IJ SOLN
30.0000 mL | INTRAMUSCULAR | Status: DC | PRN
  Filled 2013-02-15: qty 30

## 2013-02-15 MED ORDER — EPHEDRINE 5 MG/ML INJ: 10.0000 mg | INTRAVENOUS | Status: DC | PRN

## 2013-02-15 MED ORDER — FLEET ENEMA 7-19 GM/118ML RE ENEM: 1.0000 | ENEMA | Freq: Every day | RECTAL | Status: DC | PRN

## 2013-02-15 MED ORDER — SIMETHICONE 80 MG PO CHEW
80.0000 mg | CHEWABLE_TABLET | ORAL | Status: DC | PRN
  Administered 2013-02-17: 80 mg via ORAL

## 2013-02-15 MED ORDER — NALBUPHINE SYRINGE 5 MG/0.5 ML: 10.0000 mg | INJECTION | INTRAMUSCULAR | Status: DC | PRN

## 2013-02-15 MED ORDER — ONDANSETRON HCL 4 MG/2ML IJ SOLN: 4.0000 mg | INTRAMUSCULAR | Status: DC | PRN

## 2013-02-15 MED ORDER — ONDANSETRON HCL 4 MG PO TABS: 4.0000 mg | ORAL_TABLET | ORAL | Status: DC | PRN

## 2013-02-15 MED ORDER — DIBUCAINE 1 % RE OINT: 1.0000 "application " | TOPICAL_OINTMENT | RECTAL | Status: DC | PRN

## 2013-02-15 MED ORDER — SENNOSIDES-DOCUSATE SODIUM 8.6-50 MG PO TABS
2.0000 | ORAL_TABLET | Freq: Every day | ORAL | Status: DC
  Administered 2013-02-15 – 2013-02-16 (×2): 2 via ORAL

## 2013-02-15 MED ORDER — FLEET ENEMA 7-19 GM/118ML RE ENEM: 1.0000 | ENEMA | RECTAL | Status: DC | PRN

## 2013-02-15 MED ORDER — CITRIC ACID-SODIUM CITRATE 334-500 MG/5ML PO SOLN: 30.0000 mL | ORAL | Status: DC | PRN

## 2013-02-15 MED ORDER — IBUPROFEN 600 MG PO TABS: 600.0000 mg | ORAL_TABLET | Freq: Four times a day (QID) | ORAL | Status: DC | PRN

## 2013-02-15 MED ORDER — OXYTOCIN 40 UNITS IN LACTATED RINGERS INFUSION - SIMPLE MED: 62.5000 mL/h | INTRAVENOUS | Status: DC | PRN

## 2013-02-15 MED ORDER — FENTANYL 2.5 MCG/ML BUPIVACAINE 1/10 % EPIDURAL INFUSION (WH - ANES)
INTRAMUSCULAR | Status: DC | PRN
  Administered 2013-02-15: 14 mL/h via EPIDURAL

## 2013-02-15 MED ORDER — WITCH HAZEL-GLYCERIN EX PADS: 1.0000 "application " | MEDICATED_PAD | CUTANEOUS | Status: DC | PRN

## 2013-02-15 MED ORDER — OXYTOCIN 40 UNITS IN LACTATED RINGERS INFUSION - SIMPLE MED
1.0000 m[IU]/min | INTRAVENOUS | Status: DC
  Administered 2013-02-15: 2 m[IU]/min via INTRAVENOUS
  Filled 2013-02-15: qty 1000

## 2013-02-15 MED ORDER — LANOLIN HYDROUS EX OINT: TOPICAL_OINTMENT | CUTANEOUS | Status: DC | PRN

## 2013-02-15 MED ORDER — PRENATAL MULTIVITAMIN CH
1.0000 | ORAL_TABLET | Freq: Every day | ORAL | Status: DC
  Administered 2013-02-16 – 2013-02-17 (×2): 1 via ORAL
  Filled 2013-02-15 (×2): qty 1

## 2013-02-15 MED ORDER — PHENYLEPHRINE 40 MCG/ML (10ML) SYRINGE FOR IV PUSH (FOR BLOOD PRESSURE SUPPORT): 80.0000 ug | PREFILLED_SYRINGE | INTRAVENOUS | Status: DC | PRN

## 2013-02-15 MED ORDER — OXYTOCIN 40 UNITS IN LACTATED RINGERS INFUSION - SIMPLE MED
62.5000 mL/h | INTRAVENOUS | Status: DC
  Administered 2013-02-15: 62.5 mL/h via INTRAVENOUS

## 2013-02-15 MED ORDER — ONDANSETRON HCL 4 MG/2ML IJ SOLN: 4.0000 mg | Freq: Four times a day (QID) | INTRAMUSCULAR | Status: DC | PRN

## 2013-02-15 MED ORDER — OXYTOCIN BOLUS FROM INFUSION
500.0000 mL | INTRAVENOUS | Status: DC
  Administered 2013-02-15: 500 mL via INTRAVENOUS

## 2013-02-15 MED ORDER — IBUPROFEN 600 MG PO TABS
600.0000 mg | ORAL_TABLET | Freq: Four times a day (QID) | ORAL | Status: DC
  Administered 2013-02-15 – 2013-02-17 (×7): 600 mg via ORAL
  Filled 2013-02-15 (×7): qty 1

## 2013-02-15 MED ORDER — ZOLPIDEM TARTRATE 5 MG PO TABS: 5.0000 mg | ORAL_TABLET | Freq: Every evening | ORAL | Status: DC | PRN

## 2013-02-15 MED ORDER — FENTANYL 2.5 MCG/ML BUPIVACAINE 1/10 % EPIDURAL INFUSION (WH - ANES)
14.0000 mL/h | INTRAMUSCULAR | Status: DC
Start: 2013-02-15 — End: 2013-02-15
  Filled 2013-02-15: qty 125

## 2013-02-15 MED ORDER — LACTATED RINGERS IV SOLN
INTRAVENOUS | Status: DC
  Administered 2013-02-15 (×2): via INTRAVENOUS

## 2013-02-15 MED ORDER — TETANUS-DIPHTH-ACELL PERTUSSIS 5-2.5-18.5 LF-MCG/0.5 IM SUSP: 0.5000 mL | Freq: Once | INTRAMUSCULAR | Status: DC

## 2013-02-15 MED ORDER — LACTATED RINGERS IV SOLN: 500.0000 mL | Freq: Once | INTRAVENOUS | Status: DC

## 2013-02-15 MED ORDER — DIPHENHYDRAMINE HCL 50 MG/ML IJ SOLN: 12.5000 mg | INTRAMUSCULAR | Status: DC | PRN

## 2013-02-15 MED ORDER — OXYCODONE HCL 5 MG PO TABS: 5.0000 mg | ORAL_TABLET | ORAL | Status: DC | PRN

## 2013-02-15 MED ORDER — BENZOCAINE-MENTHOL 20-0.5 % EX AERO
1.0000 "application " | INHALATION_SPRAY | CUTANEOUS | Status: DC | PRN
  Filled 2013-02-15: qty 56

## 2013-02-15 MED ORDER — FERROUS SULFATE 325 (65 FE) MG PO TABS
325.0000 mg | ORAL_TABLET | Freq: Two times a day (BID) | ORAL | Status: DC
  Administered 2013-02-16 – 2013-02-17 (×3): 325 mg via ORAL
  Filled 2013-02-15 (×4): qty 1

## 2013-02-15 MED ORDER — BISACODYL 10 MG RE SUPP
10.0000 mg | Freq: Every day | RECTAL | Status: DC | PRN
  Filled 2013-02-15: qty 1

## 2013-02-15 MED ORDER — EPHEDRINE 5 MG/ML INJ
10.0000 mg | INTRAVENOUS | Status: DC | PRN
  Filled 2013-02-15: qty 4

## 2013-02-15 MED ORDER — PHENYLEPHRINE 40 MCG/ML (10ML) SYRINGE FOR IV PUSH (FOR BLOOD PRESSURE SUPPORT)
80.0000 ug | PREFILLED_SYRINGE | INTRAVENOUS | Status: DC | PRN
  Filled 2013-02-15: qty 5

## 2013-02-15 MED ORDER — LIDOCAINE HCL (PF) 1 % IJ SOLN
INTRAMUSCULAR | Status: DC | PRN
  Administered 2013-02-15 (×2): 8 mL

## 2013-02-15 MED ORDER — LACTATED RINGERS IV SOLN: 500.0000 mL | INTRAVENOUS | Status: DC | PRN

## 2013-02-15 NOTE — Anesthesia Procedure Notes (Signed)
Epidural Patient location during procedure: OB Start time: 02/15/2013 2:04 PM End time: 02/15/2013 2:08 PM  Staffing Anesthesiologist: Sandrea Hughs Performed by: anesthesiologist   Preanesthetic Checklist Completed: patient identified, site marked, surgical consent, pre-op evaluation, timeout performed, IV checked, risks and benefits discussed and monitors and equipment checked  Epidural Patient position: sitting Prep: site prepped and draped and DuraPrep Patient monitoring: continuous pulse ox and blood pressure Approach: midline Injection technique: LOR air  Needle:  Needle type: Tuohy  Needle gauge: 17 G Needle length: 9 cm and 9 Needle insertion depth: 6 cm Catheter type: closed end flexible Catheter size: 19 Gauge Catheter at skin depth: 11 cm Test dose: negative and Other  Assessment Sensory level: T9 Events: blood not aspirated, injection not painful, no injection resistance, negative IV test and no paresthesia  Additional Notes Reason for block:procedure for pain

## 2013-02-15 NOTE — H&P (Signed)
Attestation of Attending Supervision of Advanced Practitioner (CNM/NP): Evaluation and management procedures were performed by the Advanced Practitioner under my supervision and collaboration.  I have reviewed the Advanced Practitioner's note and chart, and I agree with the management and plan.  HARRAWAY-SMITH, Korryn Pancoast 9:02 AM     

## 2013-02-15 NOTE — H&P (Signed)
Kimberly Avila is a 32 y.o. female presenting for IOL for A2/B Gestational diabetes based on early 3 hour GTT.  Pt has been on glyburide up with well controlled BS. Last ultrasound 01/24/13 with EFW 3500 g and >90%. No other complications this pregnancy. GDM first pregnancy and 9 lb baby last pregnancy. All SVD with no complications.   Maternal Medical History:  Reason for admission: Nausea.    OB History   Grav Para Term Preterm Abortions TAB SAB Ect Mult Living   5 4 4       4      Past Medical History  Diagnosis Date  . No pertinent past medical history   . Gestational diabetes     glyburide   Past Surgical History  Procedure Laterality Date  . No past surgeries     Family History: family history includes Diabetes in her maternal uncle. Social History:  reports that she has never smoked. She has never used smokeless tobacco. She reports that she does not drink alcohol or use illicit drugs.   Prenatal Transfer Tool  Maternal Diabetes: Yes:  Diabetes Type:  Insulin/Medication controlled Genetic Screening: Declined Maternal Ultrasounds/Referrals: Normal Fetal Ultrasounds or other Referrals:  None Maternal Substance Abuse:  No Significant Maternal Medications:  Meds include: Other: Glyburide Significant Maternal Lab Results:  Lab values include: Group B Strep negative Other Comments:  None  Review of Systems  Constitutional: Negative for fever and chills.  Eyes: Negative for blurred vision and double vision.  Respiratory: Negative for shortness of breath.   Cardiovascular: Negative for chest pain.  Gastrointestinal: Negative for nausea, vomiting, abdominal pain, diarrhea and constipation.  Genitourinary: Negative for dysuria.  Neurological: Negative for dizziness and headaches.    Dilation: 3.5 Effacement (%): 50 Station: -2 Exam by::  (comfirmed by bedside U/S) Blood pressure 117/63, pulse 69, temperature 97.5 F (36.4 C), temperature source Oral, resp. rate 20,  height 5\' 2"  (1.575 m), weight 70.761 kg (156 lb), last menstrual period 05/14/2012. Maternal Exam:  Abdomen: Fetal presentation: vertex  Introitus: Normal vulva. Normal vagina.  Vagina is negative for discharge.  Pelvis: adequate for delivery.   Cervix: Cervix evaluated by digital exam.     Fetal Exam Fetal Monitor Review: Mode: ultrasound.   Baseline rate: 130.  Variability: moderate (6-25 bpm).   Pattern: accelerations present and no decelerations.    Fetal State Assessment: Category I - tracings are normal.     Physical Exam  Constitutional: She is oriented to person, place, and time. She appears well-developed and well-nourished. No distress.  HENT:  Head: Normocephalic and atraumatic.  Eyes: Conjunctivae and EOM are normal.  Neck: Normal range of motion. Neck supple.  Cardiovascular: Normal rate.   Respiratory: Effort normal. No respiratory distress.  GI: Soft. There is no tenderness. There is no rebound and no guarding.  Genitourinary: Vagina normal. No vaginal discharge found.  Musculoskeletal: Normal range of motion. She exhibits no edema and no tenderness.  Neurological: She is alert and oriented to person, place, and time.  Skin: Skin is warm and dry.  Psychiatric: She has a normal mood and affect.    Cervix:  3.5/50/-2, soft posterior   Prenatal labs: ABO, Rh: A/POS/-- (09/09 1118) Antibody: NEG (09/09 1118) Rubella: 237.9 (09/09 1118) RPR: NON REAC (12/09 0954)  HBsAg: NEGATIVE (09/09 1118)  HIV: NON REACTIVE (12/09 0954)  GBS: Negative (02/03 0000)   Assessment/Plan: 32 y.o. G5P4004 at [redacted]w[redacted]d with A2/B GDM on glyburide - IOL with pitocin -  GBS negative - Check CBG q 4 hours - Anticipate SVD, prepare for possible shoulder dystocia with EFW >90%  Napoleon Form 02/15/2013, 8:47 AM

## 2013-02-15 NOTE — Anesthesia Preprocedure Evaluation (Signed)
Anesthesia Evaluation  Patient identified by MRN, date of birth, ID band Patient awake    Reviewed: Allergy & Precautions, H&P , NPO status , Patient's Chart, lab work & pertinent test results  Airway Mallampati: I TM Distance: >3 FB Neck ROM: full    Dental no notable dental hx.    Pulmonary neg pulmonary ROS,    Pulmonary exam normal       Cardiovascular negative cardio ROS      Neuro/Psych negative neurological ROS  negative psych ROS   GI/Hepatic negative GI ROS, Neg liver ROS,   Endo/Other    Renal/GU negative Renal ROS  negative genitourinary   Musculoskeletal negative musculoskeletal ROS (+)   Abdominal Normal abdominal exam  (+)   Peds negative pediatric ROS (+)  Hematology negative hematology ROS (+)   Anesthesia Other Findings   Reproductive/Obstetrics (+) Pregnancy                           Anesthesia Physical Anesthesia Plan  ASA: II  Anesthesia Plan: Epidural   Post-op Pain Management:    Induction:   Airway Management Planned:   Additional Equipment:   Intra-op Plan:   Post-operative Plan:   Informed Consent: I have reviewed the patients History and Physical, chart, labs and discussed the procedure including the risks, benefits and alternatives for the proposed anesthesia with the patient or authorized representative who has indicated his/her understanding and acceptance.     Plan Discussed with:   Anesthesia Plan Comments:         Anesthesia Quick Evaluation

## 2013-02-15 NOTE — Progress Notes (Signed)
Patient ID: CHASELYNN KEPPLE, female   DOB: 1981/11/28, 32 y.o.   MRN: [id number]   S:  Pt feeling ctx but not sufficiently uncomfortable for epidural yet.  O:   Filed Vitals:   02/15/13 1335  BP:   Pulse:   Temp:   Resp: 18   Cervix:  4/50/-2 AROM, clear  FHTs:  130, accels present, no decels, mod variability, Cat I TOCO: q2-4 min    A/P 5 y.o. G5P4004 at [redacted]w[redacted]d with IOL for A2/B GDM - Progressing on pitocin, AROM - Anticipate SVD - monitoring CBG q 4 hours:  68 at 12:09 pm  Napoleon Form, MD

## 2013-02-15 NOTE — Progress Notes (Signed)
Patient ID: LISSA ROWLES, female   DOB: [birth year], 32 y.o.   MRN: [id number]   S:  Feeling ctx but not that strong yet  O:   Filed Vitals:   02/15/13 1128  BP:   Pulse:   Temp:   Resp: 20    Cervix;  4/50/-2  FHTs:  135, mod var, accels present, no decels, Cat I TOCO:  q3-5 min  BS 69 on admission  A/P IOL for GDM Continue to increase pitocin Epidural when uncomfortable CBG q 4 hours Anticipate SVD  Napoleon Form, MD

## 2013-02-16 LAB — GLUCOSE, CAPILLARY: Glucose-Capillary: 75 mg/dL (ref 70–99)

## 2013-02-16 NOTE — Progress Notes (Signed)
Ur chart completed.

## 2013-02-16 NOTE — Progress Notes (Signed)
Post Partum Day 1 Subjective: no complaints, up ad lib, voiding, tolerating PO and patient reports that her bleeding has decreased since yesterday, patient reports she has not been up without assistance but feels well enough to get up and move around this morning  Objective: Blood pressure 107/71, pulse 65, temperature 97.9 F (36.6 C), temperature source Oral, resp. rate 18, height 5\' 2"  (1.575 m), weight 70.761 kg (156 lb), last menstrual period 05/14/2012, SpO2 96.00%, unknown if currently breastfeeding.  Physical Exam:  General: alert, cooperative and no distress Lochia: appropriate Uterine Fundus: firm Incision: n/a DVT Evaluation: No evidence of DVT seen on physical exam. Negative Homan's sign. No cords or calf tenderness. No significant calf/ankle edema.   Recent Labs  02/15/13 0805  HGB 9.4*  HCT 30.8*    Assessment/Plan: Plan for discharge tomorrow, Breastfeeding and Contraception (patient is interested in learning more about both the Mirena and the Nexplanon), no circumcision   LOS: 1 day   Adela Glimpse 02/16/2013, 8:00 AM

## 2013-02-16 NOTE — Progress Notes (Signed)
I have seen and examined this patient and I agree with the above. Cam Hai 8:57 AM 02/16/2013

## 2013-02-16 NOTE — Anesthesia Postprocedure Evaluation (Signed)
  Anesthesia Post-op Note  Patient: Kimberly Avila  Anesthesia Post Note  Patient: Kimberly Avila  Procedure(s) Performed: * No procedures listed *  Anesthesia type: Epidural  Patient location: Mother/Baby  Post pain: Pain level controlled  Post assessment: Post-op Vital signs reviewed  Last Vitals:  Filed Vitals:   02/16/13 0515  BP: 107/71  Pulse: 65  Temp: 36.6 C  Resp: 18    Post vital signs: Reviewed  Level of consciousness:alert  Complications: No apparent anesthesia complications

## 2013-02-17 ENCOUNTER — Telehealth: Payer: Self-pay | Admitting: Obstetrics & Gynecology

## 2013-02-17 NOTE — Discharge Summary (Signed)
Obstetric Discharge Summary Patient reports that she is doing well this morning. She is tolerating POs and has been up and moving the whole day yesterday. She reports that she has had 2 bowel movements, and has been up to the bathroom on her own. She reports that breast feeding is going well and that she is only having to supplement a little with formula, however she plans to continue breast feeding. She reports a continued decrease in her vaginal bleeding and has no other complaints. Patient is ready to return home.  Reason for Admission: induction of labor Prenatal Procedures: none Intrapartum Procedures: spontaneous vaginal delivery Postpartum Procedures: none Complications-Operative and Postpartum: none Hemoglobin  Date Value Range Status  02/15/2013 9.4* 12.0 - 15.0 g/dL Final     HCT  Date Value Range Status  02/15/2013 30.8* 36.0 - 46.0 % Final   Patient's blood sugars have been under than 75 x3 days. 2/27 patient's blood sugar is 73.   Physical Exam:  General: alert, cooperative and no distress Lochia: appropriate Uterine Fundus: firm Incision: n/a DVT Evaluation: No evidence of DVT seen on physical exam. Negative Homan's sign. No cords or calf tenderness. No significant calf/ankle edema.  Discharge Diagnoses: Term Pregnancy-delivered  Discharge Information: Date: 02/17/2013 Activity: unrestricted Diet: routine Medications: Ibuprofen Condition: stable Instructions: refer to practice specific booklet Discharge to: home  Patient plans to get the Nexplanon implant after her 6 week post-partum visit, however does not want the Depo shot in the mean time because she reports that the Depo shot makes her bleed a lot. Patient understands that she could get pregnant over the next 6 weeks without any contraception. No circumcision, breast feeding going well.   Newborn Data: Live born female  Birth Weight: 8 lb 8.7 oz (3875 g) APGAR: 8, 9  Home with mother.  Kimberly Avila 02/17/2013, 7:17 AM  I have seen and examined this patient and agree the above assessment. CRESENZO-DISHMAN,Anapaola Kinsel 02/17/2013 8:00 AM

## 2013-02-17 NOTE — Telephone Encounter (Signed)
Called patient about getting depo provera. She stated she wanted to get the Free Mirena.

## 2013-02-18 ENCOUNTER — Telehealth (HOSPITAL_COMMUNITY): Payer: Self-pay | Admitting: *Deleted

## 2013-02-18 NOTE — Telephone Encounter (Signed)
Resolve episode 

## 2013-03-24 ENCOUNTER — Ambulatory Visit: Payer: Self-pay | Admitting: Advanced Practice Midwife

## 2013-10-03 ENCOUNTER — Encounter: Payer: Medicaid Other | Admitting: Obstetrics & Gynecology

## 2013-10-05 ENCOUNTER — Inpatient Hospital Stay (HOSPITAL_COMMUNITY)
Admission: AD | Admit: 2013-10-05 | Discharge: 2013-10-05 | Disposition: A | Discharge status: Home / Self Care | Payer: Medicaid Other | Source: Ambulatory Visit | Attending: Obstetrics & Gynecology | Admitting: Obstetrics & Gynecology

## 2013-10-05 ENCOUNTER — Encounter (HOSPITAL_COMMUNITY): Payer: Self-pay

## 2013-10-05 DIAGNOSIS — IMO0001 Reserved for inherently not codable concepts without codable children: Secondary | ICD-10-CM | POA: Insufficient documentation

## 2013-10-05 DIAGNOSIS — Y92009 Unspecified place in unspecified non-institutional (private) residence as the place of occurrence of the external cause: Secondary | ICD-10-CM | POA: Insufficient documentation

## 2013-10-05 DIAGNOSIS — O9A212 Injury, poisoning and certain other consequences of external causes complicating pregnancy, second trimester: Secondary | ICD-10-CM

## 2013-10-05 DIAGNOSIS — M7918 Myalgia, other site: Secondary | ICD-10-CM

## 2013-10-05 DIAGNOSIS — W010XXA Fall on same level from slipping, tripping and stumbling without subsequent striking against object, initial encounter: Secondary | ICD-10-CM | POA: Insufficient documentation

## 2013-10-05 DIAGNOSIS — O99891 Other specified diseases and conditions complicating pregnancy: Secondary | ICD-10-CM | POA: Insufficient documentation

## 2013-10-05 DIAGNOSIS — W19XXXA Unspecified fall, initial encounter: Secondary | ICD-10-CM

## 2013-10-05 DIAGNOSIS — W108XXA Fall (on) (from) other stairs and steps, initial encounter: Secondary | ICD-10-CM

## 2013-10-05 LAB — URINALYSIS, ROUTINE W REFLEX MICROSCOPIC
Bilirubin Urine: NEGATIVE
Hgb urine dipstick: NEGATIVE
Ketones, ur: NEGATIVE mg/dL
Protein, ur: NEGATIVE mg/dL
Specific Gravity, Urine: 1.01 (ref 1.005–1.030)
Urobilinogen, UA: 0.2 mg/dL (ref 0.0–1.0)

## 2013-10-05 LAB — URINE MICROSCOPIC-ADD ON

## 2013-10-05 MED ORDER — IBUPROFEN 600 MG PO TABS: 600.0000 mg | ORAL_TABLET | Freq: Four times a day (QID) | ORAL | Status: DC | PRN

## 2013-10-05 NOTE — MAU Note (Signed)
[facility] had an appointment here at the clinic but missed it.

## 2013-10-05 NOTE — MAU Provider Note (Signed)
Chief Complaint: Tailbone Pain   None    SUBJECTIVE HPI: Kimberly Avila is a 32 y.o. [id number] at [redacted]w[redacted]d by LMP who presents to maternity admissions reporting she was carrying her youngest child down the stairs yesterday and slipped and landed on her bottom.  Because she was holding the child and did not want him to fall, she was not able to brace herself and hit the steps hard.  She did not hit her abdomen during the fall.  She reports pain in her tailbone, lower back, and hips since the incident, increasing in intensity today.  She has not yet started prenatal care in this pregnancy but would like to be seen in [facility], as she was seen in clinic with previous pregnancies.  She denies abdominal cramping, LOF, vaginal bleeding, vaginal itching/burning, urinary symptoms, h/a, dizziness, n/v, or fever/chills.    Hospital Spanish translator used for all communication.    Past Medical History  Diagnosis Date  . No pertinent past medical history   . Gestational diabetes     glyburide   Past Surgical History  Procedure Laterality Date  . No past surgeries     History   Social History  . Marital Status: Married    Spouse Name: N/A    Number of Children: N/A  . Years of Education: N/A   Occupational History  . Not on file.   Social History Main Topics  . Smoking status: Never Smoker   . Smokeless tobacco: Never Used  . Alcohol Use: No  . Drug Use: No  . Sexual Activity: Yes    Birth Control/ Protection: None     Comment: wants birth control unable to pay for tubal   Other Topics Concern  . Not on file   Social History Narrative   ** Merged History Encounter **       No current facility-administered medications on file prior to encounter.   Current Outpatient Prescriptions on File Prior to Encounter  Medication Sig Dispense Refill  . Prenatal Vit-Fe Fumarate-FA (PRENATAL MULTIVITAMIN) TABS Take 1 tablet by mouth daily.       Allergies  Allergen Reactions  . Gelatin  Itching  . Nyquil [Pseudoeph-Doxylamine-Dm-Apap] Itching  . Tylenol [Acetaminophen] Itching    ROS: Pertinent items in HPI  OBJECTIVE Blood pressure 112/74, pulse 80, temperature 98.9 F (37.2 C), temperature source Oral, resp. rate 16, height 5\' 2"  (1.575 m), weight 73.936 kg (163 lb), last menstrual period 06/21/2013, SpO2 100.00%. GENERAL: Well-developed, well-nourished female in no acute distress.  HEENT: Normocephalic HEART: normal rate RESP: normal effort ABDOMEN: Soft, non-tender EXTREMITIES: Nontender, no edema NEURO: Alert and oriented  FHT 152 by doppler  LAB RESULTS Results for orders placed during the hospital encounter of 10/05/13 (from the past 24 hour(s))  URINALYSIS, ROUTINE W REFLEX MICROSCOPIC     Status: Abnormal   Collection Time    10/05/13  6:45 PM      Result Value Range   Color, Urine YELLOW  YELLOW   APPearance CLEAR  CLEAR   Specific Gravity, Urine 1.010  1.005 - 1.030   pH 7.0  5.0 - 8.0   Glucose, UA NEGATIVE  NEGATIVE mg/dL   Hgb urine dipstick NEGATIVE  NEGATIVE   Bilirubin Urine NEGATIVE  NEGATIVE   Ketones, ur NEGATIVE  NEGATIVE mg/dL   Protein, ur NEGATIVE  NEGATIVE mg/dL   Urobilinogen, UA 0.2  0.0 - 1.0 mg/dL   Nitrite NEGATIVE  NEGATIVE   Leukocytes, UA MODERATE (*) NEGATIVE  URINE MICROSCOPIC-ADD ON     Status: Abnormal   Collection Time    10/05/13  6:45 PM      Result Value Range   Squamous Epithelial / LPF FEW (*) RARE   WBC, UA 3-6  <3 WBC/hpf   RBC / HPF 0-2  <3 RBC/hpf   Bacteria, UA FEW (*) RARE    ASSESSMENT 1. Traumatic injury during pregnancy, second trimester   2. Fall at home, initial encounter   3. Musculoskeletal pain     PLAN Discharge home Rest/ice/heat/Tylenol safe for pain Short course of ibuprofen for musculoskeletal pain.  Ibuprofen 600 mg PO Q 6 hours x3-4 days.   F/U in clinic--message sent to make appointment for pt. Return to MAU as needed    Medication List         ibuprofen 600 MG tablet   Commonly known as:  ADVIL,MOTRIN  Take 1 tablet (600 mg total) by mouth every 6 (six) hours as needed for pain.     prenatal multivitamin Tabs tablet  Take 1 tablet by mouth daily.           Follow-up Information   Follow up with [facility]. (The clinic will call you with an appointment or call the number listed below.  Return to MAU as needed. La clnica le llamar con una cita o llame al nmero que aparece a continuacin. Regresar a MAU segn sea necesario . )    Specialty:  Obstetrics and Gynecology   Contact information:   [address] [place] [address] [phone]      Sharen Counter Certified Nurse-Midwife 10/05/2013  7:39 PM

## 2013-10-05 NOTE — MAU Note (Signed)
Patient states that yesterday she was carrying a child andslipped down one stair and fell on her buttocks onto a wooden area. She had leaking of clear fluid when she got up but has not had any since that time. Denies bleeding.  States she is now having pain in the back and the abdomen.

## 2013-10-06 LAB — URINE CULTURE: Colony Count: NO GROWTH

## 2013-10-10 ENCOUNTER — Ambulatory Visit (INDEPENDENT_AMBULATORY_CARE_PROVIDER_SITE_OTHER): Payer: Medicaid Other | Admitting: Obstetrics and Gynecology

## 2013-10-10 ENCOUNTER — Encounter: Payer: Self-pay | Admitting: Obstetrics and Gynecology

## 2013-10-10 VITALS — BP 118/73 | Temp 98.4°F | Wt 162.0 lb

## 2013-10-10 DIAGNOSIS — O09892 Supervision of other high risk pregnancies, second trimester: Secondary | ICD-10-CM

## 2013-10-10 DIAGNOSIS — O09899 Supervision of other high risk pregnancies, unspecified trimester: Secondary | ICD-10-CM | POA: Insufficient documentation

## 2013-10-10 DIAGNOSIS — O0992 Supervision of high risk pregnancy, unspecified, second trimester: Secondary | ICD-10-CM

## 2013-10-10 DIAGNOSIS — O09292 Supervision of pregnancy with other poor reproductive or obstetric history, second trimester: Secondary | ICD-10-CM

## 2013-10-10 DIAGNOSIS — O0990 Supervision of high risk pregnancy, unspecified, unspecified trimester: Secondary | ICD-10-CM | POA: Insufficient documentation

## 2013-10-10 DIAGNOSIS — O09299 Supervision of pregnancy with other poor reproductive or obstetric history, unspecified trimester: Secondary | ICD-10-CM | POA: Insufficient documentation

## 2013-10-10 LAB — POCT URINALYSIS DIP (DEVICE)
Ketones, ur: NEGATIVE mg/dL
Ketones, ur: NEGATIVE mg/dL
Protein, ur: NEGATIVE mg/dL
Protein, ur: NEGATIVE mg/dL
Specific Gravity, Urine: >=1.03 (ref 1.005–1.030)
pH: 6 (ref 5.0–8.0)
pH: 6 (ref 5.0–8.0)

## 2013-10-10 NOTE — Addendum Note (Signed)
Addended by: Franchot Mimes on: 10/10/2013 12:09 PM   Modules accepted: Orders

## 2013-10-10 NOTE — Progress Notes (Deleted)
Nutrition note

## 2013-10-10 NOTE — Progress Notes (Signed)
Pulse- 84  Pain-back  Pt went to MAU on 10/05/13 for fall.  Pt continues lower back pain Weight gain of 25-35lbs Declined flu vaccine

## 2013-10-10 NOTE — Progress Notes (Signed)
Nutrition note: 1st visit consult Pt has gained 9# @ [redacted]w[redacted]d, which is slightly > expected. Pt reports eating 4x/d. 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 Pt reports N/V in the morning and has heartburn, which tums has helped relieve. Pt received verbal & written education in Spanish via [place], interpreter, about general nutrition during pregnancy. Discussed tips to decrease N/V and heartburn. Encouraged pt to ask pharmacist about vitamins that do not contain gelatin.  Discussed wt gain goals of 15-25# or 0.6#/wk. Pt agrees to ask about appropriate PNV. Pt has WIC and plans to BF. F/u if referred Blondell Reveal, MS, RD, LDN

## 2013-10-10 NOTE — Patient Instructions (Signed)
Embarazo - Segundo trimestre (Pregnancy - Second Trimester) El segundo trimestre del Psychiatrist (del 3 al 6 mes) es un perodo de evolucin rpida para usted y el beb. Hacia el final del sexto mes, el beb mide aproximadamente 23 cm y pesa 680 g. Comenzar a Pharmacologist del beb [place] 18 y las 20 [address] de [place]. Podr sentir las pataditas ("quickening en ingls"). Hay un rpido [employer]. Puede segregar un lquido claro Charity fundraiser) de las [place]. Quizs sienta pequeas contracciones en el vientre (tero) Esto se conoce como falso trabajo de parto o contracciones de Braxton-Hicks. Es como una prctica del trabajo de parto que se produce cuando el beb est listo para salir. Generalmente los problemas de vmitos matinales ya se han superado hacia el final del Medical sales representative. Algunas mujeres desarrollan pequeas manchas oscuras (que se denominan cloasma, mscara del embarazo) en la cara que normalmente se van luego del nacimiento del beb. La exposicin al sol empeora las manchas. Puede desarrollarse acn en algunas mujeres embarazadas, y puede desaparecer en aquellas que ya tienen acn. EXAMENES PRENATALES  Durante los [employer], deber seguir realizando pruebas de [place], segn avance el [place]. Estas pruebas se realizan para controlar su salud y la del beb. Tambin se realizan anlisis de sangre para [employer] niveles de [place]. La anemia (bajo nivel de hemoglobina) es frecuente durante el embarazo. Para prevenirla, se administran hierro y vitaminas. Tambin se le realizarn exmenes para saber si tiene diabetes entre las 24 y las 28 semanas del [place]. Podrn repetirle algunas de las H[employer] Enterprises hicieron previamente.  En cada visita le medirn el tamao del tero. Esto se realiza para asegurarse de que el beb est creciendo correctamente de acuerdo al estado del [place].  Tambin en cada visita prenatal controlarn su presin arterial. Esto se realiza  para asegurarse de que no tenga toxemia.  Se controlar su orina para asegurarse de que no tenga infecciones, diabetes o protena en la orina.  Se controlar su peso regularmente para asegurarse que el aumento ocurre al ritmo indicado. Esto se hace para asegurarse que usted y el beb tienen una evolucin normal.  En algunas ocasiones se realiza una prueba de ultrasonido para confirmar el correcto desarrollo y evolucin del beb. Esta prueba se realiza con ondas sonoras inofensivas para el beb, de modo que el profesional pueda calcular ms precisamente la fecha del [place]. Algunas veces se realizan pruebas especializadas del lquido amnitico que rodea al beb. Esta prueba se denomina amniocentesis. El lquido amnitico se obtiene introduciendo una aguja en el vientre (abdomen). Se realiza para Conservator, museum/gallery en los que existe alguna preocupacin acerca de algn problema gentico que pueda sufrir el beb. En ocasiones se lleva a cabo cerca del final del embarazo, si es necesario inducir al [employer]. En este caso se realiza para asegurarse que los pulmones del beb estn lo suficientemente maduros como para que pueda vivir fuera del tero. CAMBIOS QUE OCURREN EN EL SEGUNDO TRIMESTRE DEL EMBARAZO Su organismo atravesar numerosos cambios durante el [employer]. Estos pueden variar de Neomia Dear persona a otra. [place] con el profesional que la asiste acerca los cambios que usted note y que la preocupen.  Durante el segundo trimestre probablemente sienta un aumento del apetito. Es normal tener "antojos" de Development worker, community. Esto vara de Neomia Dear persona a otra y de un embarazo a Therapist, art.  El abdomen inferior comenzar a abultarse.  Podr tener la necesidad de Geographical information systems officer con ms frecuencia debido a  que el tero y el beb presionan sobre la vejiga. Tambin es frecuente contraer ms infecciones urinarias durante el [employer]. Puede evitarlas bebiendo gran cantidad de lquidos y vaciando la vejiga antes y  despus de Sales promotion account executive.  Podrn aparecer las primeras estras en las caderas, abdomen y [place]. Estos son cambios normales del cuerpo durante el [place]. No existen medicamentos ni ejercicios que puedan prevenir [employer].  Es posible que comience a desarrollar venas inflamadas y abultadas (varices) en las piernas. El uso de medias de descanso, Optometrist sus pies durante 15 minutos, 3 a 4 veces al da y Film/video editor la sal en su dieta ayuda a Journalist, newspaper.  Podr sentir Engineering geologist gstrica a medida que el tero crece y Doctor, general practice. Puede tomar anticidos, con la autorizacin de su mdico, para Financial planner. Tambin es til ingerir pequeas comidas 4 a 5 veces al Futures trader.  La constipacin puede tratarse con un laxante o agregando fibra a su dieta. Beber grandes cantidades de lquidos, comer vegetales, frutas y granos integrales es de [place].  Tambin es beneficioso practicar actividad fsica. Si ha sido una persona Engineer, mining, podr continuar con la [employer]ley[employer]son de las actividades durante el mismo. Si ha sido [employer], puede ser beneficioso que comience con un programa de ejercicios, Museum/gallery exhibitions officer.  Puede desarrollar hemorroides hacia el final del segundo trimestre. Tomar baos de asiento tibios y Chemical engineer cremas recomendadas por el profesional que lo asiste sern de ayuda para los problemas de hemorroides.  Tambin podr Financial risk analyst de espalda durante este momento de su embarazo. Evite levantar objetos pesados, utilice zapatos de taco bajo y [place] buena postura para ayudar a reducir los problemas de [place].  Algunas mujeres embarazadas desarrollan hormigueo y adormecimiento de la mano y los dedos debido a la hinchazn y compresin de los ligamentos de la mueca (sndrome del tnel carpiano). Esto desaparece una vez que el beb nace.  Como sus pechos se agrandan, Pension scheme manager un sujetador ms grande. Use un sostn de soporte,  cmodo y de algodn. No utilice un sostn para amamantar hasta el ltimo mes de embarazo si va a amamantar al beb.  Podr observar una lnea oscura desde el ombligo hacia la zona pbica denominada linea nigra.  Podr observar que sus mejillas se ponen coloradas debido al aumento de flujo sanguneo en la cara.  Podr desarrollar "araitas" en la cara, cuello y pecho. Esto desaparece una vez que el beb nace. INSTRUCCIONES PARA EL CUIDADO DOMICILIARIO  Es extremadamente importante que evite el cigarrillo, hierbas medicinales, alcohol y las drogas no prescriptas durante el Psychiatrist. Estas sustancias qumicas afectan la formacin y el desarrollo del beb. Evite estas sustancias durante todo el embarazo para asegurar el nacimiento de un beb sano.  La mayor parte de los cuidados que se aconsejan son los mismos que los indicados para Financial risk analyst trimestre del Psychiatrist. Cumpla con las citas tal como se le indic. Siga las instrucciones del profesional que lo asiste con respecto al uso de los medicamentos, el ejercicio y Psychologist, forensic.  Durante el embarazo debe obtener nutrientes para usted y para su beb. Consuma alimentos balanceados a intervalos regulares. Elija alimentos como carne, pescado, [place] y otros productos lcteos descremados, vegetales, frutas, panes integrales y cereales. El Equities trader cul es el aumento de peso ideal.  Las relaciones sexuales fsicas pueden continuarse hasta cerca del fin del embarazo si no existen otros problemas. Estos problemas pueden ser la prdida temprana (  prematura) de lquido amnitico de las [place], sangrado vaginal, dolor abdominal u otros problemas mdicos o del Psychiatrist.  Realice [employer], si no tiene restricciones. Consulte con el profesional que la asiste si no sabe con certeza si determinados ejercicios son seguros. El mayor aumento de peso tiene Environmental consultant durante los ltimos 2 trimestres del Psychiatrist. El ejercicio la ayudar  a:  Engineering geologist.  Ponerla en forma para el parto.  Ayudarla a perder peso luego de haber dado a luz.  Use un buen sostn o como los que se usan para hacer deportes para Paramedic la sensibilidad de las [place]. Tambin puede serle til si lo [place] mientras duerme. Si pierde Product manager, podr [employer]in.  No utilice la baera con agua caliente, baos turcos y saunas durante el [address]  Utilice el cinturn de seguridad sin excepcin cuando conduzca. Este la proteger a usted y al beb en caso de accidente.  Evite comer carne cruda, queso crudo, y el contacto con los utensilios y desperdicios de los gatos. Estos elementos contienen grmenes que pueden causar defectos de nacimiento en el beb.  El segundo trimestre es un buen momento para visitar a su dentista y Software engineer si an no lo ha hecho. Es Primary school teacher los dientes limpios. Utilice un cepillo de dientes blando. Cepllese ms suavemente durante el embarazo.  Es ms fcil perder algo de orina durante el [place]. Apretar y Chief Operating Officer los msculos de la pelvis la ayudar con este problema. Practique detener la miccin cuando est en el bao. Estos son los mismos msculos que Development worker, international aid. Son [employer] mismos msculos que utiliza cuando trata de [employer] gases. Puede practicar apretando estos msculos 10 veces, y repetir esto 3 veces por da aproximadamente. Una vez que conozca qu msculos debe apretar, no realice estos ejercicios durante la miccin. Puede favorecerle una infeccin si la orina vuelve hacia atrs.  Pida ayuda si tiene necesidades econmicas, de asesoramiento o nutricionales durante el [place]. El profesional podr ayudarla con respecto a estas necesidades, o derivarla a otros especialistas.  La piel puede ponerse grasa. Si esto sucede, lvese la cara con un jabn [place], utilice un humectante no graso y [place] con base de aceite o crema. CONSUMO DE MEDICAMENTOS Y DROGAS  DURANTE EL EMBARAZO  Contine tomando las vitaminas apropiadas para esta etapa tal como se le indic. Las vitaminas deben contener un miligramo de cido flico y deben suplementarse con hierro. Guarde todas las vitaminas fuera del alcance de los nios. La ingestin de slo un par de vitaminas o tabletas que contengan hierro puede ocasionar la [employer] en un beb o en un nio pequeo.  Evite el uso de [place], inclusive los de venta libre y hierbas que no hayan sido prescriptos o indicados por el profesional que la asiste. Algunos medicamentos pueden causar problemas fsicos al beb. Utilice los medicamentos de venta libre o de prescripcin para Chief Technology Officer, Environmental health practitioner o la [place], segn se lo indique el profesional que lo asiste. No utilice aspirina.  El consumo de alcohol est relacionado con ciertos defectos de nacimiento. Esto incluye el sndrome de alcoholismo fetal. Debe evitar el consumo de alcohol en cualquiera de sus formas. El cigarrillo causa nacimientos prematuros y bebs de [place]. El uso de drogas recreativas est absolutamente prohibido. Son muy nocivas para el beb. Un beb que nace de [employer], ser adicto al nacer. Ese beb tendr los mismos sntomas de abstinencia que un adulto.  Infrmele al profesional si consume alguna droga.  No consuma drogas ilegales. Pueden causarle mucho dao al beb. SOLICITE ATENCIN MDICA SI: Tiene preguntas o preocupaciones durante su embarazo. Es mejor que llame para Science writer las dudas que esperar hasta su prxima visita prenatal. Thressa Sheller forma se sentir ms tranquila.  SOLICITE ATENCIN MDICA DE INMEDIATO SI:  La temperatura oral se eleva sin motivo por encima de 102 F (38.9 C) o segn le indique el profesional que lo asiste.  Tiene una prdida de lquido por la vagina (canal de parto). Si sospecha una ruptura de las [place], tmese la temperatura y llame al profesional para informarlo sobre esto.  Observa unas pequeas manchas,  una hemorragia vaginal o elimina cogulos. Notifique al profesional acerca de la cantidad y de cuntos apsitos est utilizando. Unas pequeas manchas de sangre son algo comn durante el Psychiatrist, especialmente despus de Sales promotion account executive.  Presenta un olor desagradable en la secrecin vaginal y observa un cambio en el color, de transparente a blanco.  Contina con las nuseas y no obtiene alivio de los remedios indicados. Vomita sangre o algo similar a la borra del caf.  [place] o sube ms de 900 g. en una semana, o segn lo indicado por el profesional que la asiste.  Observa que se le [employer], las manos, los pies o las piernas.  Ha estado expuesta a la rubola y no ha sufrido la enfermedad.  Ha estado expuesta a la quinta enfermedad o a la varicela.  Presenta dolor abdominal. Las molestias en el ligamento redondo son Neomia Dear causa no cancerosa (benigna) frecuente de dolor abdominal durante el embarazo. El profesional que la asiste deber evaluarla.  Presenta dolor de cabeza intenso que no se [place].  Presenta fiebre, diarrea, dolor al orinar o le falta la respiracin.  Presenta dificultad para ver, visin borrosa, o visin doble.  Sufre una cada, un accidente de trnsito o cualquier tipo de trauma.  Vive en un hogar en el que existe violencia fsica o mental. Document Released: 09/17/2005 Document Revised: 09/01/2012 [facility] Patient Information 2014 [place], [place].  Eleccin del mtodo anticonceptivo  (Contraception Choices) La anticoncepcin (control de la natalidad) es el uso de cualquier mtodo o dispositivo para Location manager. A continuacin se indican algunos de esos mtodos.  MTODOS HORMONALES   Implante anticonceptivo. Es un tubo plstico delgado que contiene la hormona progesterona. No contiene estrgenos. El mdico inserta el tubo en la parte interna del brazo. El tubo puede Geneticist, molecular durante 3 aos. Despus de los 3 aos debe  retirarse. El implante impide que los ovarios liberen vulos (ovulacin), espesa el moco cervical, lo que evita que los espermatozoides ingresen al tero y hace ms delgada la membrana que cubre el interior del tero.  Inyecciones de progesterona sola. Estas inyecciones se administran cada 3 meses para evitar el embarazo. La progesterona sinttica impide que los ovarios liberen vulos. Tambin hace que el moco cervical se espese y modifica el recubrimiento interno del tero. Esto hace ms difcil que los espermatozoides sobrevivan en el tero.  Pldoras anticonceptivas. Las pldoras anticonceptivas contienen estrgenos y Education officer, museum. Actan impidiendo que el vulo se forme en el ovario(ovulacin). Las pldoras anticonceptivas son recetadas por el mdico.Tambin se utilizan para tratar los perodos menstruales abundantes.  Minipldora. Este tipo de pldora anticonceptiva contiene slo hormona progesterona. Deben tomarse todos los [address] del mes y debe recetarlas el mdico.  Parches anticonceptivos. El parche contiene hormonas similares a las que contienen las pldoras anticonceptivas.  Deben cambiarse una vez por semana y se utilizan bajo prescripcin mdica.  Anillo vaginal. Anillo vaginal contiene hormonas similares a las que contienen las pldoras anticonceptivas. Se deja colocado durante tres semanas, se lo retira durante 1 semana y luego se coloca uno nuevo. La paciente debe sentirse cmoda para insertar y retirar el anillo de la vagina.Es necesaria la receta del mdico.  Anticonceptivos de Associate Professor. Los anticonceptivos de emergencia son mtodos para evitar un embarazo despus de una relacin sexual sin proteccin. Esta pldora puede tomarse inmediatamente despus de Child psychotherapist sexuales o hasta 5 [place] de haber tenido sexo sin proteccin. Es ms efectiva si se toma poco tiempo despus. Los anticonceptivos de emergencia estn disponibles sin prescripcin mdica. Consltelo con su farmacutico.  No use los anticonceptivos de emergencia como nico mtodo anticonceptivo. MTODOS DE BARRERA   Condn masculino. Es una vaina delgada (ltex o goma) que se [place] en el pene durante el acto sexual. Deri Fuelling con espermicida para aumentar la efectividad.  Condn femenino. Es una vaina blanda y floja que se adapta suavemente a la vagina antes de las relaciones sexuales.  Diafragma. Es una barrera de ltex redonda y Casimer Bilis que debe ser ajustada por un profesional. Se inserta en la vagina, junto con un gel espermicida. Debe insertarse antes de Management consultant. Debe dejar el diafragma colocado en la vagina durante 6 a 8 horas despus de la relacin sexual.  Capuchn cervical. Es una taza de ltex o plstico, redonda y [place] que cubre el cuello del tero y debe ser ajustada por un mdico. Puede dejarlo colocado en la vagina hasta 48 horas despus de las Clinical research associate.  Esponja. Es una pieza blanda y circular de espuma de poliuretano. Contiene un espermicida. Se inserta en la vagina despus de mojarla y antes de las [employer].  Espermicidas. Los espermicidas son qumicos que matan o bloquean el esperma y no lo dejan ingresar al cuello del tero y al tero. Vienen en forma de cremas, geles, supositorios, espuma o comprimidos. No es necesario tener Emergency planning/management officer. Se insertan en la vagina con un aplicador antes de Management consultant. El proceso debe repetirse cada vez que tiene relaciones sexuales. ANTICONCEPTIVOS INTRAUTERINOS   Dispositivo intrauterino (DIU). Es un dispositivo en forma de T que se coloca en el tero durante el perodo menstrual, para Location manager. Hay dos tipos:  DIU de cobre. Este tipo de DIU est recubierto con un alambre de cobre y se inserta dentro del tero. El cobre hace que el tero y las trompas de Falopio produzcan un liquido que [employer] espermatozoides. Puede permanecer colocado durante 10 aos.  DIU hormonal. Este tipo de DIU  contiene la hormona progestina (progesterona sinttica). La hormona espesa el moco cervical y evita que los espermatozoides ingresen al tero y tambin afina la membrana que cubre el tero para evitar la implantacin del vulo fertilizado. La hormona debilita o destruye los espermatozoides que ingresan al tero. Puede permanecer colocado durante 5 aos. MTODOS ANTICONCEPTIVOS PERMANENTES   Ligadura de trompas en la mujer. La ligadura de trompas en la mujer se realiza sellando, atando u obstruyendo quirrgicamente las trompas de Falopio lo que impide que el vulo descienda hacia el tero.  Esterilizacin masculina. Se realiza atando los conductos por los que pasan los espermatozoides (vasectoma).Esto impide que el esperma ingrese a la vagina durante el acto sexual. Luego del procedimiento, el hombre puede eyacular lquido (semen). MTODOS DE PLANIFICACIN NATURAL   Planificacin familiar natural.  Consiste en no  tener [employer] o usar un mtodo de barrera (condn, [place], capuchn cervical) en los [employer] la mujer podra quedar [place].  Mtodo calendario.  Consiste en el seguimiento de la duracin de cada ciclo menstrual y la identificacin de los perodos frtiles.  Mtodo de Occupational hygienist.  Consiste en evitar las relaciones sexuales durante la ovulacin.  Mtodo sintotrmico. Paramedic las relaciones sexuales en la poca en la que se est ovulando, utilizando un termmetro y tendiendo en cuenta los sntomas de la ovulacin.  Mtodo post-ovulacin. Consiste en planificar las relaciones sexuales para despus de haber ovulado. Independientemente del tipo o mtodo anticonceptivo que usted elija, es importante que use condones para protegerse contra las enfermedades de transmisin sexual (ETS). Hable con su mdico con respecto a qu mtodo anticonceptivo es el ms apropiado para usted.  Document Released: 12/08/2005 Document Revised: 03/01/2012 [facility] Patient  Information 2014 [place], [place].  Lactancia materna  (Breastfeeding)  El cambio hormonal durante el Psychiatrist produce el desarrollo del tejido [place] y un aumento en el nmero y tamao de los conductos galactforos. La hormona prolactina permite que las protenas, los azcares y las grasas de la sangre produzcan la [employer] materna en las glndulas productoras de [place]. La hormona progesterona impide que la leche materna sea liberada antes del nacimiento del beb. Despus del nacimiento del beb, su nivel de progesterona disminuye permitiendo que la leche materna sea [place]. Pensar en el beb, as como la succin o Theatre manager, pueden estimular la liberacin de Elsa de las glndulas productoras de [place].  La decisin de Company secretary) es una de las mejores opciones que usted puede hacer para usted y su beb. La informacin que sigue da una breve resea de los beneficios, as Lexicographer que debe saber sobre la [place].  LOS BENEFICIOS DE AMAMANTAR  Para el beb   La primera leche (calostro) ayuda al mejor funcionamiento del sistema digestivo del beb.   La leche tiene anticuerpos que provienen de la madre y que ayudan a prevenir las infecciones en el beb.   El beb tiene una menor incidencia de asma, alergias y del sndrome de muerte sbita del lactante (SMSL).   Los nutrientes de la [place] materna son mejores para el beb que la [place].  La leche materna mejora el desarrollo cerebral del beb.   Su beb tendr menos gases, clicos y estreimiento.  Es menos probable que el beb desarrolle otras enfermedades, como obesidad infantil, asma o diabetes mellitus. Para usted   La lactancia materna favorece el desarrollo de un vnculo muy especial entre la madre y el beb.   Es ms conveniente, siempre disponible, a la [place] y [place].   La lactancia materna ayuda a quemar caloras y a perder el peso ganado durante el  [place].   Hace que el tero se contraiga ms rpidamente a su tamao normal y Consolidated Edison sangrado despus del [place].   Las [employer] que amamantan tienen menos riesgo de Environmental education officer osteoporosis o cncer de mama o de ovario en el futuro.  FRECUENCIA DEL AMAMANTAMIENTO   Un beb sano, nacido a trmino, puede amamantarse con tanta frecuencia como cada hora, o espaciar las comidas cada tres horas. La frecuencia en la lactancia varan de un beb a otro.   Los recin nacidos deben ser alimentados por lo menos cada 2-3 horas Administrator y cada 4-5 horas durante la noche. Usted debe amamantarlo un mnimo de 8 tomas en un perodo de 24  horas.  Despierte al beb para amamantarlo si han pasado 3-4 horas desde la ltima comida.  Amamante cuando sienta la necesidad de reducir la plenitud de sus senos o cuando el beb muestre signos de [place]. Las seales de que el beb puede Gentry Fitz son:  Lenora Boys su estado de alerta o vigilancia.  Se estira.  Mueve la cabeza de un lado a otro.  Mueve la cabeza y abre la boca cuando se le toca la mejilla o la boca (reflejo de succin).  Aumenta las vocalizaciones, tales como sonidos de succin, relamerse los labios, arrullos, suspiros, o chirridos.  Mueve la [employer] boca.  Se chupa con ganas los dedos o las manos.  Agitacin.  Llanto intermitente.  Los signos de hambre extrema requerirn que lo calme y lo consuele antes de tratar de alimentarlo. Los signos de hambre extrema son:  Agitacin.  Llanto fuerte e intenso.  Gritos.  El amamantamiento frecuente la ayudar a producir ms [place] y a Education officer, community de Engineer, mining en los pezones e hinchazn de las [place].  LACTANCIA MATERNA   Ya sea que se encuentre acostada o sentada, asegrese que el abdomen del beb est enfrente el suyo.   Sostenga la mama con el pulgar por arriba y los otros 4 dedos por debajo del pezn. Asegrese que sus dedos se encuentren lejos del pezn y de la boca del  beb.   Empuje suavemente los labios del beb con el pezn o con el dedo.   Cuando la boca del beb se abra lo suficiente, introduzca el pezn y la zona oscura que lo rodea (areola) tanto como le sea posible dentro de la boca.  Debe haber ms areola visible por arriba del labio superior que por debajo del labio inferior.  La lengua del beb debe estar entre la enca inferior y el seno.  Asegrese de que la boca del beb est en la posicin correcta alrededor del pezn (prendida). Los labios del beb deben crear un sello sobre su pecho.  Las seales de que el beb se ha prendido eficazmente al pezn son:  Payton Doughty o succiona sin dolor.  Se escucha que traga [employer].  No hace ruidos ni chasquidos.  Hay movimientos musculares por arriba y por delante de sus odos al Printmaker.  El beb debe succionar unos 2-3 minutos para que salga la [place]. Permita que el nio se alimente en cada mama todo lo que desee. Alimente al beb hasta que se desprenda o se quede dormido en Freight forwarder y luego ofrzcale el segundo pecho.  Las seales de que el beb est lleno y satisfecho son:  Disminuye gradualmente el nmero de succiones o no succiona.  Se queda dormido.  Extiende o relaja su cuerpo.  Retiene una pequea cantidad de [facility] boca.  Se desprende del pecho por s mismo.  Los signos de una lactancia materna eficaz son:  Los senos han aumentado la firmeza, el peso y el tamao antes de la alimentacin.  Son ms blandos despus de amamantar.  Un aumento del volumen de [place], y tambin el cambio de su consistencia y color se producen hacia el quinto da de Tour manager.  La congestin mamaria se [place] al dar de [place].  Los pezones no duelen, ni estn agrietados ni sangran.  De ser necesario, interrumpa la succin poniendo su dedo en la esquina de la boca del beb y deslizando el dedo entre sus encas. A continuacin, retire la mama de su boca.  Es comn que  los bebs regurgiten un poco despus de comer.  A menudo los bebs tragan aire al alimentarse. Esto puede hacer que se sienta molesto. Hacer eructar al beb al [place] de pecho puede ser de [place].  Se recomiendan suplementos de vitamina D para los bebs que reciben slo [address]  Evite el uso del chupete durante las primeras 4 a 6 semanas de vida.  Evite la alimentacin suplementaria con agua, frmula o jugo en lugar de la [employer]. La leche materna es todo el alimento que el beb necesita. No es necesario que el nio ingiera agua o preparados de bibern. Sus pechos producirn ms leche si se evita la alimentacin suplementaria durante las primeras semanas. COMO SABER SI EL BEB OBTIENE LA SUFICIENTE LECHE MATERNA  Preguntarse si el beb obtiene la cantidad suficiente de [place] es una preocupacin frecuente [employer]. Puede asegurarse que el beb tiene la leche suficiente si:   El beb succiona activamente y usted escucha que traga.   El beb parece estar relajado y satisfecho despus de Psychologist, clinical.   El nio se alimenta al menos 8 a 12 veces en 24 horas.  Durante los primeros 3 a 5 das de vida:  Moja 3-5 paales en 24 horas. La materia fecal debe ser blanda y [place].  Tiene al menos 3 a 4 deposiciones en 24 horas. La materia fecal debe ser blanda y [place].  A los 5-7 das de vida, el beb debe tener al menos 3-6 deposiciones en 24 horas. La materia fecal debe ser grumosa y [place] a los 5 [address]  [address] de [place].  Su beb tiene una prdida de Psychologist, counselling a 7al 10% durante los primeros 3 [address]  [address] de [address]  El beb no pierde peso despus de 3-7 [address] de [address]  El beb debe aumentar 4 a 6 libras (120 a 170 gr.) por semana despus de los 4 [address]  [address] de vida.  Aumenta de [place] a los [address] de vida y vuelve al peso del nacimiento dentro de las 2 semanas. CONGESTIN MAMARIA  Durante la primera semana despus del [place], usted puede experimentar hinchazn en las mamas (congestin  [place]). Al estar congestionadas, las mamas se sienten pesadas, calientes o sensibles al tacto. El pico de la congestin ocurre a las 24 -48 horas despus del parto.   La congestin puede disminuirse:  Continuando con la Tour manager.  Aumentando la frecuencia.  Tomando duchas calientes o aplicando calor hmedo en los senos antes de cada comida. Esto aumenta la circulacin y [place] a que la [place].   Masajeando suavemente el pecho antes y [place] [employer]. Con las yemas de los dedos, masajee la pared del pecho hacia el pezn en un movimiento circular.   Asegurarse de que el beb vaca al menos uno de sus pechos en cada alimentacin. Tambin ayuda si comienza la siguiente toma en el otro seno.   Extraiga manualmente o con un sacaleches las mamas para vaciar los pechos si el beb tiene sueo o no se aliment bien. Tambin puede extraer la [employer] cuando vuelva a trabajar o si siente que se estn congestionando las [place].  Asegrese de que el beb se prende y est bien colocado durante la Market researcher. Si sigue estas indicaciones, la congestin debe mejorar en 24 a 48 horas. Si an tiene dificultades, consulte a Barista.  CUDESE USTED MISMA  Cuide sus mamas.   Bese o dchese diariamente.   Evite usar [employer].  Use un sostn de soporte Evite el uso de sostenes con aro.  Seque al aire sus pezones durante 3-4 minutos despus de cada comida.   Utilice slo apsitos de algodn en el sostn para absorber las prdidas de [place]. La prdida de un poco de [employer] las comidas es normal.   Use solamente lanolina pura en sus pezones despus de Museum/gallery exhibitions officer. Usted no tiene que lavarla antes de alimentar al beb. Otra opcin es sacarse unas gotas de [place] y [employer] pezones.  Continuar con los autocontroles de la mama. Cudese.   Consuma alimentos saludables. Alterne 3 comidas con 3 colaciones.  Evite los alimentos que usted  nota que perjudican al beb.  Dixie Dials, jugos de fruta y agua para Patent examiner su sed (aproximadamente 8 vasos al Futures trader).   Descanse con frecuencia, reljese y tome sus vitaminas prenatales para evitar la fatiga, el estrs y la anemia.  Evite masticar y fumar tabaco.  Evite el consumo de alcohol y drogas.  Tome medicamentos de venta libre y recetados tal como le indic su mdico o Social research officer, government. Siempre debe consultar con su mdico o farmacutico antes de tomar cualquier medicamento, vitamina o suplemento de hierbas.  Sepa que durante la lactancia puede quedar embarazada. Si lo desea, hable con su mdico acerca de la planificacin familiar y los mtodos anticonceptivos seguros que puede utilizar durante la Market researcher. SOLICITE ATENCIN MDICA SI:   Usted siente que quiere dejar de Museum/gallery exhibitions officer o se siente frustrada con la lactancia.  Siente dolor en los senos o en los pezones.  Sus pezones estn agrietados o Water quality scientist.  Sus pechos estn irritados, sensibles o calientes.  Tiene un rea hinchada en cualquiera de los senos.  Siente escalofros o fiebre.  Tiene nuseas o vmitos.  Observa un drenaje en los pezones.  Sus mamas no se llenan antes de Marine scientist al 5to da despus del [place].  Se siente triste y deprimida.  El nio est demasiado somnoliento como para comer.  El nio tiene problemas para Industrial/product designer.   Moja menos de 3 paales en 24 horas.  Mueve el intestino menos de 3 veces en 24 horas.  La piel del beb o la parte blanca de sus ojos est ms amarilla.   El beb no ha aumentado de [place] a los [address] de [place]. ASEGRESE DE QUE:   Comprende estas instrucciones.  Controlar su enfermedad.  Solicitar ayuda de inmediato si no mejora o si empeora. Document Released: 12/08/2005 Document Revised: 09/01/2012 [facility] Patient Information 2014 [place], [place].

## 2013-10-10 NOTE — Progress Notes (Signed)
   Subjective:    Kimberly Avila is a [id number] [redacted]w[redacted]d being seen today for her first obstetrical visit.  Her obstetrical history is significant for history of GDM A2/B, short interval between pregnancies. Patient does intend to breast feed. Pregnancy history fully reviewed.  Patient reports no complaints.  Filed Vitals:   10/10/13 0920  BP: 118/73  Temp: 98.4 F (36.9 C)    HISTORY: OB History  Gravida Para Term Preterm AB SAB TAB Ectopic Multiple Living  6 5 5       5     # Outcome Date GA Lbr Len/2nd Weight Sex Delivery Anes PTL Lv  6 CUR           5 TRM 02/15/13 [redacted]w[redacted]d 01:35 / 00:10 8 lb 8.7 oz (3.875 kg) M SVD EPI  Y     Comments: WNL  4 TRM 10/28/09 [redacted]w[redacted]d  9 lb (4.082 kg) M SVD   Y  3 TRM 11/25/07 [redacted]w[redacted]d  8 lb 4 oz (3.742 kg) M SVD   Y  2 TRM 09/25/04 [redacted]w[redacted]d  7 lb (3.175 kg) M SVD   Y  1 TRM 02/21/02 [redacted]w[redacted]d  6 lb 6 oz (2.892 kg) F SVD   Y     Past Medical History  Diagnosis Date  . No pertinent past medical history   . Gestational diabetes     glyburide   Past Surgical History  Procedure Laterality Date  . No past surgeries     Family History  Problem Relation Age of Onset  . Diabetes Maternal Uncle      Exam    Uterus:     Pelvic Exam:    Perineum: Normal Perineum   Vulva: normal   Vagina:  normal mucosa, normal discharge   pH:    Cervix: closed and long   Adnexa: no mass, fullness, tenderness   Bony Pelvis: android  System: Breast:  normal appearance, no masses or tenderness   Skin: normal coloration and turgor, no rashes    Neurologic: oriented, no focal deficits   Extremities: normal strength, tone, and muscle mass   HEENT extra ocular movement intact   Mouth/Teeth mucous membranes moist, pharynx normal without lesions   Neck supple and no masses   Cardiovascular: regular rate and rhythm   Respiratory:  chest clear, no wheezing, crepitations, rhonchi, normal symmetric air entry   Abdomen: soft, non-tender; bowel sounds normal; no masses,  no  organomegaly   Urinary:       Assessment:    Pregnancy: [id number] Patient Active Problem List   Diagnosis Date Noted  . Short interval between pregnancies complicating pregnancy, antepartum 10/10/2013  . Supervision of high-risk pregnancy 10/10/2013  . History of gestational diabetes in prior pregnancy, currently pregnant 10/10/2013  . DYSLIPIDEMIA 05/07/2010  . HEMORRHOIDS 08/22/2009        Plan:     Initial labs drawn. Prenatal vitamins. Problem list reviewed and updated. Genetic Screening discussed Quad Screen: declined.  Ultrasound discussed; fetal survey: requested. Patient with GDM in last pregnancy- will perform 1 hr GCT today Patient interested in BTL and is willing to start payment plans now  Follow up in 3 weeks. 50% of 30 min visit spent on counseling and coordination of care.     Missy Baksh 10/10/2013

## 2013-10-10 NOTE — Addendum Note (Signed)
Addended by: Franchot Mimes on: 10/10/2013 10:15 AM   Modules accepted: Orders

## 2013-10-11 LAB — PRESCRIPTION MONITORING PROFILE (19 PANEL)
Amphetamine/Meth: NEGATIVE ng/mL
Barbiturate Screen, Urine: NEGATIVE ng/mL
Buprenorphine, Urine: NEGATIVE ng/mL
Cannabinoid Scrn, Ur: NEGATIVE ng/mL
Creatinine, Urine: 190.32 mg/dL (ref >20.0)
Nitrites, Initial: NEGATIVE ug/mL
Opiate Screen, Urine: NEGATIVE ng/mL
Oxycodone Screen, Ur: NEGATIVE ng/mL
Phencyclidine, Ur: NEGATIVE ng/mL
Propoxyphene: NEGATIVE ng/mL
Tapentadol, urine: NEGATIVE ng/mL

## 2013-10-11 LAB — OBSTETRIC PANEL
Antibody Screen: NEGATIVE
Basophils Relative: 0 % (ref 0–1)
Eosinophils Absolute: 0.2 10*3/uL (ref 0.0–0.7)
Eosinophils Relative: 2 % (ref 0–5)
HCT: 34.3 % — ABNORMAL LOW (ref 36.0–46.0)
Hemoglobin: 11.8 g/dL — ABNORMAL LOW (ref 12.0–15.0)
MCH: 28.2 pg (ref 26.0–34.0)
MCHC: 34.4 g/dL (ref 30.0–36.0)
Monocytes Absolute: 0.6 10*3/uL (ref 0.1–1.0)
Monocytes Relative: 8 % (ref 3–12)
Neutrophils Relative %: 69 % (ref 43–77)
Rh Type: POSITIVE

## 2013-10-12 LAB — HEMOGLOBINOPATHY EVALUATION
Hgb A2 Quant: 2.5 % (ref 2.2–3.2)
Hgb F Quant: 0 % (ref 0.0–2.0)

## 2013-10-13 ENCOUNTER — Encounter: Payer: Self-pay | Admitting: *Deleted

## 2013-10-14 ENCOUNTER — Encounter: Payer: Self-pay | Admitting: Obstetrics and Gynecology

## 2013-10-14 DIAGNOSIS — IMO0002 Reserved for concepts with insufficient information to code with codable children: Secondary | ICD-10-CM | POA: Insufficient documentation

## 2013-10-17 ENCOUNTER — Telehealth: Payer: Self-pay | Admitting: *Deleted

## 2013-10-17 NOTE — Telephone Encounter (Signed)
Called patient and left message for patient to call us back for results.

## 2013-10-17 NOTE — Telephone Encounter (Signed)
Message copied by Mannie Stabile on Mon Oct 17, 2013  8:47 AM ------      Message from: Catalina Antigua      Created: Fri Oct 14, 2013 11:59 AM       Please inform patient of abnormal pap and schedule for colposcopy. ------

## 2013-10-21 NOTE — Telephone Encounter (Signed)
Patient not returning calls. Today vm box is full. Will notify her at the next appt.

## 2013-10-27 ENCOUNTER — Telehealth: Payer: Self-pay | Admitting: *Deleted

## 2013-10-27 NOTE — Telephone Encounter (Signed)
Opened in error

## 2013-10-29 ENCOUNTER — Inpatient Hospital Stay (HOSPITAL_COMMUNITY)
Admission: AD | Admit: 2013-10-29 | Discharge: 2013-10-29 | Disposition: A | Discharge status: Home / Self Care | Payer: Self-pay | Source: Ambulatory Visit | Attending: Obstetrics & Gynecology | Admitting: Obstetrics & Gynecology

## 2013-10-29 ENCOUNTER — Encounter (HOSPITAL_COMMUNITY): Payer: Self-pay | Admitting: *Deleted

## 2013-10-29 DIAGNOSIS — IMO0002 Reserved for concepts with insufficient information to code with codable children: Secondary | ICD-10-CM

## 2013-10-29 DIAGNOSIS — R109 Unspecified abdominal pain: Secondary | ICD-10-CM | POA: Insufficient documentation

## 2013-10-29 DIAGNOSIS — O4690 Antepartum hemorrhage, unspecified, unspecified trimester: Secondary | ICD-10-CM

## 2013-10-29 DIAGNOSIS — O4692 Antepartum hemorrhage, unspecified, second trimester: Secondary | ICD-10-CM

## 2013-10-29 DIAGNOSIS — O26859 Spotting complicating pregnancy, unspecified trimester: Secondary | ICD-10-CM | POA: Insufficient documentation

## 2013-10-29 LAB — URINALYSIS, ROUTINE W REFLEX MICROSCOPIC
Hgb urine dipstick: NEGATIVE
Nitrite: NEGATIVE
Protein, ur: NEGATIVE mg/dL
Specific Gravity, Urine: 1.025 (ref 1.005–1.030)
Urobilinogen, UA: 0.2 mg/dL (ref 0.0–1.0)

## 2013-10-29 LAB — URINE MICROSCOPIC-ADD ON

## 2013-10-29 LAB — WET PREP, GENITAL
Trich, Wet Prep: NONE SEEN
Yeast Wet Prep HPF POC: NONE SEEN

## 2013-10-29 NOTE — MAU Note (Signed)
Pt presents with complaints of lower abdominal pain and lower back pain with brownish vaginal bleeding for 3 or 4 days.

## 2013-10-29 NOTE — MAU Note (Signed)
Patient presents with a complaint of brown vaginal bleeding X 3-4 days.

## 2013-10-29 NOTE — MAU Provider Note (Signed)
Chief Complaint: Vaginal Bleeding   First Provider Initiated Contact with Patient 10/29/13 1842     SUBJECTIVE HPI: Kimberly Avila is a 32 y.o. [id number] at [redacted]w[redacted]d by LMP who presents with report of brown spotting noted on toilet paper after voiding several times in the last 4 days. She also has some lower abdominal premenstrual-like cramps.No irritative vaginal discharge. GC and Chlamydia were negative on 10/10/2013. No dysuria, hematuria, frequency or urgency.  Pregnancy Course: PNC at [facility] (followed for A2GDM with P5). ASCUS on PAP. For 1 hr early glucola in 2 days. [place] not yet scheduled. Blood type A pos.   Past Medical History  Diagnosis Date  . No pertinent past medical history   . Gestational diabetes     glyburide   OB History  Gravida Para Term Preterm AB SAB TAB Ectopic Multiple Living  6 5 5       5     # Outcome Date GA Lbr Len/2nd Weight Sex Delivery Anes PTL Lv  6 CUR           5 TRM 02/15/13 [redacted]w[redacted]d 01:35 / 00:10 3.875 kg (8 lb 8.7 oz) M SVD EPI  Y     Comments: WNL  4 TRM 10/28/09 [redacted]w[redacted]d  4.082 kg (9 lb) M SVD   Y  3 TRM 11/25/07 [redacted]w[redacted]d  3.742 kg (8 lb 4 oz) M SVD   Y  2 TRM 09/25/04 [redacted]w[redacted]d  3.175 kg (7 lb) M SVD   Y  1 TRM 02/21/02 [redacted]w[redacted]d  2.892 kg (6 lb 6 oz) F SVD   Y     Past Surgical History  Procedure Laterality Date  . No past surgeries     History   Social History  . Marital Status: Married    Spouse Name: N/A    Number of Children: N/A  . Years of Education: N/A   Occupational History  . Not on file.   Social History Main Topics  . Smoking status: Never Smoker   . Smokeless tobacco: Never Used  . Alcohol Use: No  . Drug Use: No  . Sexual Activity: Yes    Birth Control/ Protection: None     Comment: wants birth control unable to pay for tubal   Other Topics Concern  . Not on file   Social History Narrative   ** Merged History Encounter **       No current facility-administered medications on file prior to encounter.   No current  outpatient prescriptions on file prior to encounter.   Allergies  Allergen Reactions  . Gelatin Itching  . Nyquil [Pseudoeph-Doxylamine-Dm-Apap] Itching  . Tylenol [Acetaminophen] Itching    ROS: Pertinent items in HPI  OBJECTIVE Blood pressure 111/68, pulse 78, temperature 98.2 F (36.8 C), temperature source Oral, resp. rate 16, height 5\' 2"  (1.575 m), weight 74.844 kg (165 lb), last menstrual period 06/21/2013. GENERAL: Well-developed, well-nourished female in no acute distress.  HEENT: Normocephalic HEART: normal rate RESP: normal effort ABDOMEN: Soft, non-tender. DT FHR 150 EXTREMITIES: Nontender, no edema NEURO: Alert and oriented SPECULUM EXAM: NEFG, physiologic discharge, no blood noted, cervix clean BIMANUAL: cervix L/C/H  LAB RESULTS Results for orders placed during the hospital encounter of 10/29/13 (from the past 24 hour(s))  URINALYSIS, ROUTINE W REFLEX MICROSCOPIC     Status: Abnormal   Collection Time    10/29/13  6:20 PM      Result Value Range   Color, Urine YELLOW  YELLOW   APPearance CLEAR  CLEAR  Specific Gravity, Urine 1.025  1.005 - 1.030   pH 6.0  5.0 - 8.0   Glucose, UA NEGATIVE  NEGATIVE mg/dL   Hgb urine dipstick NEGATIVE  NEGATIVE   Bilirubin Urine NEGATIVE  NEGATIVE   Ketones, ur NEGATIVE  NEGATIVE mg/dL   Protein, ur NEGATIVE  NEGATIVE mg/dL   Urobilinogen, UA 0.2  0.0 - 1.0 mg/dL   Nitrite NEGATIVE  NEGATIVE   Leukocytes, UA SMALL (*) NEGATIVE  URINE MICROSCOPIC-ADD ON     Status: Abnormal   Collection Time    10/29/13  6:20 PM      Result Value Range   Squamous Epithelial / LPF FEW (*) RARE   WBC, UA 7-10  <3 WBC/hpf   RBC / HPF 0-2  <3 RBC/hpf   Bacteria, UA FEW (*) RARE  WET PREP, GENITAL     Status: Abnormal   Collection Time    10/29/13  6:47 PM      Result Value Range   Yeast Wet Prep HPF POC NONE SEEN  NONE SEEN   Trich, Wet Prep NONE SEEN  NONE SEEN   Clue Cells Wet Prep HPF POC NONE SEEN  NONE SEEN   WBC, Wet Prep HPF  POC MANY (*) NONE SEEN    IMAGING No results found.  MAU COURSE  ASSESSMENT 1. Vaginal bleeding in pregnancy, second trimester   2. ASCUS with positive high risk HPV   [id number] at [redacted]w[redacted]d with no evidence of vaginal bleeding found  PLAN Discharge home. [place] will be scheduled at [facility] visit    Medication List    Notice   You have not been prescribed any medications.     Follow-up Information   Follow up with [facility] In 3 days.   Specialty:  Obstetrics and Gynecology   Contact information:   [address] [place] [address] [phone]       Danae Orleans, CNM 10/29/2013  6:51 PM

## 2013-10-31 ENCOUNTER — Ambulatory Visit (INDEPENDENT_AMBULATORY_CARE_PROVIDER_SITE_OTHER): Payer: Self-pay | Admitting: Family Medicine

## 2013-10-31 ENCOUNTER — Other Ambulatory Visit (HOSPITAL_COMMUNITY)
Admission: RE | Admit: 2013-10-31 | Discharge: 2013-10-31 | Disposition: A | Discharge status: Home / Self Care | Payer: Medicaid Other | Source: Ambulatory Visit | Attending: Family Medicine | Admitting: Family Medicine

## 2013-10-31 VITALS — BP 107/74 | Temp 97.7°F | Wt 161.0 lb

## 2013-10-31 DIAGNOSIS — O0992 Supervision of high risk pregnancy, unspecified, second trimester: Secondary | ICD-10-CM

## 2013-10-31 DIAGNOSIS — R6889 Other general symptoms and signs: Secondary | ICD-10-CM

## 2013-10-31 DIAGNOSIS — N72 Inflammatory disease of cervix uteri: Secondary | ICD-10-CM | POA: Insufficient documentation

## 2013-10-31 DIAGNOSIS — O09299 Supervision of pregnancy with other poor reproductive or obstetric history, unspecified trimester: Secondary | ICD-10-CM

## 2013-10-31 DIAGNOSIS — O09292 Supervision of pregnancy with other poor reproductive or obstetric history, second trimester: Secondary | ICD-10-CM

## 2013-10-31 DIAGNOSIS — IMO0002 Reserved for concepts with insufficient information to code with codable children: Secondary | ICD-10-CM

## 2013-10-31 LAB — POCT URINALYSIS DIP (DEVICE)
Nitrite: NEGATIVE
Protein, ur: NEGATIVE mg/dL
Urobilinogen, UA: 1 mg/dL (ref 0.0–1.0)
pH: 7 (ref 5.0–8.0)

## 2013-10-31 LAB — OB RESULTS CONSOLE GBS: GBS: POSITIVE

## 2013-10-31 NOTE — Progress Notes (Signed)
Pulse 73 Needs colpo today. Had some bleeding last week. None today. Patient reports that she fell 10/14 and doesn't feel baby move much since that time. States that she was feeling movement prior to falling.

## 2013-10-31 NOTE — Progress Notes (Signed)
For colpo today Schedule anatomy Needs early 1 hour Check urine culture  Pt. Is an 32 y.o. [id number] who presents for follow-up following an abnormal pap smear which showed ASCUS  + HR HPV.   Patient given informed consent, signed copy in the chart, time out was performed.  Placed in lithotomy position. Cervix viewed with speculum and colposcope after application of acetic acid.   Colposcopy adequate?  yes Acetowhite lesions?yes, dense entire SCJ Punctation?no Mosaicism?  no Abnormal vasculature?  no Biopsies?11 and 3  ECC?not done-pregnant

## 2013-10-31 NOTE — Patient Instructions (Addendum)
Colposcopa (Colposcopy) La colposcopa es un procedimiento para examinar el cuello del tero y la vagina o la zona externa alrededor de la vagina, para buscar signos de enfermedad o anormalidades. Para realizar este procedimiento se utiliza un microscopio con luz, llamado colposcopio. Durante el procedimiento podrn tomarle muestras de tejido, en caso que el profesional encuentre clulas anormales. La colposcopa se indica si la mujer tiene:  Papanicolau anormal. El Papanicolaou es un examen mdico que se realiza para Development worker, community las clulas que estn en la superficie del cuello uterino.  Un resultado de Pap que podra indicar la presencia del virus del Geneticist, molecular (VPH). El virus puede producir verrugas genitales y est relacionado con el desarrollo de cncer cervical.  Una lcera en el cuello del tero y el resultado del Pap fue normal.  Se han observado verrugas genitales en el cuello del tero o en la zona externa de la vagina.  Una madre que ha consumido dietilstilbestrol (DES) durante el Psychiatrist.  Relaciones sexuales dolorosas.  Hemorragias vaginales, especialmente despus de Sales promotion account executive. INFORME A SU MDICO:  Cualquier alergia que tenga.  Todos los [employer] Energy [place], incluyendo vitaminas, hierbas, gotas oftlmicas, cremas y [address] de [address].  Problemas previos que usted o los [employer] de su familia hayan tenido con el uso de anestsicos.  Enfermedades de [employer].  Cirugas previas.  Padecimientos mdicos. RIESGOS Y COMPLICACIONES En general, la colposcopa es un procedimiento seguro. Sin embargo, Tree surgeon procedimiento, pueden surgir complicaciones. Las complicaciones posibles son:  Hemorragias.  Infeccin.  Lesiones que no se detectan. ANTES DEL PROCEDIMIENTO   Informe al mdico si tiene el perodo menstrual. En general, la colposcopa no se realiza Environmental consultant.  Durante las 24 horas previas a la colposcopa  no debe:  Child psychotherapist.  Usar tampones.  Aplicarse medicamentos, cremas o supositorios en la vagina.  Tener [employer] [place] [employer]. PROCEDIMIENTO  Durante el procedimiento, estar acostada sobre la espalda con los pies en los soportes (estribos). Le colocarn el la vagina un instrumento metlico o plstico entibiado espculo) para [place] y permitir al profesional visualizar el cuello del tero. El colposcopio se coloca fuera de la vagina. Este instrumento se [place] para ampliar y examinar el cuello del tero, la vagina y la zona externa de la misma. Se aplica una pequea cantidad de solucin lquida en la zona a observar. Este lquido facilita la observacin de clulas anormales. El mdico utilizar instrumentos para aspirar la mucosidad y las clulas del canal del cuello del tero. Luego registrar la ubicacin de las reas anormales. Si le hacen una biopsia durante el procedimiento, le aplicarn un medicamento para adormecer la zona (anestsico local). Podr sentir Herbalist o clicos leves mientras le hacen la biopsia. Despus del procedimiento, las muestras de tejido Cytogeneticist durante la biopsia se enviarn al laboratorio para ser [employer]. DESPUS DEL PROCEDIMIENTO  Le darn instrucciones para que concurra al control con su mdico para recibir los [employer]. Es importante que cumpla con todas las visitas. Document Released: 12/08/2005 Document Revised: 08/10/2013 [facility] Patient Information 2014 [place], [place].  Segundo trimestre del Psychiatrist  (Second Trimester of Pregnancy) El segundo trimestre del Psychiatrist se extiende desde la semana 13 hasta la semana 28, del 4 al 6 mes. En general, es el momento del embarazo en el que se sentir mejor. Su organismo se ha adaptado a Charity fundraiser y comienza a Diplomatic Services operational officer. En general las nuseas matutinas han disminuido o han desaparecido completamente. El [place]  trimestre es tambin la  poca en la que el feto se desarrolla rpidamente. Hacia el final del sexto mes, el beb mide aproximadamente 9 pulgadas (23 cm) y pesa alrededor de 1  libras (700 g). Es probable que Architectural technologist al beb (dar pataditas) entre las 18 y 20 semanas del Psychiatrist.  CAMBIOS CORPORALES  Su organismo atravesar numerosos cambios durante el [place]. Los cambios varan de una mujer a Educational psychologist.   Seguir [employer] Standard Companies. Notar que la parte baja del abdomen se ensancha.  Podrn aparecer las primeras estras en las caderas, abdomen y [place].  Es posible que sienta cefaleas, que se pueden Paramedic con los medicamentos que su mdico le autorice a Chemical engineer.  Tendr necesidad de orinar con ms frecuencia porque el feto est presionando en la vejiga.  Como consecuencia del Psychiatrist, podr sentir Actor.  Podr estar constipada ya que ciertas hormonas hacen que los msculos que [employer] desechos a travs de los intestinos trabajen ms lentamente.  Pueden aparecer hemorroides o abultarse las venas (venas varicosas).  El dolor de espalda se debe al aumento de peso y a que las hormonas del Management consultant las articulaciones entre los huesos de la pelvis y a la modificacin del peso y a los msculos que soportan el equilibrio.  Sus mamas seguirn desarrollndose y estarn ms sensibles.  Las Veterinary surgeon y estar sensibles al cepillado y al hilo dental.  Pueden aparecer en el rostro zonas oscuras o manchas ([place], mscara del [place]). Esto desaparece despus del nacimiento del beb.  Es posible que se formeuna lnea oscura desde el ombligo a la zona del pubis (linea nigra). Esto desaparece despus del nacimiento del beb. QU DEBE ESPERAR EN LAS CONSULTAS PRENATALES  Durante una visita prenatal de rutina:   La pesarn para verificar que usted y el feto se encuentran dentro de los lmites normales.  Le tomarn la presin arterial.  Le medirn el abdomen para  verificar el desarrollo del beb.  Escucharn los latidos fetales.  Se evaluarn los resultados de los estudios realizados en visitas anteriores. El mdico le preguntar:   Cmo se siente.  Si siente los movimientos del beb.  Si tiene sntomas anormales, como prdida de lquido, [place], dolores de cabeza intensos o clicos abdominales.  Si tiene [place] duda. Otros estudios que podrn realizarse durante el segundo trimestre son:   Anlisis de sangre para evaluar:  Niveles bajos de hierro (anemia).  Diabetes gestacional (entre las 24 y las 28 [address]).  Anticuerpos Rh.  Anlisis de orina para detectar infecciones, diabetes o protenas en la orina.  Una ecografa para confirmar si el crecimiento y el desarrollo del beb son los [place].  Una amniocentesis para diagnosticar posibles problemas genticos.  Estudios del feto para descartar espina bfida y sndrome de [place]. INSTRUCCIONES PARA EL CUIDADO EN EL HOGAR   Evite fumar, consumir hierbas, beber alcohol y Chemical engineer frmacos que no ne hayan recetado. Estas sustancias qumicas afectan la formacin y el desarrollo del beb.  Siga las indicaciones del profesional con respecto a Adult nurse. Durante el embarazo, hay medicamentos que son seguros y otros no lo son.  Realice actividad fsica slo segn las indicaciones del mdico. Sentir clicos uterinos es el mejor signo para Restaurant manager, fast food actividad fsica.  Contine haciendo comidas regulares y sanas.  Use un sostn que le brinde buen soporte si sus mamas estn sensibles.  No utilice la baera con agua caliente, baos turcos y saunas.  Colquese el cinturn  de seguridad cuando conduzca.  Evite comer carne cruda y el contacto con los utensilios y desperdicios de los gatos. Estos elementos contienen grmenes que pueden causar defectos de nacimiento en el beb.  Tome las vitaminas indicadas para la etapa prenatal.  Pruebe un laxante (si el mdico la  autoriza) si tiene constipacin. Consuma ms alimentos ricos en fibra, como vegetales y frutas frescos y cereales enteros. Beba gran cantidad de lquido para mantener la orina de tono claro o amarillo plido.  Tome baos de agua tibia para Primary school teacher o las molestias causadas por las hemorroides. Use una crema para las hemorroides si el mdico la autoriza.  Si tiene venas varicosas, use medias de soporte. Eleve los pies durante 15 minutos, 3 o 4 veces por da. Limite el consumo de sal en su dieta.  Evite levantar objetos pesados, use zapatos de tacones bajos y [place].  Descanse con las piernas elevadas si tiene calambres o dolor de cintura.  Visite a su dentista si no lo ha Occupational hygienist. Use un cepillo de dientes blando para higienizarse los dientes y use suavemente el hilo dental.  Puede continuar su vida sexual siempre que el mdico la autorice.  Concurra a todas las visitas prenatales segn las indicaciones de su mdico. SOLICITE ATENCIN MDICA SI:   [place].  Siente clicos leves, presin en la pelvis o dolor persistente en el abdomen.  Tiene nuseas o vmitos o diarrea persistentes.  Brett Fairy secrecin vaginal con mal olor.  Siente dolor al [employer]oco[employer]. SOLICITE ATENCIN MDICA DE INMEDIATO SI:   Tiene fiebre.  Pierde lquido por la vagina.  Tiene sangrando o pequeas prdidas vaginales.  Siente dolor intenso o clicos en el abdomen.  Sube o baja de peso rpidamente.  Tiene dificultad para respirar y Geographical information systems officer.  Sbitamente se le hincha el rostro, las manos, los tobillos, los pies o las piernas de [place].  No ha sentido los movimientos del beb durante Georgianne Fick.  Siente un dolor de cabeza intenso que no se alivia con medicamentos.  Su visin se modifica. Document Released: 09/17/2005 Document Revised: 08/10/2013 [facility] Patient Information 2014 [place], [place].  Lactancia materna  (Breastfeeding)  El  cambio hormonal durante el Psychiatrist produce el desarrollo del tejido [place] y un aumento en el nmero y tamao de los conductos galactforos. La hormona prolactina permite que las protenas, los azcares y las grasas de la sangre produzcan la [employer] materna en las glndulas productoras de [place]. La hormona progesterona impide que la leche materna sea liberada antes del nacimiento del beb. Despus del nacimiento del beb, su nivel de progesterona disminuye permitiendo que la leche materna sea [place]. Pensar en el beb, as como la succin o Theatre manager, pueden estimular la liberacin de [place] de las glndulas productoras de [place].  La decisin de Company secretary) es una de las mejores opciones que usted puede hacer para usted y su beb. La informacin que sigue da una breve resea de los beneficios, as Lexicographer que debe saber sobre la [place].  LOS BENEFICIOS DE AMAMANTAR  Para el beb   La primera leche (calostro) ayuda al mejor funcionamiento del sistema digestivo del beb.   La leche tiene anticuerpos que provienen de la madre y que ayudan a prevenir las infecciones en el beb.   El beb tiene una menor incidencia de asma, alergias y del sndrome de muerte sbita del lactante (SMSL).   Los nutrientes de la  leche materna son mejores para el beb que la [place].  La leche materna mejora el desarrollo cerebral del beb.   Su beb tendr menos gases, clicos y estreimiento.  Es menos probable que el beb desarrolle otras enfermedades, como obesidad infantil, asma o diabetes mellitus. Para usted   La lactancia materna favorece el desarrollo de un vnculo muy especial entre la madre y el beb.   Es ms conveniente, siempre disponible, a la [place] y [place].   La lactancia materna ayuda a quemar caloras y a perder el peso ganado durante el [place].   Hace que el tero se contraiga ms rpidamente a su tamao normal y  Consolidated Edison sangrado despus del [place].   Las [employer] Holdings que amamantan tienen menos riesgo de Environmental education officer osteoporosis o cncer de mama o de ovario en el futuro.  FRECUENCIA DEL AMAMANTAMIENTO   Un beb sano, nacido a trmino, puede amamantarse con tanta frecuencia como cada hora, o espaciar las comidas cada tres horas. La frecuencia en la lactancia varan de un beb a otro.   Los recin nacidos deben ser alimentados por lo menos cada 2-3 horas Administrator y cada 4-5 horas durante la noche. Usted debe amamantarlo un mnimo de 8 tomas en un perodo de 24 horas.  Despierte al beb para amamantarlo si han pasado 3-4 horas desde la ltima comida.  Amamante cuando sienta la necesidad de reducir la plenitud de sus senos o cuando el beb muestre signos de [place]. Las seales de que el beb puede Gentry Fitz son:  Lenora Boys su estado de alerta o vigilancia.  Se estira.  Mueve la cabeza de un lado a otro.  Mueve la cabeza y abre la boca cuando se le toca la mejilla o la boca (reflejo de succin).  Aumenta las vocalizaciones, tales como sonidos de succin, relamerse los labios, arrullos, suspiros, o chirridos.  Mueve la [employer] boca.  Se chupa con ganas los dedos o las manos.  Agitacin.  Llanto intermitente.  Los signos de hambre extrema requerirn que lo calme y lo consuele antes de tratar de alimentarlo. Los signos de hambre extrema son:  Agitacin.  Llanto fuerte e intenso.  Gritos.  El amamantamiento frecuente la ayudar a producir ms [place] y a Education officer, community de Engineer, mining en los pezones e hinchazn de las [place].  LACTANCIA MATERNA   Ya sea que se encuentre acostada o sentada, asegrese que el abdomen del beb est enfrente el suyo.   Sostenga la mama con el pulgar por arriba y los otros 4 dedos por debajo del pezn. Asegrese que sus dedos se encuentren lejos del pezn y de la boca del beb.   Empuje suavemente los labios del beb con el pezn o con el dedo.    Cuando la boca del beb se abra lo suficiente, introduzca el pezn y la zona oscura que lo rodea (areola) tanto como le sea posible dentro de la boca.  Debe haber ms areola visible por arriba del labio superior que por debajo del labio inferior.  La lengua del beb debe estar entre la enca inferior y el seno.  Asegrese de que la boca del beb est en la posicin correcta alrededor del pezn (prendida). Los labios del beb deben crear un sello sobre su pecho.  Las seales de que el beb se ha prendido eficazmente al pezn son:  Payton Doughty o succiona sin dolor.  Se escucha que traga Lyondell [employer].  No hace ruidos ni chasquidos.  Hay  movimientos musculares por arriba y por delante de sus odos al Printmaker.  El beb debe succionar unos 2-3 minutos para que salga la [place]. Permita que el nio se alimente en cada mama todo lo que desee. Alimente al beb hasta que se desprenda o se quede dormido en Freight forwarder y luego ofrzcale el segundo pecho.  Las seales de que el beb est lleno y satisfecho son:  Disminuye gradualmente el nmero de succiones o no succiona.  Se queda dormido.  Extiende o relaja su cuerpo.  Retiene una pequea cantidad de [facility] boca.  Se desprende del pecho por s mismo.  Los signos de una lactancia materna eficaz son:  Los senos han aumentado la firmeza, el peso y el tamao antes de la alimentacin.  Son ms blandos despus de amamantar.  Un aumento del volumen de [place], y tambin el cambio de su consistencia y color se producen hacia el quinto da de Tour manager.  La congestin mamaria se [place] al dar de [place].  Los pezones no duelen, ni estn agrietados ni sangran.  De ser necesario, interrumpa la succin poniendo su dedo en la esquina de la boca del beb y deslizando el dedo entre sus encas. A continuacin, retire la mama de su boca.  Es comn que los bebs regurgiten un poco despus de comer.  A menudo los bebs tragan  aire al alimentarse. Esto puede hacer que se sienta molesto. Hacer eructar al beb al P[place] Plate de pecho puede ser de [place].  Se recomiendan suplementos de vitamina D para los bebs que reciben slo [address].  Evite el uso del chupete durante las primeras 4 a 6 semanas de vida.  Evite la alimentacin suplementaria con agua, frmula o jugo en lugar de la [employer] Pal[employer]. La leche materna es todo el alimento que el beb necesita. No es necesario que el nio ingiera agua o preparados de bibern. Sus pechos producirn ms leche si se evita la alimentacin suplementaria durante las primeras semanas. COMO SABER SI EL BEB OBTIENE LA SUFICIENTE LECHE MATERNA  Preguntarse si el beb obtiene la cantidad suficiente de [place] es una preocupacin frecuente [employer]. Puede asegurarse que el beb tiene la leche suficiente si:   El beb succiona activamente y usted escucha que traga.   El beb parece estar relajado y satisfecho despus de Psychologist, clinical.   El nio se alimenta al menos 8 a 12 veces en 24 horas.  Durante los primeros 3 a 5 das de vida:  Moja 3-5 paales en 24 horas. La materia fecal debe ser blanda y [place].  Tiene al menos 3 a 4 deposiciones en 24 horas. La materia fecal debe ser blanda y [place].  A los 5-7 das de vida, el beb debe tener al menos 3-6 deposiciones en 24 horas. La materia fecal debe ser grumosa y [place] a los 5 [address] de [place].  Su beb tiene una prdida de Psychologist, counselling a 7al 10% durante los primeros 3 [address]  [address] de [address]  El beb no pierde peso despus de 3-7 [address] de [address]  El beb debe aumentar 4 a 6 libras (120 a 170 gr.) por semana despus de los 4 [address] de vida.  Aumenta de [place] a los [address] de vida y vuelve al peso del nacimiento dentro de las 2 semanas. CONGESTIN MAMARIA  Durante la primera semana despus del [place], usted puede experimentar hinchazn en las mamas (congestin [place]). Al estar congestionadas, las mamas se sienten pesadas, calientes o  sensibles al tacto. El pico de la congestin ocurre a las 24 -48 horas despus del parto.   La congestin puede disminuirse:  Continuando con la Tour manager.  Aumentando la frecuencia.  Tomando duchas calientes o aplicando calor hmedo en los senos antes de cada comida. Esto aumenta la circulacin y [place] a que la [place].   Masajeando suavemente el pecho antes y [employer]. Con las yemas de los dedos, masajee la pared del pecho hacia el pezn en un movimiento circular.   Asegurarse de que el beb vaca al menos uno de sus pechos en cada alimentacin. Tambin ayuda si comienza la siguiente toma en el otro seno.   Extraiga manualmente o con un sacaleches las mamas para vaciar los pechos si el beb tiene sueo o no se aliment bien. Tambin puede extraer la [employer] Resources cuando vuelva a trabajar o si siente que se estn congestionando las [place].  Asegrese de que el beb se prende y est bien colocado durante la Market researcher. Si sigue estas indicaciones, la congestin debe mejorar en 24 a 48 horas. Si an tiene dificultades, consulte a Barista.  CUDESE USTED MISMA  Cuide sus mamas.   Bese o dchese diariamente.   Evite usar [employer].   Use un sostn de soporte Evite el uso de sostenes con aro.  Seque al aire sus pezones durante 3-4 minutos despus de cada comida.   Utilice slo apsitos de algodn en el sostn para absorber las prdidas de [place]. La prdida de un poco de Deere & Company las comidas es normal.   Use solamente lanolina pura en sus pezones despus de Museum/gallery exhibitions officer. Usted no tiene que lavarla antes de alimentar al beb. Otra opcin es sacarse unas gotas de [place] y [employer] Holdings pezones.  Continuar con los autocontroles de la mama. Cudese.   Consuma alimentos saludables. Alterne 3 comidas con 3 colaciones.  Evite los alimentos que usted nota que perjudican al beb.  Dixie Dials, jugos de fruta y agua para  Patent examiner su sed (aproximadamente 8 vasos al Futures trader).   Descanse con frecuencia, reljese y tome sus vitaminas prenatales para evitar la fatiga, el estrs y la anemia.  Evite masticar y fumar tabaco.  Evite el consumo de alcohol y drogas.  Tome medicamentos de venta libre y recetados tal como le indic su mdico o Social research officer, government. Siempre debe consultar con su mdico o farmacutico antes de tomar cualquier medicamento, vitamina o suplemento de hierbas.  Sepa que durante la lactancia puede quedar embarazada. Si lo desea, hable con su mdico acerca de la planificacin familiar y los mtodos anticonceptivos seguros que puede utilizar durante la Market researcher. SOLICITE ATENCIN MDICA SI:   Usted siente que quiere dejar de Museum/gallery exhibitions officer o se siente frustrada con la lactancia.  Siente dolor en los senos o en los pezones.  Sus pezones estn agrietados o Water quality scientist.  Sus pechos estn irritados, sensibles o calientes.  Tiene un rea hinchada en cualquiera de los senos.  Siente escalofros o fiebre.  Tiene nuseas o vmitos.  Observa un drenaje en los pezones.  Sus mamas no se llenan antes de Marine scientist al 5to da despus del [place].  Se siente triste y deprimida.  El nio est demasiado somnoliento como para comer.  El nio tiene problemas para Industrial/product designer.   Moja menos de 3 paales en 24 horas.  Mueve el intestino menos de 3 veces en 24 horas.  La piel del beb o la parte blanca de sus ojos  est ms amarilla.   El beb no ha aumentado de [place] a los [address] de [place]. ASEGRESE DE QUE:   Comprende estas instrucciones.  Controlar su enfermedad.  Solicitar ayuda de inmediato si no mejora o si empeora. Document Released: 12/08/2005 Document Revised: 09/01/2012 [facility] Patient Information 2014 [place], [place].

## 2013-11-01 ENCOUNTER — Encounter: Payer: Self-pay | Admitting: Family Medicine

## 2013-11-01 LAB — URINE CULTURE: Colony Count: 9000

## 2013-11-02 ENCOUNTER — Other Ambulatory Visit: Payer: Self-pay

## 2013-11-02 MED ORDER — FLUCONAZOLE 150 MG PO TABS: 150.0000 mg | ORAL_TABLET | Freq: Once | ORAL | Status: DC

## 2013-11-02 NOTE — Telephone Encounter (Signed)
Julie,Spanish interpreter,  called and stated that the pt since having colpo is having a discharge with itchiness and burning.  I confirmed with patient that it is not with urination.  Pt stated that it is not with urination.  I advised pt that we would prescribe diflucan and if she does not get any relief to please call the front desk and schedule an appt for vaginal discharge. Pt stated understanding.

## 2013-11-04 ENCOUNTER — Other Ambulatory Visit: Payer: Self-pay

## 2013-11-04 ENCOUNTER — Encounter: Payer: Self-pay | Admitting: Family Medicine

## 2013-11-04 DIAGNOSIS — O2340 Unspecified infection of urinary tract in pregnancy, unspecified trimester: Secondary | ICD-10-CM | POA: Insufficient documentation

## 2013-11-04 DIAGNOSIS — B951 Streptococcus, group B, as the cause of diseases classified elsewhere: Secondary | ICD-10-CM | POA: Insufficient documentation

## 2013-11-05 LAB — CULTURE, OB URINE: Organism ID, Bacteria: 25000

## 2013-11-08 ENCOUNTER — Telehealth: Payer: Self-pay | Admitting: *Deleted

## 2013-11-08 ENCOUNTER — Ambulatory Visit (HOSPITAL_COMMUNITY): Admission: RE | Admit: 2013-11-08 | Payer: Medicaid Other | Source: Ambulatory Visit

## 2013-11-08 ENCOUNTER — Ambulatory Visit (HOSPITAL_COMMUNITY)
Admission: RE | Admit: 2013-11-08 | Discharge: 2013-11-08 | Disposition: A | Discharge status: Home / Self Care | Payer: Medicaid Other | Source: Ambulatory Visit | Attending: Family Medicine | Admitting: Family Medicine

## 2013-11-08 DIAGNOSIS — O0992 Supervision of high risk pregnancy, unspecified, second trimester: Secondary | ICD-10-CM

## 2013-11-08 DIAGNOSIS — O09292 Supervision of pregnancy with other poor reproductive or obstetric history, second trimester: Secondary | ICD-10-CM

## 2013-11-08 DIAGNOSIS — O358XX0 Maternal care for other (suspected) fetal abnormality and damage, not applicable or unspecified: Secondary | ICD-10-CM | POA: Insufficient documentation

## 2013-11-08 DIAGNOSIS — N390 Urinary tract infection, site not specified: Secondary | ICD-10-CM

## 2013-11-08 DIAGNOSIS — Z363 Encounter for antenatal screening for malformations: Secondary | ICD-10-CM | POA: Insufficient documentation

## 2013-11-08 DIAGNOSIS — Z1389 Encounter for screening for other disorder: Secondary | ICD-10-CM | POA: Insufficient documentation

## 2013-11-08 DIAGNOSIS — O99810 Abnormal glucose complicating pregnancy: Secondary | ICD-10-CM | POA: Insufficient documentation

## 2013-11-08 DIAGNOSIS — B951 Streptococcus, group B, as the cause of diseases classified elsewhere: Secondary | ICD-10-CM

## 2013-11-08 DIAGNOSIS — O09892 Supervision of other high risk pregnancies, second trimester: Secondary | ICD-10-CM

## 2013-11-08 MED ORDER — AMPICILLIN 500 MG PO CAPS: 500.0000 mg | ORAL_CAPSULE | Freq: Three times a day (TID) | ORAL | Status: AC

## 2013-11-08 NOTE — Telephone Encounter (Signed)
Called patient and her husband was on the way home and asked if we would call back soon.

## 2013-11-08 NOTE — Telephone Encounter (Signed)
Message copied by Mannie Stabile on Tue Nov 08, 2013  9:40 AM ------      Message from: Reva Bores      Created: Fri Nov 04, 2013  8:09 AM       Needs Ampicillin 500 mg tid x 7 d # 21 ------

## 2013-11-08 NOTE — Telephone Encounter (Signed)
Pt informed

## 2013-11-09 ENCOUNTER — Encounter: Payer: Self-pay | Admitting: Family Medicine

## 2013-11-21 ENCOUNTER — Encounter: Payer: Self-pay | Admitting: *Deleted

## 2013-11-28 ENCOUNTER — Encounter: Payer: Self-pay | Admitting: Obstetrics and Gynecology

## 2013-12-02 ENCOUNTER — Encounter: Payer: Self-pay | Admitting: *Deleted

## 2013-12-12 ENCOUNTER — Encounter: Payer: Self-pay | Admitting: Obstetrics and Gynecology

## 2013-12-12 ENCOUNTER — Ambulatory Visit (INDEPENDENT_AMBULATORY_CARE_PROVIDER_SITE_OTHER): Payer: Self-pay | Admitting: Obstetrics and Gynecology

## 2013-12-12 VITALS — BP 107/69 | Temp 96.7°F | Wt 165.9 lb

## 2013-12-12 DIAGNOSIS — O09292 Supervision of pregnancy with other poor reproductive or obstetric history, second trimester: Secondary | ICD-10-CM

## 2013-12-12 DIAGNOSIS — O09299 Supervision of pregnancy with other poor reproductive or obstetric history, unspecified trimester: Secondary | ICD-10-CM

## 2013-12-12 LAB — GLUCOSE TOLERANCE, 1 HOUR (50G) W/O FASTING: Glucose, 1 Hour GTT: 181 mg/dL — ABNORMAL HIGH (ref 70–140)

## 2013-12-12 LAB — POCT URINALYSIS DIP (DEVICE)
Bilirubin Urine: NEGATIVE
Ketones, ur: NEGATIVE mg/dL
Specific Gravity, Urine: 1.02 (ref 1.005–1.030)
pH: 6 (ref 5.0–8.0)

## 2013-12-12 NOTE — Progress Notes (Signed)
P= 79 C/o of a little bit of pain bilateral upper quadrants of abdomen this morning but it has since subsided.  Early 1hr gtt today.

## 2013-12-12 NOTE — Addendum Note (Signed)
Addended by: Franchot Mimes on: 12/12/2013 09:47 AM   Modules accepted: Orders

## 2013-12-12 NOTE — Patient Instructions (Signed)
Gua de planeamiento de la alimentacin para diabticos (Diabetes Meal Planning Guide) La gua de planeamiento de alimentacin para diabticos es una herramienta para ayudarlo a planear sus comidas y colaciones. Es importante para las personas con diabetes controlar sus niveles de azcar. Elegir los alimentos correctos y las cantidades adecuadas durante el da le ayudar a controlar el azcar en sangre. Comer bien puede incluso ayudarlo a mejorar la presin sangunea y alcanzar o mantener un peso saludable. CUENTE LOS HIDRATOS DE CARBONO CON FACILIDAD Cuando consume hidratos de carbono, stos se transforman en azcar (glucosa). Esto a su vez aumenta el nivel de azcar en sangre. El conteo de carbohidratos puede ayudarlo a controlar este nivel para que se sienta mejor. Al planear sus alimentos con el conteo de carbohidratos, podr tener ms flexibilidad en lo que come y equilibrar la medicacin con el consumo de alimentos. El conteo de carbohidratos significa simplemente sumar la cantidad total de gramos de carbohidratos a sus comidas o colaciones. Trate de consumir la misma cantidad en cada comida. A continuacin encontrar una lista de 1 porcin o 15 gr. de carbohidratos. A continuacin se enumeran. Pregunte al mdico cuntos gramos de carbohidratos necesita comer en cada comida o colacin. Almidones y granos  1 rebanada de pan.   bollo ingls o bollo para hamburguesa o hotdog.   taza de cereal fro (sin azcar).   taza de pasta o arroz cocido.   taza de vegetales que contengan almidn (maz, papas, arvejas, porotos, calabaza).  1 omelette (6 pulgadas).   bollo.  1 waffle o panqueque (del tamao de un CD).   taza de cereales cocidos.  4 a 6 galletas saldas pequeas. *Se recomienda el consumo de granos enteros. Frutas  1 taza de frutos rojos, meln, papaya o anan sin azcar.  1 fruta fresca pequea.   banana o mango.   taza de jugo de frutas (4 onzas sin endulzar).    taza de fruta envasada en jugo natural o agua.  2 cucharadas de frutas secas.  12-15 uvas o cerezas. Leche y yogurt  1 taza de leche descremada o al 1%.  1 taza de leche de soja.  6 onzas de yogurt descremado con edulcorante sin azcar.  6 onzas de yogur descremado de soja.  6 onzas de yogur natural. Vegetales  1 taza de vegetales crudos o  de vegetales cocidos se considera cero carbohidratos o una comida "libre".  Si come 3 o ms porciones en una comida, cuntelas como 1 porcin de carbohidratos. Otros carbohidratos   onzas de chips o pretzels.   taza de helado de crema o yogur helado.   taza de helado de agua.  5 cm de torta no congelada.  1 cucharada de miel, azcar, mermelada, jalea o almbar.  2 galletitas dulces pequeas.  3 cuadrados de crackers de graham.  3 tazas de palomitas de maz.  6 crackers.  1 taza de caldo.  Cuente 1 taza de guisado u otra mezcla de alimentos como 2 porciones de carbohidratos.  Los alimentos con menos de 20 caloras por porcin deben contarse como cero carbohidratos o alimento "libre". Si lo desea compre un libro o software de computacin que enumere la cantidad de gramos de carbohidratos de los diferentes alimentos. Adems, el panel nutricional en las etiquetas de los productos que consume es una buena fuente de informacin. Le indicar el tamao de la porcin y la cantidad total de carbohidratos que consumir por cada una. Divida este nmero por 15 para obtener el nmero   de conteo de carbohidratos por porcin. Recuerde: cada porcin son 15 gramos de carbohidratos. PORCIONES La medicin de los alimentos y el tamao de las porciones lo ayudarn a controlar la cantidad exacta de comida que debe ingerir. La lista que sigue le mostrar el tamao de algunas porciones comunes.   1 onza.................4 dados apilados.  3 onzas...............Un mazo de cartas.  1 cucharadita.....La punta de un dedo pequeo.  1  cucharada........Un dedo.  2 cucharadas......Una pelota de golf.   taza...............La mitad de un puo.  1 taza................Un puo. EJEMPLO DE PLAN DE ALIMENTACIN PARA DIABTICOS: A continuacin se muestra un ejemplo de plan de alimentacin que incluye comidas de los grupos de granos y fculas, vegetales, frutas y carnes. Un nutricionista podr confeccionarle un plan individualizado para cubrir sus necesidades calricas y decirle el nmero de porciones que necesita de cada grupo. Sin embargo, podra intercambiar los alimentos que contengan carbohidratos (lcteos, cereales y frutas). Controlar la cantidad total de carbohidratos en los alimentos o colaciones es ms importante que asegurarse de incluir todos los grupos alimenticios cada vez que come.  El siguiente plan de alimentacin es un ejemplo de una dieta de 2000 caloras mediante el conteo de carbohidratos. Este plan contiene 17 porciones de carbohidratos. Desayuno  1 taza de avena (2 porciones de carbohidratos).   taza de yogur light(1 porcin de carbohidratos).  1 taza de arndanos (1 porcin de carbohidratos).   taza de almendras. Colacin  1 manzana grande (2 porciones de carbohidratos).  1 palito de queso bajo en grasa. Almuerzo  Ensalada de pechuga de pollo.  1 taza de espinacas.   taza de tomates cortados.  2 oz (60 gr) de pechuga de pollo en rebanadas.  2 cucharadas de aderezo italiano bajo en contenido graso.  12 galletas integrales (2 porciones de carbohidratos).  12 a 15 uvas (1 porcin de carbohidratos).  1 taza de leche descremada (1porcin de carbohidratos). Colacin  1 taza de zanahorias.   taza de pur de garbanzos (1 porcin de carbohidratos). Cena  3 oz (80 gr) de salmn a la parrilla.  1 taza de arroz integral (3 porciones de carbohidratos). Colacin  1  taza de brcoli al vapor (1 porcin de carbohidrato) con una cucharadita de aceite de oliva y jugo de limn.  1 taza de  budn light (2 porciones de carbohidratos). HOJA DE PLANEAMIENTO DE LA ALIMENTACIN: El dietista podr utilizar esta hoja para ayudarlo a decidir cuntas porciones y qu tipos de alimentos son los adecuados para usted.  DESAYUNO Grupo de alimentos y porciones / Alimento elegido Granos/Fculas_________________________________________________ Lcteos________________________________________________________ Vegetales ______________________________________________________ Fruta __________________________________________________________ Carnes _________________________________________________________ Grasas _________________________________________________________ ALMUERZO Grupo de alimentos y porciones / Alimento elegido Granos/Fculas___________________________________________________ Lcteos_________________________________________________________ Fruta ___________________________________________________________ Carnes __________________________________________________________ Grasas __________________________________________________________ CENA Grupo de alimentos y porciones / Alimento elegido Granos/Fculas___________________________________________________ Lcteos_________________________________________________________ Fruta ___________________________________________________________ Carnes __________________________________________________________ Grasas __________________________________________________________ COLACIN Grupo de alimentos y porciones / Alimento elegido Granos/Fculas_________________________________________________ Lcteos________________________________________________________ Vegetales ______________________________________________________ Fruta _________________________________________________________ Carnes ________________________________________________________ Grasas ________________________________________________________ TOTALES  DIARIOS Fculas_______________________________________________________ Vegetales _____________________________________________________ Fruta ________________________________________________________ Lcteos_______________________________________________________ Carnes________________________________________________________ Grasas ________________________________________________________ Document Released: 03/16/2008 Document Revised: 03/01/2012 ExitCare Patient Information 2014 [employer].  

## 2013-12-12 NOTE — Progress Notes (Signed)
Hx A2 GDM. 1 hr glucola today.  Will talk with Misty Stanley re: payment plan for PPS.  Reviewed anatomic US> all WNL, confirms LMP dating.

## 2013-12-13 ENCOUNTER — Telehealth: Payer: Self-pay

## 2013-12-13 NOTE — Telephone Encounter (Signed)
Message copied by Louanna Raw on Tue Dec 13, 2013 10:13 AM ------      Message from: Reva Bores      Created: Tue Dec 13, 2013  8:05 AM       Failed 1 hour, she needs a 3 hour ASAP ------

## 2013-12-13 NOTE — Telephone Encounter (Signed)
Called pt. With Kimberly Avila And left message stating we are calling with results please call clinic.

## 2013-12-20 NOTE — Telephone Encounter (Signed)
Sent letter

## 2013-12-20 NOTE — Telephone Encounter (Signed)
Called pt. With Kimberly Avila and left message stating we are calling with results please call clinic. Tried all numbers all of which went straight to voicemail.

## 2013-12-22 NOTE — L&D Delivery Note (Signed)
Attestation of Attending Supervision of Advanced Practitioner (PA/CNM/NP): Evaluation and management procedures were performed by the Advanced Practitioner under my supervision and collaboration.  I have reviewed the Advanced Practitioner's note and chart, and I agree with the management and plan.  Fredick Schlosser, MD, FACOG Attending Obstetrician & Gynecologist Faculty Practice, [facility]  

## 2013-12-22 NOTE — L&D Delivery Note (Signed)
Delivery Note At 4:47 PM a viable and healthy female was delivered via Vaginal, Spontaneous Delivery (Presentation: ;  ).  APGAR: 10, 10; weight pending.   Placenta status: Intact, Spontaneous.  Cord: 3 vessels with the following complications: None.  Cord pH: n/a  Anesthesia: None  Episiotomy: None Lacerations: None Est. Blood Loss (mL): 250  Mom to postpartum.  Baby to Couplet care / Skin to Skin.  [facility]UH[facility],[name] 03/21/2014, 5:05 PM

## 2014-01-02 ENCOUNTER — Ambulatory Visit (INDEPENDENT_AMBULATORY_CARE_PROVIDER_SITE_OTHER): Payer: Self-pay | Admitting: Obstetrics and Gynecology

## 2014-01-02 VITALS — BP 100/65 | Temp 96.8°F | Wt 166.7 lb

## 2014-01-02 DIAGNOSIS — O0942 Supervision of pregnancy with grand multiparity, second trimester: Principal | ICD-10-CM

## 2014-01-02 DIAGNOSIS — Z8632 Personal history of gestational diabetes: Secondary | ICD-10-CM

## 2014-01-02 DIAGNOSIS — O2390 Unspecified genitourinary tract infection in pregnancy, unspecified trimester: Secondary | ICD-10-CM

## 2014-01-02 DIAGNOSIS — IMO0001 Reserved for inherently not codable concepts without codable children: Secondary | ICD-10-CM

## 2014-01-02 DIAGNOSIS — O09892 Supervision of other high risk pregnancies, second trimester: Secondary | ICD-10-CM

## 2014-01-02 DIAGNOSIS — O09299 Supervision of pregnancy with other poor reproductive or obstetric history, unspecified trimester: Secondary | ICD-10-CM

## 2014-01-02 DIAGNOSIS — O0940 Supervision of pregnancy with grand multiparity, unspecified trimester: Secondary | ICD-10-CM

## 2014-01-02 DIAGNOSIS — O09899 Supervision of other high risk pregnancies, unspecified trimester: Secondary | ICD-10-CM

## 2014-01-02 DIAGNOSIS — B951 Streptococcus, group B, as the cause of diseases classified elsewhere: Secondary | ICD-10-CM

## 2014-01-02 DIAGNOSIS — Z23 Encounter for immunization: Secondary | ICD-10-CM

## 2014-01-02 DIAGNOSIS — O0992 Supervision of high risk pregnancy, unspecified, second trimester: Secondary | ICD-10-CM

## 2014-01-02 DIAGNOSIS — O09292 Supervision of pregnancy with other poor reproductive or obstetric history, second trimester: Secondary | ICD-10-CM

## 2014-01-02 DIAGNOSIS — O2340 Unspecified infection of urinary tract in pregnancy, unspecified trimester: Secondary | ICD-10-CM

## 2014-01-02 DIAGNOSIS — N390 Urinary tract infection, site not specified: Secondary | ICD-10-CM

## 2014-01-02 LAB — POCT URINALYSIS DIP (DEVICE)
Bilirubin Urine: NEGATIVE
Glucose, UA: NEGATIVE mg/dL
Hgb urine dipstick: NEGATIVE
Ketones, ur: NEGATIVE mg/dL
NITRITE: NEGATIVE
PH: 7 (ref 5.0–8.0)
PROTEIN: NEGATIVE mg/dL
Specific Gravity, Urine: 1.015 (ref 1.005–1.030)
Urobilinogen, UA: 1 mg/dL (ref 0.0–1.0)

## 2014-01-02 LAB — CBC
HCT: 29.4 % — ABNORMAL LOW (ref 36.0–46.0)
Hemoglobin: 9.6 g/dL — ABNORMAL LOW (ref 12.0–15.0)
MCH: 25 pg — ABNORMAL LOW (ref 26.0–34.0)
MCHC: 32.7 g/dL (ref 30.0–36.0)
MCV: 76.6 fL — ABNORMAL LOW (ref 78.0–100.0)
Platelets: 210 10*3/uL (ref 150–400)
RBC: 3.84 MIL/uL — ABNORMAL LOW (ref 3.87–5.11)
RDW: 14.6 % (ref 11.5–15.5)
WBC: 9.1 10*3/uL (ref 4.0–10.5)

## 2014-01-02 LAB — RPR

## 2014-01-02 MED ORDER — TETANUS-DIPHTH-ACELL PERTUSSIS 5-2.5-18.5 LF-MCG/0.5 IM SUSP: 0.5000 mL | Freq: Once | INTRAMUSCULAR | Status: DC

## 2014-01-02 NOTE — Progress Notes (Signed)
Patient doing well without complaints. FM/PTL precautions reviewed. Patient failed 1 hr GCT and will return for 3 hr test tomorrow. Patient with history of GDM with previous pregnancy. Patient very much interested in BTL- [place] in [place] Research scientist (physical sciences)(financial counselor) Tdap today

## 2014-01-02 NOTE — Addendum Note (Signed)
Addended by: Faythe CasaBELLAMY, Erek Kowal M on: 01/02/2014 10:23 AM   Modules accepted: Orders

## 2014-01-02 NOTE — Progress Notes (Signed)
P=84  Pt is scheduled tomorrow for follow up 3 hour Gtt.  Needs lab work/Tdap

## 2014-01-02 NOTE — Progress Notes (Signed)
Following MD visit, patient was found to be tearful and discussed a home situation with Spanish interpreter. Patient was asked to wait in waiting area to be seen by MD again but left without being seen. Patient is scheduled to return tomorrow for 3hr glucola and could be assessed for depression at that time by in house MD and SW.

## 2014-01-03 ENCOUNTER — Other Ambulatory Visit: Payer: Medicaid Other

## 2014-01-03 DIAGNOSIS — O99810 Abnormal glucose complicating pregnancy: Secondary | ICD-10-CM

## 2014-01-03 DIAGNOSIS — Z302 Encounter for sterilization: Secondary | ICD-10-CM | POA: Insufficient documentation

## 2014-01-04 ENCOUNTER — Encounter: Payer: Self-pay | Admitting: Obstetrics and Gynecology

## 2014-01-04 LAB — GLUCOSE TOLERANCE, 3 HOURS
GLUCOSE 3 HOUR GTT: 118 mg/dL (ref 70–144)
Glucose Tolerance, 1 hour: 190 mg/dL — ABNORMAL HIGH (ref 70–189)
Glucose Tolerance, 2 hour: 103 mg/dL (ref 70–164)
Glucose Tolerance, Fasting: 80 mg/dL (ref 70–104)

## 2014-01-16 ENCOUNTER — Encounter: Payer: Self-pay | Admitting: Obstetrics & Gynecology

## 2014-01-23 ENCOUNTER — Encounter: Payer: Self-pay | Admitting: Obstetrics & Gynecology

## 2014-01-30 ENCOUNTER — Ambulatory Visit (INDEPENDENT_AMBULATORY_CARE_PROVIDER_SITE_OTHER): Payer: Self-pay | Admitting: Obstetrics & Gynecology

## 2014-01-30 ENCOUNTER — Encounter: Payer: Self-pay | Admitting: *Deleted

## 2014-01-30 VITALS — BP 98/66 | Temp 97.0°F | Wt 168.3 lb

## 2014-01-30 DIAGNOSIS — O2390 Unspecified genitourinary tract infection in pregnancy, unspecified trimester: Secondary | ICD-10-CM

## 2014-01-30 DIAGNOSIS — O0990 Supervision of high risk pregnancy, unspecified, unspecified trimester: Secondary | ICD-10-CM

## 2014-01-30 DIAGNOSIS — Z603 Acculturation difficulty: Secondary | ICD-10-CM

## 2014-01-30 DIAGNOSIS — Z609 Problem related to social environment, unspecified: Secondary | ICD-10-CM

## 2014-01-30 DIAGNOSIS — O0940 Supervision of pregnancy with grand multiparity, unspecified trimester: Principal | ICD-10-CM

## 2014-01-30 DIAGNOSIS — N390 Urinary tract infection, site not specified: Secondary | ICD-10-CM

## 2014-01-30 DIAGNOSIS — Z789 Other specified health status: Secondary | ICD-10-CM | POA: Insufficient documentation

## 2014-01-30 DIAGNOSIS — O09899 Supervision of other high risk pregnancies, unspecified trimester: Secondary | ICD-10-CM

## 2014-01-30 DIAGNOSIS — O2340 Unspecified infection of urinary tract in pregnancy, unspecified trimester: Secondary | ICD-10-CM

## 2014-01-30 DIAGNOSIS — B951 Streptococcus, group B, as the cause of diseases classified elsewhere: Secondary | ICD-10-CM

## 2014-01-30 LAB — POCT URINALYSIS DIP (DEVICE)
Bilirubin Urine: NEGATIVE
GLUCOSE, UA: NEGATIVE mg/dL
Hgb urine dipstick: NEGATIVE
Ketones, ur: NEGATIVE mg/dL
Nitrite: NEGATIVE
Protein, ur: NEGATIVE mg/dL
SPECIFIC GRAVITY, URINE: 1.02 (ref 1.005–1.030)
UROBILINOGEN UA: 1 mg/dL (ref 0.0–1.0)
pH: 7 (ref 5.0–8.0)

## 2014-01-30 MED ORDER — DOCUSATE SODIUM 100 MG PO CAPS: 100.0000 mg | ORAL_CAPSULE | Freq: Two times a day (BID) | ORAL | Status: DC | PRN

## 2014-01-30 MED ORDER — FERROUS SULFATE 325 (65 FE) MG PO TABS: 325.0000 mg | ORAL_TABLET | Freq: Two times a day (BID) | ORAL | Status: DC

## 2014-01-30 NOTE — Patient Instructions (Addendum)
Informacin sobre el dispositivo intrauterino (Intrauterine Device Information) Un dispositivo intrauterino (DIU) se inserta en el tero e impide el embarazo. Hay dos tipos de DIU:   DIU de cobre (Paragard): este tipo de DIU est recubierto con un alambre de cobre y se inserta dentro del tero. El cobre hace que el tero y las trompas de Falopio produzcan un liquido que [employer] espermatozoides. El DIU de cobre puede Geneticist, molecular durante 10 aos.  DIU con hormona (Mirena): este tipo de DIU contiene la hormona progestina (progesterona sinttica). Las hormonas hacen que el moco cervical se haga ms espeso, lo que evita que el esperma ingrese al tero. Tambin hace que la membrana que recubre internamente al tero sea ms delgada lo que impide el implante del vulo fertilizado. La hormona debilita o destruye los espermatozoides que ingresan al tero. Alguno de los tipos de DIU hormonal pueden Geneticist, molecular durante 5 aos y otros tipos pueden dejarse en el lugar por 3 aos. El mdico se asegurar de que usted sea una buena candidata para usar el DIU. Converse con su mdico acerca de los posibles efectos secundarios.  VENTAJAS DEL DISPOSITIVO INTRAUTERINO  El DIU es muy eficaz, reversible, de accin prolongada y de bajo mantenimiento.  No hay efectos secundarios relacionados con el estrgeno.  El DIU puede ser utilizado durante la Market researcher.  No est asociado con el aumento de [place].  Funciona inmediatamente despus de la insercin.  El DIU hormonal funciona inmediatamente si se inserta dentro de los [address] del inicio del perodo. Ser necesario que utilice un mtodo anticonceptivo adicional durante 7 das si el DIU hormonal se inserta en algn otro momento del ciclo.  El DIU de cobre no interfiere con las hormonas femeninas.  El DIU hormonal puede hacer que los perodos menstruales abundantes se hagan ms ligeros y que haya menos clicos.  El DIU hormonal puede usarse  durante 3 a 5 aos.  El DIU de cobre puede usarse durante 10 aos. DESVENTAJAS DEL DISPOSITIVO INTRAUTERINO  El DIU hormonal puede estar asociado con patrones de sangrado irregular.  El DIU de cobre puede hacer que el flujo menstrual ms abundante y doloroso.  Puede experimentar clicos y sangrado vaginal despus de la insercin. Document Released: 05/28/2010 Document Revised: 08/10/2013 [facility] Patient Information 2014 [place], [place].   Informacin para el paciente: Vasectoma (Conceptos Bsicos)  Qu es la vasectoma? - La vasectoma es un procedimiento que los hombres pueden realizarse como mtodo de planificacin familiar a Air cabin crew. Despus de la vasectoma, el hombre no puede embarazar a Nurse, mental health. De qu manera evita el embarazo la vasectoma? - La vasectoma evita el embarazo porque se bloquea el recorrido que hace el esperma para abandonar el cuerpo  El esperma se produce en los testculos, que se encuentran dentro de un saco de piel llamado "escroto", y se Environmental manager en el epiddimo, que es un pequeo rgano sobre la parte superior de los testculos. En la eyaculacin, el esperma va desde el epiddimo hasta el extremo del pene a travs de tubos. En la vasectoma, el mdico corta y bloquea un tubo llamado "conducto deferente", lo cual impide que el esperma salga del cuerpo. Despus de la vasectoma, el hombre sigue eyaculando un lquido llamado semen, pero este no contiene esperma.  Por qu podra decidir hacerme una vasectoma? - Tal vez decida hacerse una vasectoma si no quiere tener ms hijos ni usar mtodos de planificacin familiar cada vez que tiene Clinical research associate. Consulte a su  mdico cualquier duda o preocupacin que tenga con respecto a la vasectoma. l puede explicarle el procedimiento.  Algunos hombres deciden guardar [place]una muestra de su esperma antes de realizarse la vasectoma. Si quiere guardar una muestra de su esperma, hable con su mdico. En qu consiste la  vasectoma? - La vasectoma se realiza en el consultorio del mdico en aproximadamente 30 minutos. Durante el procedimiento, el mdico duerme la piel del escroto y luego hace un pequeo corte en la piel para llegar al conducto deferente, cortarlo y cerrarlo. El procedimiento no duele, pero algunos hombres sienten calambres o un tirn. Qu sucede despus de la vasectoma? - Puede regresar a su casa despus del procedimiento, pero debe descansar entre 2 y [address] das. Despus de la vasectoma, la Harley-Davidsonmayora de los hombres tienen algo de Water engineerdolor en el escroto. Su mdico le dir qu medicinas para IT sales professionalel dolor debe tomar. Tambin es posible que le recete una medicina para este fin. Su mdico le dar instrucciones sobre qu debe y no debe hacer despus de la vasectoma. Probablemente le diga que: Utilice un suspensorio para Catering managersostener el vendaje en su lugar.  No se bae ni nade por Henry Scheinuno o dos das.  No levante objetos pesados ni haga ejercicio de mucho esfuerzo por 7 das.  Espere 7 das para tener The St. Paul Travelersrelaciones sexuales. Despus de eso, debe usar otro mtodo de planificacin familiar por unos meses para evitar el embarazo, ya que Cules son los efectos secundarios de la vasectoma? - Los efectos secundarios son poco frecuentes, Sales promotion account executivepero pueden aparecer. Algunos de ellos son: MusicianDolor en el escroto  Sangrado en el escroto  Infeccin de la piel alrededor del corte Si tiene algn efecto secundario, infrmeselo a su mdico. Algunos efectos secundarios desaparecen por s solos con Museum/gallery conservatorel tiempo, pero para otros podra ser Firefighternecesario un tratamiento.  Cunto tiempo debe pasar para que funcione la vasectoma? - Deben pasar unos meses para que funcione la vasectoma, ya que es posible que an quede Goldman Sachsesperma en los tubos. Un hombre necesita eyacular 20 veces o ms despus de una vasectoma para eliminar todo el esperma de los tubos, por esto, la pareja debe utilizar otro mtodo de planificacin familiar por unos meses para Pharmacist, hospitalevitar el  embarazo. Debo hacerme una prueba de seguimiento? - S. Debe hacerse una prueba de seguimiento llamada "recuento de esperma" para asegurarse de que ya no tiene esperma. La mayora de los hombres se realizan esta prueba 3 meses despus de la vasectoma. El recuento de esperma verifica la cantidad de esperma en Lauris Poaguna muestra de semen que el hombre debe proporcionar para la prueba. Si su muestra no tiene esperma, puede dejar de [place] Republicutilizar otro mtodo de planificacin familiar, ya que no podr dejar embarazada a Nurse, mental healthuna mujer. Si la [place]muestra contiene esperma, puede dejar embarazada a [place]una mujer, lo que significa que debe seguir utilizando un mtodo de planificacin familiar hasta realizarse otro recuento de esperma. Qu sucede si me hice una vasectoma y Johnsie Kindredahora quiero tener un hijo? - Si se ha hecho una vasectoma y ahora quiere tener un hijo, consulte a su mdico. l puede hacer una ciruga para volver a Equities traderconectar el conducto deferente y abrir el recorrido del esperma, pero esta ciruga no siempre funciona. La vasectoma previene enfermedades de transmisin sexual? - No. La vasectoma no lo protege contra enfermedades de transmisin sexual. Para evitar contraer o transmitir una enfermedad de transmisin sexual, debe utilizar un tipo de proteccin llamado condn. Ms informacin sobre este tema   Burkina Fasona vasectoma es [place]una  ciruga que se [place] que no desean tener hijos, e impide que el esperma salga de sus cuerpos. De esta forma, un hombre operado no puede embarazar a Nurse, mental health. El esperma se produce en los testculos y se almacena en el epiddimo. Durante una vasectoma, un mdico corta y [employer] tubos por donde sale el esperma del epiddimo. En conjunto dichos tubos, uno al lado izquierdo y el otro al lado derecho, se llaman "conductos deferentes". Despus de la ciruga, el esperma del hombre se reabsorbe en el cuerpo. El esperma no sale cuando el hombre [place].  [place] [address] Versin  [id number]

## 2014-01-30 NOTE — Progress Notes (Signed)
Patient is Spanish-speaking only, Spanish interpreter present for this encounter. Discussed obstacles to getting BTL, she has talked with Bur[place] FosterReyna, unable to make payments. Will talk to SW about BTL lottery at the HD.  Until then, counseled about IUD, she is leaning towards Paragard. Also talked about vasectomy for her husband, there are funds available but he does not want to do it as he feels it will interfere with his sexual activity. Patient was told this was not true, but she feels her husband will not change his mind. No other complaints or concerns.  Fetal movement and labor precautions reviewed.

## 2014-01-30 NOTE — Progress Notes (Signed)
P=87 C/o of intermittent lower abdominal/pelvic pressure; pain on right side.  Edema in feet.

## 2014-02-08 ENCOUNTER — Encounter: Payer: Self-pay | Admitting: *Deleted

## 2014-02-13 ENCOUNTER — Encounter: Payer: Self-pay | Admitting: Family Medicine

## 2014-02-25 ENCOUNTER — Encounter: Payer: Self-pay | Admitting: *Deleted

## 2014-03-02 ENCOUNTER — Inpatient Hospital Stay (HOSPITAL_COMMUNITY)
Admission: AD | Admit: 2014-03-02 | Discharge: 2014-03-02 | Disposition: A | Discharge status: Home / Self Care | Payer: Medicaid Other | Source: Ambulatory Visit | Attending: Obstetrics & Gynecology | Admitting: Obstetrics & Gynecology

## 2014-03-02 ENCOUNTER — Encounter (HOSPITAL_COMMUNITY): Payer: Self-pay

## 2014-03-02 DIAGNOSIS — O0940 Supervision of pregnancy with grand multiparity, unspecified trimester: Secondary | ICD-10-CM

## 2014-03-02 DIAGNOSIS — O2390 Unspecified genitourinary tract infection in pregnancy, unspecified trimester: Secondary | ICD-10-CM | POA: Insufficient documentation

## 2014-03-02 DIAGNOSIS — A499 Bacterial infection, unspecified: Secondary | ICD-10-CM | POA: Insufficient documentation

## 2014-03-02 DIAGNOSIS — B3731 Acute candidiasis of vulva and vagina: Secondary | ICD-10-CM | POA: Insufficient documentation

## 2014-03-02 DIAGNOSIS — O09899 Supervision of other high risk pregnancies, unspecified trimester: Secondary | ICD-10-CM

## 2014-03-02 DIAGNOSIS — O0990 Supervision of high risk pregnancy, unspecified, unspecified trimester: Secondary | ICD-10-CM

## 2014-03-02 DIAGNOSIS — B951 Streptococcus, group B, as the cause of diseases classified elsewhere: Secondary | ICD-10-CM

## 2014-03-02 DIAGNOSIS — N760 Acute vaginitis: Secondary | ICD-10-CM | POA: Insufficient documentation

## 2014-03-02 DIAGNOSIS — B373 Candidiasis of vulva and vagina: Secondary | ICD-10-CM | POA: Insufficient documentation

## 2014-03-02 DIAGNOSIS — O09299 Supervision of pregnancy with other poor reproductive or obstetric history, unspecified trimester: Secondary | ICD-10-CM

## 2014-03-02 DIAGNOSIS — B9689 Other specified bacterial agents as the cause of diseases classified elsewhere: Secondary | ICD-10-CM | POA: Insufficient documentation

## 2014-03-02 DIAGNOSIS — O2340 Unspecified infection of urinary tract in pregnancy, unspecified trimester: Secondary | ICD-10-CM

## 2014-03-02 DIAGNOSIS — Z8632 Personal history of gestational diabetes: Secondary | ICD-10-CM

## 2014-03-02 DIAGNOSIS — Z302 Encounter for sterilization: Secondary | ICD-10-CM

## 2014-03-02 MED ORDER — FLUCONAZOLE 150 MG PO TABS
150.0000 mg | ORAL_TABLET | Freq: Once | ORAL | Status: AC
  Administered 2014-03-02: 150 mg via ORAL
  Filled 2014-03-02: qty 1

## 2014-03-02 MED ORDER — METRONIDAZOLE 500 MG PO TABS: 500.0000 mg | ORAL_TABLET | Freq: Two times a day (BID) | ORAL | Status: DC

## 2014-03-02 NOTE — MAU Provider Note (Signed)
None     Chief Complaint:  Vaginal discharge/itching for 2 days  Kimberly Avila is  33 y.o. [phone] at 10235w2d presents complaining of Vaginal discharge/itching for 2 days.She also has pressure in her pelvis and some back pain  none vaginal bleeding, intact membranes, along with active fetal movement.  [facility]  Obstetrical/Gynecological History: OB History   Grav Para Term Preterm Abortions TAB SAB Ect Mult Living   6 5 5       5      Past Medical History: Past Medical History  Diagnosis Date  . No pertinent past medical history   . Gestational diabetes     glyburide    Past Surgical History: Past Surgical History  Procedure Laterality Date  . No past surgeries      Family History: Family History  Problem Relation Age of Onset  . Diabetes Maternal Uncle     Social History: History  Substance Use Topics  . Smoking status: Never Smoker   . Smokeless tobacco: Never Used  . Alcohol Use: No    Allergies:  Allergies  Allergen Reactions  . Gelatin Itching  . Nyquil [Pseudoeph-Doxylamine-Dm-Apap] Itching  . Tylenol [Acetaminophen] Itching    Meds:  Facility-administered medications prior to admission  Medication Dose Route Frequency Provider Last Rate Last Dose  . Tdap (BOOSTRIX) injection 0.5 mL  0.5 mL Intramuscular Once Catalina AntiguaPeggy Constant, MD       Prescriptions prior to admission  Medication Sig Dispense Refill  . ferrous sulfate (FERROUSUL) 325 (65 FE) MG tablet Take 1 tablet (325 mg total) by mouth 2 (two) times daily.  60 tablet  1  . Multiple Vitamin (MULTIVITAMIN) capsule Take 1 capsule by mouth daily.        Review of Systems   Constitutional: Negative for fever and chills Eyes: Negative for visual disturbances Respiratory: Negative for shortness of breath, dyspnea Cardiovascular: Negative for chest pain or palpitations  Gastrointestinal: Negative for vomiting, diarrhea and constipation Genitourinary: Negative for dysuria and  urgency Musculoskeletal: Negative for back pain, joint pain, myalgias  Neurological: Negative for dizziness and headaches   Physical Exam  Blood pressure 102/68, pulse 81, temperature 97.8 F (36.6 C), temperature source Oral, height 5' (1.524 m), weight 78.019 kg (172 lb), last menstrual period 06/21/2013. GENERAL: Well-developed, well-nourished female in no acute distress.  LUNGS: Clear to auscultation bilaterally.  HEART: Regular rate and rhythm. ABDOMEN: Soft, nontender, nondistended, gravid.  EXTREMITIES: Nontender, no edema, 2+ distal pulses. DTR's 2+ PELVIC:  Vulva edematous, varicosities + SSE:  Thin white discharge with amine odor. Also some white plaques on sidewalls.  Sidewalls red CERVICAL EXAM: Dilatation 3cm   Effacement 50%   Station -3   Presentation: cephalic FHT:  Baseline rate 150 bpm   Variability moderate  Accelerations present   Decelerations none Contractions: Every 0 mins   Labs: Wet prep + clue cells, few yeast Imaging Studies:  No results found.  Assessment: Kimberly Avila is  33 y.o. [phone] at 6735w2d presents with BV, some yeast.  Plan: Diflucan here; rx for PO flagyl  CRESENZO-DISHMAN,Apostolos Blagg 3/12/20159:06 PM

## 2014-03-02 NOTE — MAU Note (Signed)
PT SAYS SHE GETS PNC-  [facility] CL[facility]--  SAYS SHE HAS VAG D/C- THAT ITCHES- THAT STARTED ON 3-10  -   NO VE IN CLINIC.

## 2014-03-02 NOTE — Discharge Instructions (Signed)
Vaginosis bacteriana  (Bacterial Vaginosis)  La vaginosis bacteriana es una infección vaginal que perturba el equilibrio normal de las bacterias que se encuentran en la vagina. Es el resultado de un crecimiento excesivo de ciertas bacterias. Esta es la infección vaginal más frecuente en mujeres en edad reproductiva. El tratamiento es importante para prevenir complicaciones, especialmente en mujeres embarazadas, dado que puede causar un parto prematuro.  CAUSAS   La vaginosis bacteriana se origina por un aumento de bacterias nocivas que, generalmente, están presentes en cantidades más pequeñas en la vagina. Varios tipos diferentes de bacterias pueden causar esta afección. Sin embargo, la causa de su desarrollo no se comprende totalmente.  FACTORES DE RIESGO  Ciertas actividades o comportamientos pueden exponerlo a un mayor riesgo de desarrollar vaginosis bacteriana, entre los que se incluyen:  · Tener una nueva pareja sexual o múltiples parejas sexuales.  · Las duchas vaginales  · El uso del DIU (dispositivo intrauterino) como método anticonceptivo.  El contagio no se produce en baños, por ropas de cama, en piscinas o por contacto con objetos.  SIGNOS Y SÍNTOMAS   Algunas mujeres que padecen vaginosis bacteriana no presentan signos ni síntomas. Los síntomas más comunes son:  · Secreción vaginal de color grisáceo.  · Secreción vaginal con olor similar al pescado, especialmente después de mantener relaciones sexuales.  · Picazón o sensación de ardor en la vagina o la vulva.  · Ardor o dolor al orinar.  DIAGNÓSTICO   Su médico analizará su historia clínica y le examinará la vagina para detectar signos de vaginosis bacteriana. Puede tomarle una muestra de flujo vaginal. Su médico examinará esta muestra con un microscopio para controlar las bacterias y células anormales. También puede realizarse un análisis del pH vaginal.   TRATAMIENTO   La vaginosis bacteriana puede tratarse con antibióticos, en forma de comprimidos o  de crema vaginal. Puede indicarse una segunda tanda de antibióticos si la afección se repite después del tratamiento.   INSTRUCCIONES PARA EL CUIDADO EN EL HOGAR   · Tome solo medicamentos de venta libre o recetados, según las indicaciones del médico.  · Si le han recetado antibióticos, tómelos como se le indicó. Asegúrese de que finaliza la prescripción completa aunque se sienta mejor.  · No mantenga relaciones sexuales hasta completar el tratamiento.  · Comunique a sus compañeros sexuales que sufre una infección vaginal. Deben consultar a su médico y recibir tratamiento si tienen problemas, como picazón o una erupción cutánea leve.  · Practique el sexo seguro usando preservativos y tenga un único compañero sexual.  SOLICITE ATENCIÓN MÉDICA SI:   · Sus síntomas no mejoran después de 3 días de tratamiento.  · Aumenta la secreción o el dolor.  · Tiene fiebre.  ASEGÚRESE DE QUE:   · Comprende estas instrucciones.  · Controlará su afección.  · Recibirá ayuda de inmediato si no mejora o si empeora.  PARA OBTENER MÁS INFORMACIÓN   Centros para el control y la prevención de enfermedades (Centers for Disease Control and Prevention, CDC): www.cdc.gov/std  Asociación Est[place]oun[place]ense de la Salud Sexual (American Sexual Health Association, SHA): www.ashastd.org   Document Released: 03/16/2008 Document Revised: 09/28/2013  ExitCare® Patient Information ©2014 [employer].

## 2014-03-03 NOTE — MAU Provider Note (Signed)
Attestation of Attending Supervision of Advanced Practitioner (PA/CNM/NP): Evaluation and management procedures were performed by the Advanced Practitioner under my supervision and collaboration.  I have reviewed the Advanced Practitioner's note and chart, and I agree with the management and plan.  Raysha Tilmon, MD, FACOG Attending Obstetrician & Gynecologist Faculty Practice, [facility]  

## 2014-03-06 ENCOUNTER — Ambulatory Visit (INDEPENDENT_AMBULATORY_CARE_PROVIDER_SITE_OTHER): Payer: Medicaid Other | Admitting: Obstetrics & Gynecology

## 2014-03-06 VITALS — BP 109/67 | Temp 97.1°F | Wt 97.1 lb

## 2014-03-06 DIAGNOSIS — Z8632 Personal history of gestational diabetes: Principal | ICD-10-CM

## 2014-03-06 DIAGNOSIS — O0990 Supervision of high risk pregnancy, unspecified, unspecified trimester: Secondary | ICD-10-CM

## 2014-03-06 DIAGNOSIS — O09299 Supervision of pregnancy with other poor reproductive or obstetric history, unspecified trimester: Secondary | ICD-10-CM

## 2014-03-06 LAB — POCT URINALYSIS DIP (DEVICE)
BILIRUBIN URINE: NEGATIVE
Glucose, UA: NEGATIVE mg/dL
Hgb urine dipstick: NEGATIVE
Ketones, ur: NEGATIVE mg/dL
NITRITE: NEGATIVE
PH: 6 (ref 5.0–8.0)
Protein, ur: NEGATIVE mg/dL
Specific Gravity, Urine: 1.015 (ref 1.005–1.030)
Urobilinogen, UA: 1 mg/dL (ref 0.0–1.0)

## 2014-03-06 LAB — GLUCOSE, CAPILLARY: GLUCOSE-CAPILLARY: 133 mg/dL — AB (ref 70–99)

## 2014-03-06 NOTE — Progress Notes (Signed)
P= 99 Pt. Reports she hasn't felt baby move this morning; felt last night before she went to bed.  GBS and cultures today.  Pt. Requests refill of iron supplement.

## 2014-03-06 NOTE — Progress Notes (Signed)
Baby visibilty moving and pt feels fetal mvmt now.  Pt had spotting a few hours after labor check on Friday.  It is now gone.  Pt reassured that bleeding is nml after a cervical exam.  Iron refilled.  CBG today = 133.  Pt will start check CBGs fasting and 2 hr pp.  Pt given bottle of strips.

## 2014-03-13 ENCOUNTER — Ambulatory Visit (INDEPENDENT_AMBULATORY_CARE_PROVIDER_SITE_OTHER): Payer: Medicaid Other | Admitting: Obstetrics & Gynecology

## 2014-03-13 VITALS — BP 111/67 | Temp 97.0°F | Wt 171.4 lb

## 2014-03-13 DIAGNOSIS — O09299 Supervision of pregnancy with other poor reproductive or obstetric history, unspecified trimester: Secondary | ICD-10-CM

## 2014-03-13 DIAGNOSIS — O0990 Supervision of high risk pregnancy, unspecified, unspecified trimester: Secondary | ICD-10-CM

## 2014-03-13 DIAGNOSIS — Z8632 Personal history of gestational diabetes: Principal | ICD-10-CM

## 2014-03-13 LAB — POCT URINALYSIS DIP (DEVICE)
BILIRUBIN URINE: NEGATIVE
GLUCOSE, UA: NEGATIVE mg/dL
Hgb urine dipstick: NEGATIVE
Ketones, ur: NEGATIVE mg/dL
NITRITE: NEGATIVE
PH: 6 (ref 5.0–8.0)
Protein, ur: NEGATIVE mg/dL
Specific Gravity, Urine: 1.02 (ref 1.005–1.030)
Urobilinogen, UA: 1 mg/dL (ref 0.0–1.0)

## 2014-03-13 NOTE — Patient Instructions (Signed)

## 2014-03-13 NOTE — Progress Notes (Signed)
Pulse- 82 Patient requests Rx for PNV and refill on iron; reports some pelvic pressure and a few strong contractions; no results on GBS or Gc/Ch

## 2014-03-13 NOTE — Progress Notes (Signed)
Not checking BS often, states elevated but did not bring book. Urged to record as instructed and bring her record. CT and GC today. Pos GBS urine culture this preg. RTC 3 days with book, consider IOL 39 or 40 weeks

## 2014-03-14 LAB — GC/CHLAMYDIA PROBE AMP
CT Probe RNA: NEGATIVE
GC Probe RNA: NEGATIVE

## 2014-03-16 ENCOUNTER — Ambulatory Visit (HOSPITAL_COMMUNITY)
Admission: RE | Admit: 2014-03-16 | Discharge: 2014-03-16 | Disposition: A | Discharge status: Home / Self Care | Payer: Medicaid Other | Source: Ambulatory Visit | Attending: Family Medicine | Admitting: Family Medicine

## 2014-03-16 ENCOUNTER — Other Ambulatory Visit: Payer: Self-pay

## 2014-03-16 ENCOUNTER — Ambulatory Visit (INDEPENDENT_AMBULATORY_CARE_PROVIDER_SITE_OTHER): Payer: Medicaid Other | Admitting: Family Medicine

## 2014-03-16 VITALS — BP 102/67 | Temp 96.8°F | Wt 169.0 lb

## 2014-03-16 DIAGNOSIS — O0990 Supervision of high risk pregnancy, unspecified, unspecified trimester: Secondary | ICD-10-CM

## 2014-03-16 DIAGNOSIS — O24919 Unspecified diabetes mellitus in pregnancy, unspecified trimester: Secondary | ICD-10-CM

## 2014-03-16 DIAGNOSIS — O3660X0 Maternal care for excessive fetal growth, unspecified trimester, not applicable or unspecified: Secondary | ICD-10-CM | POA: Insufficient documentation

## 2014-03-16 DIAGNOSIS — Z3480 Encounter for supervision of other normal pregnancy, unspecified trimester: Secondary | ICD-10-CM

## 2014-03-16 DIAGNOSIS — O99810 Abnormal glucose complicating pregnancy: Secondary | ICD-10-CM | POA: Insufficient documentation

## 2014-03-16 DIAGNOSIS — O24419 Gestational diabetes mellitus in pregnancy, unspecified control: Secondary | ICD-10-CM

## 2014-03-16 DIAGNOSIS — O09899 Supervision of other high risk pregnancies, unspecified trimester: Secondary | ICD-10-CM

## 2014-03-16 DIAGNOSIS — Z794 Long term (current) use of insulin: Secondary | ICD-10-CM

## 2014-03-16 DIAGNOSIS — O0940 Supervision of pregnancy with grand multiparity, unspecified trimester: Secondary | ICD-10-CM

## 2014-03-16 DIAGNOSIS — O2390 Unspecified genitourinary tract infection in pregnancy, unspecified trimester: Secondary | ICD-10-CM

## 2014-03-16 DIAGNOSIS — O09299 Supervision of pregnancy with other poor reproductive or obstetric history, unspecified trimester: Secondary | ICD-10-CM | POA: Insufficient documentation

## 2014-03-16 LAB — POCT URINALYSIS DIP (DEVICE)
Bilirubin Urine: NEGATIVE
GLUCOSE, UA: NEGATIVE mg/dL
HGB URINE DIPSTICK: NEGATIVE
Ketones, ur: NEGATIVE mg/dL
NITRITE: NEGATIVE
Protein, ur: NEGATIVE mg/dL
Specific Gravity, Urine: 1.01 (ref 1.005–1.030)
UROBILINOGEN UA: 1 mg/dL (ref 0.0–1.0)
pH: 7 (ref 5.0–8.0)

## 2014-03-16 MED ORDER — GLYBURIDE 5 MG PO TABS: 5.0000 mg | ORAL_TABLET | Freq: Every day | ORAL | Status: DC

## 2014-03-16 MED ORDER — CEPHALEXIN 500 MG PO CAPS: 500.0000 mg | ORAL_CAPSULE | Freq: Four times a day (QID) | ORAL | Status: AC

## 2014-03-16 NOTE — Progress Notes (Signed)
Pulse 80 Edema trace in hands and feet.

## 2014-03-16 NOTE — Patient Instructions (Signed)
Third Trimester of Pregnancy  The third trimester is from week 29 through week 42, months 7 through 9. The third trimester is a time when the fetus is growing rapidly. At the end of the ninth month, the fetus is about 20 inches in length and weighs 6 10 pounds.   BODY CHANGES  Your body goes through many changes during pregnancy. The changes vary from woman to woman.    Your weight will continue to increase. You can expect to gain 25 35 pounds (11 16 kg) by the end of the pregnancy.   You may begin to get stretch marks on your hips, abdomen, and breasts.   You may urinate more often because the fetus is moving lower into your pelvis and pressing on your bladder.   You may develop or continue to have heartburn as a result of your pregnancy.   You may develop constipation because certain hormones are causing the muscles that push waste through your intestines to slow down.   You may develop hemorrhoids or swollen, bulging veins (varicose veins).   You may have pelvic pain because of the weight gain and pregnancy hormones relaxing your joints between the bones in your pelvis. Back aches may result from over exertion of the muscles supporting your posture.   Your breasts will continue to grow and be tender. A yellow discharge may leak from your breasts called colostrum.   Your belly button may stick out.   You may feel short of breath because of your expanding uterus.   You may notice the fetus "dropping," or moving lower in your abdomen.   You may have a bloody mucus discharge. This usually occurs a few days to a week before labor begins.   Your cervix becomes thin and soft (effaced) near your due date.  WHAT TO EXPECT AT YOUR PRENATAL EXAMS   You will have prenatal exams every 2 weeks until week 36. Then, you will have weekly prenatal exams. During a routine prenatal visit:   You will be weighed to make sure you and the fetus are growing normally.   Your blood pressure is taken.   Your abdomen will be  measured to track your baby's growth.   The fetal heartbeat will be listened to.   Any test results from the previous visit will be discussed.   You may have a cervical check near your due date to see if you have effaced.  At around 36 weeks, your caregiver will check your cervix. At the same time, your caregiver will also perform a test on the secretions of the vaginal tissue. This test is to determine if a type of bacteria, Group B streptococcus, is present. Your caregiver will explain this further.  Your caregiver may ask you:   What your birth plan is.   How you are feeling.   If you are feeling the baby move.   If you have had any abnormal symptoms, such as leaking fluid, bleeding, severe headaches, or abdominal cramping.   If you have any questions.  Other tests or screenings that may be performed during your third trimester include:   Blood tests that check for low iron levels (anemia).   Fetal testing to check the health, activity level, and growth of the fetus. Testing is done if you have certain medical conditions or if there are problems during the pregnancy.  FALSE LABOR  You may feel small, irregular contractions that eventually go away. These are called Braxton Hicks contractions, or   false labor. Contractions may last for hours, days, or even weeks before true labor sets in. If contractions come at regular intervals, intensify, or become painful, it is best to be seen by your caregiver.   SIGNS OF LABOR    Menstrual-like cramps.   Contractions that are 5 minutes apart or less.   Contractions that start on the top of the uterus and spread down to the lower abdomen and back.   A sense of increased pelvic pressure or back pain.   A watery or bloody mucus discharge that comes from the vagina.  If you have any of these signs before the 37th week of pregnancy, call your caregiver right away. You need to go to the hospital to get checked immediately.  HOME CARE INSTRUCTIONS    Avoid all  smoking, herbs, alcohol, and unprescribed drugs. These chemicals affect the formation and growth of the baby.   Follow your caregiver's instructions regarding medicine use. There are medicines that are either safe or unsafe to take during pregnancy.   Exercise only as directed by your caregiver. Experiencing uterine cramps is a good sign to stop exercising.   Continue to eat regular, healthy meals.   Wear a good support bra for breast tenderness.   Do not use hot tubs, steam rooms, or saunas.   Wear your seat belt at all times when driving.   Avoid raw meat, uncooked cheese, cat litter boxes, and soil used by cats. These carry germs that can cause birth defects in the baby.   Take your prenatal vitamins.   Try taking a stool softener (if your caregiver approves) if you develop constipation. Eat more high-fiber foods, such as fresh vegetables or fruit and whole grains. Drink plenty of fluids to keep your urine clear or pale yellow.   Take warm sitz baths to soothe any pain or discomfort caused by hemorrhoids. Use hemorrhoid cream if your caregiver approves.   If you develop varicose veins, wear support hose. Elevate your feet for 15 minutes, 3 4 times a day. Limit salt in your diet.   Avoid heavy lifting, wear low heal shoes, and practice good posture.   Rest a lot with your legs elevated if you have leg cramps or low back pain.   Visit your dentist if you have not gone during your pregnancy. Use a soft toothbrush to brush your teeth and be gentle when you floss.   A sexual relationship may be continued unless your caregiver directs you otherwise.   Do not travel far distances unless it is absolutely necessary and only with the approval of your caregiver.   Take prenatal classes to understand, practice, and ask questions about the labor and delivery.   Make a trial run to the hospital.   Pack your hospital bag.   Prepare the baby's nursery.   Continue to go to all your prenatal visits as directed  by your caregiver.  SEEK MEDICAL CARE IF:   You are unsure if you are in labor or if your water has broken.   You have dizziness.   You have mild pelvic cramps, pelvic pressure, or nagging pain in your abdominal area.   You have persistent nausea, vomiting, or diarrhea.   You have a bad smelling vaginal discharge.   You have pain with urination.  SEEK IMMEDIATE MEDICAL CARE IF:    You have a fever.   You are leaking fluid from your vagina.   You have spotting or bleeding from your vagina.     You have severe abdominal cramping or pain.   You have rapid weight loss or gain.   You have shortness of breath with chest pain.   You notice sudden or extreme swelling of your face, hands, ankles, feet, or legs.   You have not felt your baby move in over an hour.   You have severe headaches that do not go away with medicine.   You have vision changes.  Document Released: 12/02/2001 Document Revised: 08/10/2013 Document Reviewed: 02/08/2013  ExitCare Patient Information 2014 [employer].

## 2014-03-16 NOTE — Progress Notes (Signed)
+  FM, no lof, no vb, occassional ctx  Fasting 75/62/92/72/85 PPB 121/98/118/128/132 PPL 135/128/140/148/160 PPD 152/177/180/170/182/165  Currently on diabetic diet but sugars above goal. Start Glyburide 5mg  qam  NST today NST/AFI this week Growth US, Size>dates  Mild CVAT, mod LE. tx keflex 500mg  QID x7d, send for culture  BTL send to discuss with financial counselor  Kimberly Avila is a 33 y.o. [id number] at 11035w2d by L=20 here for ROB visit.    Discussed with Patient:  - Plans to breast/bottle feed.  All questions answered. - Continue prenatal vitamins. - Reviewed fetal kick counts Pt to perform daily at a time when the baby is active, lie laterally with both hands on belly in quiet room and count all movements (hiccups, shoulder rolls, obvious kicks, etc); pt is to report to clinic MAU for less than 10 movements felt in a 2 hour time period-pt told as soon as she counts 10 movements the count is complete.  - Routine precautions discussed (depression, infection s/s).   Patient provided with all pertinent phone numbers for emergencies. - RTC for any VB, regular, painful cramps/ctxs occurring at a rate of >2/10 min, fever (100.5 or higher), n/v/d, any pain that is unresolving or worsening, LOF, decreased fetal movement, CP, SOB, edema -RTC in one week for next visit.  Problems: Patient Active Problem List   Diagnosis Date Noted  . Language barrier, speaks Spanish only 01/30/2014  . Desire for permanent sterilization 01/03/2014  . Group B streptococcus urinary tract infection complicating pregnancy 11/04/2013  . Grand multiparity in labor and delivery, antepartum 10/31/2013  . 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 Short interval between pregnancies complicating pregnancy, antepartum 10/10/2013  . Supervision of high-risk pregnancy 10/10/2013  . History of gestational diabetes in prior pregnancy, currently pregnant 10/10/2013  . DYSLIPIDEMIA 05/07/2010  . HEMORRHOIDS  08/22/2009    To Do: 1.   [ ]  Vaccines: Flu: dec Tdap: recd [ ]  BCM: desires BTL [ ]  Readiness: baby has a place to sleep, car seat, other baby necessities.  Edu: [ x] TL precautions; [ ]  BF class; [ ]  childbirth class; [ ]   BF counseling;  Reactive NST

## 2014-03-16 NOTE — Progress Notes (Signed)
U/S scheduled today with MFM at 215 pm. [facility]. Patient advised to eat a light breakfast and take her medication per Dr. Penne LashLeggett.

## 2014-03-21 ENCOUNTER — Inpatient Hospital Stay (HOSPITAL_COMMUNITY): Payer: Medicaid Other | Admitting: Anesthesiology

## 2014-03-21 ENCOUNTER — Inpatient Hospital Stay (HOSPITAL_COMMUNITY)
Admission: AD | Admit: 2014-03-21 | Discharge: 2014-03-22 | DRG: 775 | Disposition: A | Discharge status: Home / Self Care | Payer: Medicaid Other | Source: Ambulatory Visit | Attending: Obstetrics & Gynecology | Admitting: Obstetrics & Gynecology

## 2014-03-21 ENCOUNTER — Encounter (HOSPITAL_COMMUNITY): Payer: Medicaid Other | Admitting: Anesthesiology

## 2014-03-21 ENCOUNTER — Encounter (HOSPITAL_COMMUNITY): Payer: Self-pay | Admitting: *Deleted

## 2014-03-21 DIAGNOSIS — B951 Streptococcus, group B, as the cause of diseases classified elsewhere: Secondary | ICD-10-CM

## 2014-03-21 DIAGNOSIS — O99814 Abnormal glucose complicating childbirth: Secondary | ICD-10-CM

## 2014-03-21 DIAGNOSIS — Z8632 Personal history of gestational diabetes: Secondary | ICD-10-CM

## 2014-03-21 DIAGNOSIS — O0940 Supervision of pregnancy with grand multiparity, unspecified trimester: Secondary | ICD-10-CM

## 2014-03-21 DIAGNOSIS — O9989 Other specified diseases and conditions complicating pregnancy, childbirth and the puerperium: Secondary | ICD-10-CM

## 2014-03-21 DIAGNOSIS — Z302 Encounter for sterilization: Secondary | ICD-10-CM

## 2014-03-21 DIAGNOSIS — Z22330 Carrier of Group B streptococcus: Secondary | ICD-10-CM

## 2014-03-21 DIAGNOSIS — O99810 Abnormal glucose complicating pregnancy: Secondary | ICD-10-CM

## 2014-03-21 DIAGNOSIS — O24419 Gestational diabetes mellitus in pregnancy, unspecified control: Secondary | ICD-10-CM

## 2014-03-21 DIAGNOSIS — O9902 Anemia complicating childbirth: Secondary | ICD-10-CM | POA: Diagnosis present

## 2014-03-21 DIAGNOSIS — D649 Anemia, unspecified: Secondary | ICD-10-CM | POA: Diagnosis present

## 2014-03-21 DIAGNOSIS — O09299 Supervision of pregnancy with other poor reproductive or obstetric history, unspecified trimester: Secondary | ICD-10-CM

## 2014-03-21 DIAGNOSIS — O09899 Supervision of other high risk pregnancies, unspecified trimester: Secondary | ICD-10-CM

## 2014-03-21 DIAGNOSIS — Z3490 Encounter for supervision of normal pregnancy, unspecified, unspecified trimester: Secondary | ICD-10-CM

## 2014-03-21 DIAGNOSIS — O0990 Supervision of high risk pregnancy, unspecified, unspecified trimester: Secondary | ICD-10-CM

## 2014-03-21 DIAGNOSIS — O2340 Unspecified infection of urinary tract in pregnancy, unspecified trimester: Secondary | ICD-10-CM

## 2014-03-21 DIAGNOSIS — O99892 Other specified diseases and conditions complicating childbirth: Secondary | ICD-10-CM

## 2014-03-21 LAB — CBC
HCT: 30.7 % — ABNORMAL LOW (ref 36.0–46.0)
HEMOGLOBIN: 9.2 g/dL — AB (ref 12.0–15.0)
MCH: 19.9 pg — ABNORMAL LOW (ref 26.0–34.0)
MCHC: 30 g/dL (ref 30.0–36.0)
MCV: 66.5 fL — ABNORMAL LOW (ref 78.0–100.0)
PLATELETS: 173 10*3/uL (ref 150–400)
RBC: 4.62 MIL/uL (ref 3.87–5.11)
RDW: 18.6 % — ABNORMAL HIGH (ref 11.5–15.5)
WBC: 7.7 10*3/uL (ref 4.0–10.5)

## 2014-03-21 LAB — GLUCOSE, CAPILLARY
GLUCOSE-CAPILLARY: 80 mg/dL (ref 70–99)
Glucose-Capillary: 87 mg/dL (ref 70–99)

## 2014-03-21 LAB — TYPE AND SCREEN
ABO/RH(D): A POS
ANTIBODY SCREEN: NEGATIVE

## 2014-03-21 LAB — RPR: RPR: NONREACTIVE

## 2014-03-21 MED ORDER — EPHEDRINE 5 MG/ML INJ
INTRAVENOUS | Status: AC
  Filled 2014-03-21: qty 4

## 2014-03-21 MED ORDER — FERROUS SULFATE 325 (65 FE) MG PO TABS
325.0000 mg | ORAL_TABLET | Freq: Two times a day (BID) | ORAL | Status: DC
  Administered 2014-03-22: 325 mg via ORAL
  Filled 2014-03-21 (×2): qty 1

## 2014-03-21 MED ORDER — ZOLPIDEM TARTRATE 5 MG PO TABS: 5.0000 mg | ORAL_TABLET | Freq: Every evening | ORAL | Status: DC | PRN

## 2014-03-21 MED ORDER — SIMETHICONE 80 MG PO CHEW: 80.0000 mg | CHEWABLE_TABLET | ORAL | Status: DC | PRN

## 2014-03-21 MED ORDER — LANOLIN HYDROUS EX OINT: TOPICAL_OINTMENT | CUTANEOUS | Status: DC | PRN

## 2014-03-21 MED ORDER — ONDANSETRON HCL 4 MG/2ML IJ SOLN: 4.0000 mg | Freq: Four times a day (QID) | INTRAMUSCULAR | Status: DC | PRN

## 2014-03-21 MED ORDER — PHENYLEPHRINE 40 MCG/ML (10ML) SYRINGE FOR IV PUSH (FOR BLOOD PRESSURE SUPPORT)
PREFILLED_SYRINGE | INTRAVENOUS | Status: AC
  Filled 2014-03-21: qty 10

## 2014-03-21 MED ORDER — OXYCODONE HCL 5 MG PO TABS
5.0000 mg | ORAL_TABLET | ORAL | Status: DC | PRN
  Administered 2014-03-21 – 2014-03-22 (×2): 5 mg via ORAL
  Filled 2014-03-21 (×2): qty 1

## 2014-03-21 MED ORDER — BENZOCAINE-MENTHOL 20-0.5 % EX AERO
1.0000 "application " | INHALATION_SPRAY | CUTANEOUS | Status: DC | PRN
  Filled 2014-03-21 (×2): qty 56

## 2014-03-21 MED ORDER — LIDOCAINE HCL (PF) 1 % IJ SOLN
30.0000 mL | INTRAMUSCULAR | Status: DC | PRN
  Filled 2014-03-21: qty 30

## 2014-03-21 MED ORDER — ONDANSETRON HCL 4 MG PO TABS: 4.0000 mg | ORAL_TABLET | ORAL | Status: DC | PRN

## 2014-03-21 MED ORDER — TETANUS-DIPHTH-ACELL PERTUSSIS 5-2.5-18.5 LF-MCG/0.5 IM SUSP: 0.5000 mL | Freq: Once | INTRAMUSCULAR | Status: DC

## 2014-03-21 MED ORDER — OXYTOCIN 40 UNITS IN LACTATED RINGERS INFUSION - SIMPLE MED
62.5000 mL/h | INTRAVENOUS | Status: DC
  Filled 2014-03-21: qty 1000

## 2014-03-21 MED ORDER — PRENATAL MULTIVITAMIN CH
1.0000 | ORAL_TABLET | Freq: Every day | ORAL | Status: DC
  Administered 2014-03-22: 1 via ORAL
  Filled 2014-03-21: qty 1

## 2014-03-21 MED ORDER — DIBUCAINE 1 % RE OINT
1.0000 "application " | TOPICAL_OINTMENT | RECTAL | Status: DC | PRN
  Filled 2014-03-21: qty 28

## 2014-03-21 MED ORDER — IBUPROFEN 600 MG PO TABS
600.0000 mg | ORAL_TABLET | Freq: Four times a day (QID) | ORAL | Status: DC
  Administered 2014-03-22 (×3): 600 mg via ORAL
  Filled 2014-03-21 (×4): qty 1

## 2014-03-21 MED ORDER — FENTANYL 2.5 MCG/ML BUPIVACAINE 1/10 % EPIDURAL INFUSION (WH - ANES)
INTRAMUSCULAR | Status: AC
  Filled 2014-03-21: qty 125

## 2014-03-21 MED ORDER — OXYCODONE HCL 5 MG PO TABS: 5.0000 mg | ORAL_TABLET | ORAL | Status: DC | PRN

## 2014-03-21 MED ORDER — OXYTOCIN 40 UNITS IN LACTATED RINGERS INFUSION - SIMPLE MED
1.0000 m[IU]/min | INTRAVENOUS | Status: DC
  Administered 2014-03-21: 2 m[IU]/min via INTRAVENOUS

## 2014-03-21 MED ORDER — WITCH HAZEL-GLYCERIN EX PADS: 1.0000 "application " | MEDICATED_PAD | CUTANEOUS | Status: DC | PRN

## 2014-03-21 MED ORDER — OXYTOCIN BOLUS FROM INFUSION: 500.0000 mL | INTRAVENOUS | Status: DC

## 2014-03-21 MED ORDER — PENICILLIN G POTASSIUM 5000000 UNITS IJ SOLR
2.5000 10*6.[IU] | INTRAVENOUS | Status: DC
  Administered 2014-03-21: 2.5 10*6.[IU] via INTRAVENOUS
  Filled 2014-03-21 (×4): qty 2.5

## 2014-03-21 MED ORDER — IBUPROFEN 600 MG PO TABS
600.0000 mg | ORAL_TABLET | Freq: Four times a day (QID) | ORAL | Status: DC | PRN
  Administered 2014-03-21: 600 mg via ORAL

## 2014-03-21 MED ORDER — PENICILLIN G POTASSIUM 5000000 UNITS IJ SOLR
5.0000 10*6.[IU] | Freq: Once | INTRAVENOUS | Status: AC
  Administered 2014-03-21: 5 10*6.[IU] via INTRAVENOUS
  Filled 2014-03-21: qty 5

## 2014-03-21 MED ORDER — SENNOSIDES-DOCUSATE SODIUM 8.6-50 MG PO TABS
2.0000 | ORAL_TABLET | ORAL | Status: DC
  Administered 2014-03-22: 2 via ORAL
  Filled 2014-03-21: qty 2

## 2014-03-21 MED ORDER — CITRIC ACID-SODIUM CITRATE 334-500 MG/5ML PO SOLN: 30.0000 mL | ORAL | Status: DC | PRN

## 2014-03-21 MED ORDER — LACTATED RINGERS IV SOLN: INTRAVENOUS | Status: DC

## 2014-03-21 MED ORDER — LACTATED RINGERS IV SOLN: 500.0000 mL | INTRAVENOUS | Status: DC | PRN

## 2014-03-21 MED ORDER — FLEET ENEMA 7-19 GM/118ML RE ENEM: 1.0000 | ENEMA | RECTAL | Status: DC | PRN

## 2014-03-21 MED ORDER — ONDANSETRON HCL 4 MG/2ML IJ SOLN: 4.0000 mg | INTRAMUSCULAR | Status: DC | PRN

## 2014-03-21 NOTE — H&P (Signed)
Kimberly Avila is a 33 y.o. female presenting for induction of labor for A2 gestational diabetes.  Pt in High Risk clinic with pregnancy dated by LMP and consistent with 20 week ultrasound.  No other complications identified.  Maternal Medical History:  Contractions: Onset was 3-5 hours ago.    Fetal activity: Perceived fetal activity is normal.    Prenatal complications: Gestational Diabetes    OB History   Grav Para Term Preterm Abortions TAB SAB Ect Mult Living   6 5 5       5      Past Medical History  Diagnosis Date  . No pertinent past medical history   . Gestational diabetes     glyburide   Past Surgical History  Procedure Laterality Date  . No past surgeries     Family History: family history includes Diabetes in her maternal uncle. Social History:  reports that she has never smoked. She has never used smokeless tobacco. She reports that she does not drink alcohol or use illicit drugs.   Review of Systems  Gastrointestinal: Positive for abdominal pain (contractions).  All other systems reviewed and are negative.    Dilation: 3 Effacement (%): 50 Station: -2 Exam by:: muhhamed, cnm Blood pressure 114/63, pulse 75, temperature 98 F (36.7 C), temperature source Oral, resp. rate 16, height 5' (1.524 m), weight 78.472 kg (173 lb), last menstrual period 06/21/2013. Maternal Exam:  Uterine Assessment: Contraction strength is moderate.  Contraction frequency is irregular.   Abdomen: Estimated fetal weight is 7-7.5lbs.   Fetal presentation: vertex  Introitus: Vagina is positive for vaginal discharge (mucusy).    Fetal Exam Fetal Monitor Review: Baseline rate: 150's.  Variability: moderate (6-25 bpm).   Pattern: accelerations present.    Fetal State Assessment: Category I - tracings are normal.     Physical Exam  Constitutional: She is oriented to person, place, and time. She appears well-developed and well-nourished. No distress.  HENT:  Head:  Normocephalic.  Neck: Normal range of motion. Neck supple.  Cardiovascular: Normal rate, regular rhythm and normal heart sounds.   Respiratory: Effort normal and breath sounds normal.  GI: Soft. There is no tenderness.  Genitourinary: No bleeding around the vagina. Vaginal discharge (mucusy) found.  Neurological: She is alert and oriented to person, place, and time.  Skin: Skin is warm and dry.    Prenatal labs: ABO, Rh: A/POS/-- (10/20 1208) Antibody: NEG (10/20 1208) Rubella: 6.08 (10/20 1208) RPR: NON REAC (01/12 1017)  HBsAg: NEGATIVE (10/20 1208)  HIV: NON REACTIVE (10/20 1208)  GBS:   Pos in urine  Assessment/Plan: 32 yo G6P5005 at 5517w0d wks IUP A2 Diabetic - Glyburide GBS Pos - Urine (Nov 2014)  [facility]: [facility] Begin Pitocin PCN for GBS prophylaxis Anticipate NSVD    [facility],Jude Linck 03/21/2014, 10:45 AM

## 2014-03-21 NOTE — H&P (Signed)
Attestation of Attending Supervision of Advanced Practitioner (PA/CNM/NP): Evaluation and management procedures were performed by the Advanced Practitioner under my supervision and collaboration.  I have reviewed the Advanced Practitioner's note and chart, and I agree with the management and plan.  Ruston Fedora, MD, FACOG Attending Obstetrician & Gynecologist Faculty Practice, [facility]  

## 2014-03-21 NOTE — Progress Notes (Signed)
   Subjective: Declines pain meds (per RN).  Objective: BP 100/65  Pulse 66  Temp(Src) 98 F (36.7 C) (Oral)  Resp 16  Ht 5' (1.524 m)  Wt 78.472 kg (173 lb)  BMI 33.79 kg/m2  LMP 06/21/2013      FHT:  FHR: 140's bpm, variability: moderate,  accelerations:  Present,  decelerations:  Absent UC:   irregular, every 2-10 minutes SVE:   Dilation: 3 Effacement (%): 50 Station: -2 Exam by:: muhhamed, cnm Pitocin 10 mu/min Labs: Lab Results  Component Value Date   WBC 7.7 03/21/2014   HGB 9.2* 03/21/2014   HCT 30.7* 03/21/2014   MCV 66.5* 03/21/2014   PLT 173 03/21/2014    Assessment / Plan: Augmentation of Labor  Labor: Augmentation of Labor Preeclampsia:  n/a Fetal Wellbeing:  Category I Pain Control:  Labor support without medications I/D:  GBS pos Anticipated MOD:  NSVD  [facility]UH[facility]AD,Kimberly Avila 03/21/2014, 2:39 PM

## 2014-03-21 NOTE — Lactation Note (Signed)
This note was copied from the chart of Kimberly Renelda LomaMaria Bidwell. Lactation Consultation Note SpEncouraged comfort during BF so colostrum flows better and mom will enjoy the feeding longer. Taking deep breaths and breast massage during BF. Encouraged to call for assistance if needed and to verify proper latch.anish speaking, speaks some [place]. Had family that spoke good [place] and was able to translate. [facility] given and encourage to look at Spanish Baby and Me book at pg. 13-16 for Breastfeeding information and proper latch picture. Assisted mom in latch, noted baby rooting. Baby has elevated temp. 100.5 w/one blanket. Encouraged mom not to feed baby wrapped in blankets, to nurse STS. Mom put baby to breast football position. Stated that baby hurts getting on, bites. Noted baby not opening wide and clamping down on nipple. Instructed mom and dad chin tug to obtain deeper latch. Demonstrated proper flang. States feels better.  Patient Name: Kimberly Avila ZOXWR'UToday's Date: 03/21/2014 Reason for consult: Initial assessment   Maternal Data Does the patient have breastfeeding experience prior to this delivery?: Yes  Feeding Feeding Type: Breast Fed Length of feed: 10 min  LATCH Score/Interventions Latch: Repeated attempts needed to sustain latch, nipple held in mouth throughout feeding, stimulation needed to elicit sucking reflex. Intervention(s): Adjust position;Assist with latch;Breast compression  Audible Swallowing: A few with stimulation Intervention(s): Skin to skin Intervention(s): Skin to skin  Type of Nipple: Everted at rest and after stimulation  Comfort (Breast/Nipple): Filling, red/small blisters or bruises, mild/mod discomfort (chin tug needed)  Problem noted: Mild/Moderate discomfort (baby biting/not opening wide/no redness noted) Interventions (Mild/moderate discomfort):  (rub colostrum to nipples)  Hold (Positioning): Assistance needed to correctly position  infant at breast and maintain latch. Intervention(s): Breastfeeding basics reviewed;Skin to skin  LATCH Score: 6  Lactation Tools Discussed/Used     Consult Status Consult Status: Follow-up Date: 03/22/14 Follow-up type: In-patient    Charyl DancerCARVER, Ellyn Rubiano G 03/21/2014, 9:55 PM

## 2014-03-21 NOTE — Progress Notes (Addendum)
   Subjective: Uncertain regarding epidural.  Objective: BP 105/67  Pulse 68  Temp(Src) 98 F (36.7 C) (Oral)  Resp 16  Ht 5' (1.524 m)  Wt 78.472 kg (173 lb)  BMI 33.79 kg/m2  LMP 06/21/2013      FHT:  FHR: 150's bpm, variability: moderate,  accelerations:  Present,  decelerations:  Present early UC:   regular, every 2-4 minutes SVE:   Dilation: 5 Effacement (%): 50 Station: -2 Exam by:: muhammad, cnm AROM clear fluid  Labs: Lab Results  Component Value Date   WBC 7.7 03/21/2014   HGB 9.2* 03/21/2014   HCT 30.7* 03/21/2014   MCV 66.5* 03/21/2014   PLT 173 03/21/2014    Assessment / Plan: Augmentation of labor, progressing well  Labor: Progressing normally Preeclampsia:  n/a Fetal Wellbeing:  Category I Pain Control:  Labor support without medications I/D:  GBS pos Anticipated MOD:  NSVD  [facility]UHAMMAD,Maile Linford 03/21/2014, 4:12 PM

## 2014-03-21 NOTE — Anesthesia Preprocedure Evaluation (Signed)
Anesthesia Evaluation  Patient identified by MRN, date of birth, ID band Patient awake    Reviewed: Allergy & Precautions, H&P , Patient's Chart, lab work & pertinent test results  Airway Mallampati: II TM Distance: >3 FB Neck ROM: full    Dental  (+) Teeth Intact   Pulmonary  breath sounds clear to auscultation        Cardiovascular Rhythm:regular Rate:Normal     Neuro/Psych    GI/Hepatic   Endo/Other  diabetes, Gestational  Renal/GU      Musculoskeletal   Abdominal   Peds  Hematology  (+) anemia ,   Anesthesia Other Findings       Reproductive/Obstetrics (+) Pregnancy                           Anesthesia Physical Anesthesia Plan  ASA: II  Anesthesia Plan: Epidural   Post-op Pain Management:    Induction:   Airway Management Planned:   Additional Equipment:   Intra-op Plan:   Post-operative Plan:   Informed Consent: I have reviewed the patients History and Physical, chart, labs and discussed the procedure including the risks, benefits and alternatives for the proposed anesthesia with the patient or authorized representative who has indicated his/her understanding and acceptance.   Dental Advisory Given  Plan Discussed with:   Anesthesia Plan Comments: (Labs checked- platelets confirmed with RN in room. Fetal heart tracing, per RN, reported to be stable enough for sitting procedure. Discussed epidural, and patient consents to the procedure:  included risk of possible headache,backache, failed block, allergic reaction, and nerve injury. This patient was asked if she had any questions or concerns before the procedure started.)        Anesthesia Quick Evaluation

## 2014-03-22 ENCOUNTER — Inpatient Hospital Stay (HOSPITAL_COMMUNITY): Admission: RE | Admit: 2014-03-22 | Payer: Medicaid Other | Source: Ambulatory Visit

## 2014-03-22 ENCOUNTER — Ambulatory Visit (INDEPENDENT_AMBULATORY_CARE_PROVIDER_SITE_OTHER): Payer: Medicaid Other

## 2014-03-22 VITALS — BP 109/67 | HR 72 | Temp 97.3°F | Wt 163.2 lb

## 2014-03-22 DIAGNOSIS — Z3049 Encounter for surveillance of other contraceptives: Secondary | ICD-10-CM

## 2014-03-22 LAB — CBC
HCT: 25.5 % — ABNORMAL LOW (ref 36.0–46.0)
HEMOGLOBIN: 7.7 g/dL — AB (ref 12.0–15.0)
MCH: 20.2 pg — AB (ref 26.0–34.0)
MCHC: 30.2 g/dL (ref 30.0–36.0)
MCV: 66.9 fL — ABNORMAL LOW (ref 78.0–100.0)
PLATELETS: 157 10*3/uL (ref 150–400)
RBC: 3.81 MIL/uL — ABNORMAL LOW (ref 3.87–5.11)
RDW: 18.6 % — ABNORMAL HIGH (ref 11.5–15.5)
WBC: 12 10*3/uL — ABNORMAL HIGH (ref 4.0–10.5)

## 2014-03-22 MED ORDER — IBUPROFEN 600 MG PO TABS: 600.0000 mg | ORAL_TABLET | Freq: Four times a day (QID) | ORAL | Status: DC

## 2014-03-22 MED ORDER — MEDROXYPROGESTERONE ACETATE 104 MG/0.65ML ~~LOC~~ SUSP
104.0000 mg | Freq: Once | SUBCUTANEOUS | Status: AC
  Administered 2014-03-22: 104 mg via SUBCUTANEOUS

## 2014-03-22 NOTE — Progress Notes (Signed)
Ur chart review completed.  

## 2014-03-22 NOTE — Discharge Summary (Signed)
Obstetric Discharge Summary Reason for Admission: induction of labor Prenatal Procedures: NST Intrapartum Procedures: spontaneous vaginal delivery Postpartum Procedures: none Complications-Operative and Postpartum: none Hemoglobin  Date Value Ref Range Status  03/22/2014 7.7* 12.0 - 15.0 g/dL Final     HCT  Date Value Ref Range Status  03/22/2014 25.5* 36.0 - 46.0 % Final    Discharge Diagnoses: Term Pregnancy-delivered  Hospital Course:  Kimberly Avila is a 33 y.o. [id number] who presented for induction of labor, GBS positive adequately treated with x 2 doses PCN.  She had a uncomplicated SVD on same day. She was able to ambulate, tolerate PO and void normally. She was discharged home with instructions for postpartum care. [facility], with plans for Nexplanon. Pt will need PP testing for diabetes  Delivery Note  At 4:47 PM a viable and healthy female was delivered via Vaginal, Spontaneous Delivery (Presentation: ; ). APGAR: 10, 10; weight pending.  Placenta status: Intact, Spontaneous. Cord: 3 vessels with the following complications: None. Cord pH: n/a  Anesthesia: None  Episiotomy: None  Lacerations: None  Est. Blood Loss (mL): 250  Mom to postpartum. Baby to Couplet care / Skin to Skin.  [facility]ly  03/21/2014, 5:05 PM   Physical Exam:  General: alert and cooperative, well-appearing Lochia: appropriate Uterine Fundus: firm DVT Evaluation: No evidence of DVT seen on physical exam. Negative Homan's sign. No cords or calf tenderness.  Discharge Information: Date: 03/22/2014 Activity: unrestricted Diet: routine Medications: PNV, Ibuprofen, Iron and Keflex (complete course on 03/26/14) Baby feeding: plans to breastfeed Contraception: Nexplanon to be placed in clinic. Condition: stable Instructions: refer to practice specific booklet Discharge to: home Follow-up Information   [facility]. Schedule an appointment as soon as  possible for a visit in 4 weeks.   Specialty:  Obstetrics and Gynecology   Contact information:   [address] [place] [id number] [phone]      Newborn Data: Live born female  Birth Weight: 7 lb 12 oz (3515 g) APGAR: 10, 10  Home with mother pending discharge from nursery.  Kimberly PilarAlexander Karamalegos, DO [facility], PGY-1 03/22/2014, 9:33 AM

## 2014-03-22 NOTE — Progress Notes (Signed)
Pt. Here today for depo provera injection. Delivered on 03/21/14. No UPT necessary. Depo-provera 104mg  administered; pt. Tolerated well. Pt. Instructed to make appointment to return for second injection between 6/23-7/8.

## 2014-03-22 NOTE — Discharge Summary (Signed)
Attestation of Attending Supervision of Advanced Practitioner (CNM/NP): Evaluation and management procedures were performed by the Advanced Practitioner under my supervision and collaboration.  I have reviewed the Advanced Practitioner's note and chart, and I agree with the management and plan.  HARRAWAY-SMITH, Nickalos Petersen 2:20 PM

## 2014-03-22 NOTE — Discharge Instructions (Signed)
Cuidados en el postparto luego de un parto vaginal  (Postpartum Care After Vaginal Delivery) Despus del parto (perodo de postparto), la estada normal en el hospital es de 24-72 horas. Si hubo problemas con el trabajo de parto o el parto, o si tiene otros problemas mdicos, es posible que Patent attorney en el hospital por ms [place].  Mientras est en el hospital, recibir [place] e instrucciones sobre cmo cuidar de usted misma y de su beb recin nacido durante el postparto.  Mientras est en el hospital:   Asegrese de decirle a las enfermeras si siente dolor o Tree surgeon, as como donde Designer, television/film set y Architect.  Si usted tuvo una incisin cerca de la vagina (episiotoma) o si ha tenido Education officer, museum, las enfermeras le pondrn hielo sobre la episiotoma o Psychiatrist. Las bolsas de hielo pueden ayudar a Dietitian y la hinchazn.  Si est amamantando, puede sentir contracciones dolorosas en el tero durante algunas semanas. Esto es normal. Las contracciones ayudan a que el tero vuelva a su tamao normal.  Es normal tener algo de sangrado despus del [place].  Durante los primeros 1-3 das despus del parto, el flujo es de color rojo y la cantidad puede ser similar a un perodo.  Es frecuente que el flujo se inicie y se Production assistant, radio.  En los primeros [place], puede eliminar algunos cogulos pequeos. Informe a las enfermeras si elimina cogulos grandes o aumenta el flujo.  No  elimine los cogulos de sangre por el inodoro antes de que la [employer] vea.  Durante los prximos 3 a [address] despus del parto, el flujo debe ser ms acuoso y rosado o Forensic psychologist.  Chancy Hurter a catorce [employer] del parto, el flujo debe ser una pequea cantidad de secrecin de color blanco amarillento.  La cantidad de flujo disminuir en las primeras semanas despus del parto. El flujo puede detenerse en 6-8 semanas. La mayora de las mujeres no tienen ms flujo a las 12 semanas  despus del [place].  Usted debe cambiar sus apsitos con frecuencia.  Lvese bien las manos con agua y jabn durante al menos 20 segundos despus de cambiar el apsito, usar el bao o antes de Nature conservation officer o Research scientist (life sciences) a su recin nacido.  Usted podr sentir como que tiene que vaciar la vejiga durante las primeras 6-8 horas despus del [place].  En caso de que sienta debilidad, mareo o [place], llame a la enfermera antes de levantarse de la cama por primera vez y antes de tomar una ducha por primera vez.  Dentro de los [employer] del parto, sus mamas pueden comenzar a estar sensibles y [place]. Esto se llama congestin. La sensibilidad en los senos por lo general desaparece dentro de las 48-72 horas despus de que ocurre la congestin. Tambin puede notar que la [place] se escapa de sus senos. Si no est amamantando no estimule sus pechos. La estimulacin de las mamas hace que sus senos produzcan ms [place].  Pasar tanto tiempo como le sea posible con el beb recin nacido es muy importante. Durante ese tiempo, usted y su beb deben sentirse cerca y conocerse uno al otro. Tener al beb en su habitacin (alojamiento conjunto) ayudar a fortalecer el vnculo con el beb recin nacido.Esto le dar tiempo para conocerlo y atenderlo de [place] cmoda.  Las hormonas se modifican despus del parto. A veces, los cambios hormonales pueden causar tristeza o ganas de llorar por un tiempo. Estos sentimientos  no deben durar ms de unos pocos das. Si duran ms que eso, debe hablar con su mdico.  Si lo desea, hable con su mdico acerca de los mtodos de planificacin familiar o mtodos anticonceptivos.  Hable con su mdico acerca de las vacunas. El mdico puede indicarle que se aplique las siguientes vacunas antes de salir del hospital:  Vacuna contra el ttanos, la difteria y la tos ferina (Tdap) o el ttanos y la difteria (Td). Es muy importante que usted y su familia (incluyendo a los abuelos) u otras  personas que cuidan al recin nacido estn al da con las vacunas Tdap o Td. Las vacunas Tdap o Td pueden ayudar a proteger al recin nacido de enfermedades.  Inmunizacin contra la rubola.  Inmunizacin contra la varicela.  Inmunizacin contra la gripe. Usted debe recibir esta vacunacin anual si no la ha recibido durante el embarazo. Document Released: 10/05/2007 Document Revised: 09/01/2012 ExitCare Patient Information 2014 ExitCare, LLC.  

## 2014-04-19 ENCOUNTER — Encounter: Payer: Self-pay | Admitting: Obstetrics & Gynecology

## 2014-04-19 ENCOUNTER — Ambulatory Visit (INDEPENDENT_AMBULATORY_CARE_PROVIDER_SITE_OTHER): Payer: Medicaid Other | Admitting: Obstetrics & Gynecology

## 2014-04-19 NOTE — Progress Notes (Signed)
   Subjective:    Patient ID: Kimberly Avila, female    DOB: Sep 05, 1981, 33 y.o.   MRN: [id number]  HPI  33 yo H P6 here at 3 1/2 weeks PP s/p NSVD for a pp visit. She has no complaints. She received a depo provera injection 03/22/14 and has another appt on 06-13-14 for the next shot. She may go to the health dept and get Nexplanon in the meantime. She has no complaints. She scored 0 on the depression scale. She has not had sex yet. Her daughter is sleeping well, breastfeeding.   Review of Systems She had no tears with delivery    Objective:   Physical Exam   deferred     Assessment & Plan:  PP- doing well Contraception- as above GDM- RTC fasting for 2 hour GTT

## 2014-05-05 ENCOUNTER — Ambulatory Visit: Payer: Self-pay

## 2014-05-05 ENCOUNTER — Other Ambulatory Visit: Payer: Self-pay

## 2014-05-17 ENCOUNTER — Encounter: Payer: Self-pay | Admitting: *Deleted

## 2014-05-19 ENCOUNTER — Emergency Department (INDEPENDENT_AMBULATORY_CARE_PROVIDER_SITE_OTHER)
Admission: EM | Admit: 2014-05-19 | Discharge: 2014-05-19 | Disposition: A | Discharge status: Home / Self Care | Payer: Medicaid Other | Source: Home / Self Care | Attending: Emergency Medicine | Admitting: Emergency Medicine

## 2014-05-19 DIAGNOSIS — N3090 Cystitis, unspecified without hematuria: Secondary | ICD-10-CM

## 2014-05-19 LAB — POCT URINALYSIS DIP (DEVICE)
Bilirubin Urine: NEGATIVE
Glucose, UA: NEGATIVE mg/dL
KETONES UR: NEGATIVE mg/dL
Nitrite: NEGATIVE
PROTEIN: NEGATIVE mg/dL
SPECIFIC GRAVITY, URINE: 1.015 (ref 1.005–1.030)
UROBILINOGEN UA: 0.2 mg/dL (ref 0.0–1.0)
pH: 6.5 (ref 5.0–8.0)

## 2014-05-19 MED ORDER — CEFTRIAXONE SODIUM 1 G IJ SOLR
INTRAMUSCULAR | Status: AC
  Filled 2014-05-19: qty 10

## 2014-05-19 MED ORDER — LIDOCAINE HCL (PF) 1 % IJ SOLN
INTRAMUSCULAR | Status: AC
  Filled 2014-05-19: qty 5

## 2014-05-19 MED ORDER — CEPHALEXIN 500 MG PO CAPS: 1000.0000 mg | ORAL_CAPSULE | Freq: Three times a day (TID) | ORAL | Status: DC

## 2014-05-19 MED ORDER — CEFTRIAXONE SODIUM 1 G IJ SOLR
1.0000 g | Freq: Once | INTRAMUSCULAR | Status: AC
  Administered 2014-05-19: 1 g via INTRAMUSCULAR

## 2014-05-19 NOTE — ED Notes (Signed)
C/o UTI States she has pain when urinating and burn sensation No tx tried.

## 2014-05-19 NOTE — Discharge Instructions (Signed)
  Infeccin urinaria  (Urinary Tract Infection)  La infeccin urinaria puede ocurrir en cualquier lugar del tracto urinario. El tracto urinario es un sistema de drenaje del cuerpo por el que se eliminan los desechos y el exceso de agua. El tracto urinario est formado por dos riones, dos urteres, la vejiga y la uretra. Los riones son rganos que tienen forma de frijol. Cada rin tiene aproximadamente el tamao del puo. Estn situados debajo de las costillas, uno a cada lado de la columna vertebral CAUSAS  La causa de la infeccin son los microbios, que son organismos microscpicos, que incluyen hongos, virus, y bacterias. Estos organismos son tan pequeos que slo pueden verse a travs del microscopio. Las bacterias son los microorganismos que ms comnmente causan infecciones urinarias.  SNTOMAS  Los sntomas pueden variar segn la edad y el sexo del paciente y por la ubicacin de la infeccin. Los sntomas en las mujeres jvenes incluyen la necesidad frecuente e intensa de orinar y una sensacin dolorosa de ardor en la vejiga o en la uretra durante la miccin. Las mujeres y los hombres mayores podrn sentir cansancio, temblores y debilidad y sentir dolores musculares y dolor abdominal. Si tiene fiebre, puede significar que la infeccin est en los riones. Otros sntomas son dolor en la espalda o en los lados debajo de las costillas, nuseas y vmitos.  DIAGNSTICO  Para diagnosticar una infeccin urinaria, el mdico le preguntar acerca de sus sntomas. Tambin le solicitar una muestra de orina. La muestra de orina se analiza para detectar bacterias y glbulos blancos de la sangre. Los glbulos blancos se forman en el organismo para ayudar a combatir las infecciones.  TRATAMIENTO  Por lo general, las infecciones urinarias pueden tratarse con medicamentos. Debido a que la mayora de las infecciones son causadas por bacterias, por lo general pueden tratarse con antibiticos. La eleccin del  antibitico y la duracin del tratamiento depender de sus sntomas y el tipo de bacteria causante de la infeccin.  INSTRUCCIONES PARA EL CUIDADO EN EL HOGAR   Si le recetaron antibiticos, tmelos exactamente como su mdico le indique. Termine el medicamento aunque se sienta mejor despus de haber tomado slo algunos.  Beba gran cantidad de lquido para mantener la orina de tono claro o color amarillo plido.  Evite la cafena, el t y las bebidas gaseosas. Estas sustancias irritan la vejiga.  Vaciar la vejiga con frecuencia. Evite retener la orina durante largos perodos.  Vace la vejiga antes y despus de tener relaciones sexuales.  Despus de mover el intestino, las mujeres deben higienizarse la regin perineal desde adelante hacia atrs. Use slo un papel tissue por vez. SOLICITE ATENCIN MDICA SI:   Siente dolor en la espalda.  Le sube la fiebre.  Los sntomas no mejoran luego de 3 das. SOLICITE ATENCIN MDICA DE INMEDIATO SI:   Siente dolor intenso en la espalda o en la zona inferior del abdomen.  Comienza a sentir escalofros.  Tiene nuseas o vmitos.  Tiene una sensacin continua de quemazn o molestias al orinar. ASEGRESE DE QUE:   Comprende estas instrucciones.  Controlar su enfermedad.  Solicitar ayuda de inmediato si no mejora o empeora. Document Released: 09/17/2005 Document Revised: 09/01/2012 ExitCare Patient Information 2014 ExitCare, LLC.  

## 2014-05-19 NOTE — ED Provider Notes (Signed)
CSN: [id number]     Arrival date & time 05/19/14  1931 History   First MD Initiated Contact with Patient 05/19/14 2031     Chief Complaint  Patient presents with  . Urinary Tract Infection   (Consider location/radiation/quality/duration/timing/severity/associated sxs/prior Treatment) HPI Comments: History of present illness obtained via Spanish interpreter for [employer]  33 year old female who is breast-feeding presents complaining of gross hematuria, dysuria, lower abdominal pain, back pain. This began yesterday with  dysuria, and today her urine became grossly bloody. The back pain has worsened throughout the day. No fever at home. She has no nausea or vomiting. She denies any other systemic symptoms. No previous history of urinary tract infections that she knows of.  Patient is a 33 y.o. female presenting with urinary tract infection.  Urinary Tract Infection    Past Medical History  Diagnosis Date  . No pertinent past medical history   . Gestational diabetes     glyburide   Past Surgical History  Procedure Laterality Date  . No past surgeries     Family History  Problem Relation Age of Onset  . Diabetes Maternal Uncle    History  Substance Use Topics  . Smoking status: Never Smoker   . Smokeless tobacco: Never Used  . Alcohol Use: No   OB History   Grav Para Term Preterm Abortions TAB SAB Ect Mult Living   6 6 6       6      Review of Systems  Genitourinary: Positive for dysuria, urgency, frequency, hematuria and flank pain.  Musculoskeletal: Positive for back pain.  All other systems reviewed and are negative.   Allergies  Gelatin; Nyquil; and Tylenol  Home Medications   Prior to Admission medications   Medication Sig Start Date End Date Taking? Authorizing Provider  cephALEXin (KEFLEX) 500 MG capsule Take 2 capsules (1,000 mg total) by mouth 3 (three) times daily. 05/19/14   Graylon GoodZachary H Paulyne Mooty, PA-C  ferrous sulfate (FERROUSUL) 325 (65 FE) MG tablet  Take 1 tablet (325 mg total) by mouth 2 (two) times daily. 01/30/14   Tereso NewcomerUgonna A Anyanwu, MD  ibuprofen (ADVIL,MOTRIN) 600 MG tablet Take 1 tablet (600 mg total) by mouth every 6 (six) hours. 03/22/14   Saralyn PilarAlexander Karamalegos, DO  Prenatal Vit-Fe Fumarate-FA (PRENATAL MULTIVITAMIN) TABS tablet Take 1 tablet by mouth daily at 12 noon.    Historical Provider, MD   BP 105/64  Pulse 65  Temp(Src) 98.1 F (36.7 C)  Resp 12  SpO2 100% Physical Exam  Nursing note and vitals reviewed. Constitutional: She is oriented to person, place, and time. Vital signs are normal. She appears well-developed and well-nourished. No distress.  HENT:  Head: Normocephalic and atraumatic.  Pulmonary/Chest: Effort normal. No respiratory distress.  Abdominal: Soft. She exhibits no mass. There is no tenderness. There is CVA tenderness (bilateral). There is no rebound and no guarding.  Neurological: She is alert and oriented to person, place, and time. She has normal strength. Coordination normal.  Skin: Skin is warm and dry. No rash noted. She is not diaphoretic.  Psychiatric: She has a normal mood and affect. Judgment normal.    ED Course  Procedures (including critical care time) Labs Review Labs Reviewed  POCT URINALYSIS DIP (DEVICE) - Abnormal; Notable for the following:    Hgb urine dipstick MODERATE (*)    Leukocytes, UA LARGE (*)    All other components within normal limits  URINE CULTURE    Imaging Review No results found.  MDM   1. Cystitis    She has moderate CVA tenderness but is afebrile, we'll give an injection of ceftriaxone and discharged with a high dose of Keflex for one week to cover for ascending pyelonephritis. Emergency department if worsening.   Meds ordered this encounter  Medications  . cefTRIAXone (ROCEPHIN) injection 1 g    Sig:   . cephALEXin (KEFLEX) 500 MG capsule    Sig: Take 2 capsules (1,000 mg total) by mouth 3 (three) times daily.    Dispense:  42 capsule    Refill:  0     Order Specific Question:  Supervising Provider    Answer:  Lorenz Coaster, DAVID C [[id number]]       Graylon Good, PA-C 05/19/14 2128

## 2014-05-20 NOTE — ED Provider Notes (Signed)
Medical screening examination/treatment/procedure(s) were performed by non-physician practitioner and as supervising physician I was immediately available for consultation/collaboration.  Leslee Home, M.D.   Reuben Likes, MD 05/20/14 [phone]

## 2014-05-21 LAB — URINE CULTURE: Colony Count: >=100000

## 2014-06-13 ENCOUNTER — Ambulatory Visit: Payer: Self-pay

## 2014-06-18 ENCOUNTER — Emergency Department (HOSPITAL_COMMUNITY): Payer: Medicaid Other

## 2014-06-18 ENCOUNTER — Emergency Department (HOSPITAL_COMMUNITY)
Admission: EM | Admit: 2014-06-18 | Discharge: 2014-06-18 | Disposition: A | Discharge status: Home / Self Care | Payer: Medicaid Other | Attending: Emergency Medicine | Admitting: Emergency Medicine

## 2014-06-18 ENCOUNTER — Encounter (HOSPITAL_COMMUNITY): Payer: Self-pay | Admitting: Emergency Medicine

## 2014-06-18 ENCOUNTER — Emergency Department (INDEPENDENT_AMBULATORY_CARE_PROVIDER_SITE_OTHER)
Admission: EM | Admit: 2014-06-18 | Discharge: 2014-06-18 | Disposition: A | Discharge status: Home / Self Care | Payer: Medicaid Other | Source: Home / Self Care | Attending: Emergency Medicine | Admitting: Emergency Medicine

## 2014-06-18 DIAGNOSIS — Z8632 Personal history of gestational diabetes: Secondary | ICD-10-CM | POA: Insufficient documentation

## 2014-06-18 DIAGNOSIS — Z79899 Other long term (current) drug therapy: Secondary | ICD-10-CM | POA: Insufficient documentation

## 2014-06-18 DIAGNOSIS — K8020 Calculus of gallbladder without cholecystitis without obstruction: Secondary | ICD-10-CM | POA: Insufficient documentation

## 2014-06-18 DIAGNOSIS — R079 Chest pain, unspecified: Secondary | ICD-10-CM | POA: Insufficient documentation

## 2014-06-18 DIAGNOSIS — R1013 Epigastric pain: Secondary | ICD-10-CM

## 2014-06-18 DIAGNOSIS — Z792 Long term (current) use of antibiotics: Secondary | ICD-10-CM | POA: Insufficient documentation

## 2014-06-18 LAB — CBC WITH DIFFERENTIAL/PLATELET
BASOS ABS: 0 10*3/uL (ref 0.0–0.1)
Basophils Relative: 0 % (ref 0–1)
EOS ABS: 0.1 10*3/uL (ref 0.0–0.7)
Eosinophils Relative: 1 % (ref 0–5)
HCT: 36.3 % (ref 36.0–46.0)
HEMOGLOBIN: 12 g/dL (ref 12.0–15.0)
Lymphocytes Relative: 14 % (ref 12–46)
Lymphs Abs: 1.4 10*3/uL (ref 0.7–4.0)
MCH: 26 pg (ref 26.0–34.0)
MCHC: 33.1 g/dL (ref 30.0–36.0)
MCV: 78.6 fL (ref 78.0–100.0)
MONOS PCT: 4 % (ref 3–12)
Monocytes Absolute: 0.4 10*3/uL (ref 0.1–1.0)
NEUTROS ABS: 8.7 10*3/uL — AB (ref 1.7–7.7)
NEUTROS PCT: 81 % — AB (ref 43–77)
Platelets: 236 10*3/uL (ref 150–400)
RBC: 4.62 MIL/uL (ref 3.87–5.11)
RDW: 17.4 % — ABNORMAL HIGH (ref 11.5–15.5)
WBC: 10.7 10*3/uL — ABNORMAL HIGH (ref 4.0–10.5)

## 2014-06-18 LAB — POCT URINALYSIS DIP (DEVICE)
BILIRUBIN URINE: NEGATIVE
Glucose, UA: NEGATIVE mg/dL
Hgb urine dipstick: NEGATIVE
KETONES UR: NEGATIVE mg/dL
NITRITE: NEGATIVE
Protein, ur: NEGATIVE mg/dL
Specific Gravity, Urine: 1.02 (ref 1.005–1.030)
Urobilinogen, UA: 1 mg/dL (ref 0.0–1.0)
pH: 8.5 — ABNORMAL HIGH (ref 5.0–8.0)

## 2014-06-18 LAB — LIPASE, BLOOD: LIPASE: 25 U/L (ref 11–59)

## 2014-06-18 LAB — TROPONIN I: Troponin I: <0.3 ng/mL (ref <0.30)

## 2014-06-18 LAB — COMPREHENSIVE METABOLIC PANEL
ALBUMIN: 3.9 g/dL (ref 3.5–5.2)
ALT: 74 U/L — AB (ref 0–35)
AST: 45 U/L — AB (ref 0–37)
Alkaline Phosphatase: 74 U/L (ref 39–117)
BILIRUBIN TOTAL: 0.7 mg/dL (ref 0.3–1.2)
BUN: 9 mg/dL (ref 6–23)
CHLORIDE: 105 meq/L (ref 96–112)
CO2: 19 mEq/L (ref 19–32)
Calcium: 8.6 mg/dL (ref 8.4–10.5)
Creatinine, Ser: 0.46 mg/dL — ABNORMAL LOW (ref 0.50–1.10)
GFR calc Af Amer: >90 mL/min (ref >90)
GFR calc non Af Amer: >90 mL/min (ref >90)
Glucose, Bld: 121 mg/dL — ABNORMAL HIGH (ref 70–99)
POTASSIUM: 4.3 meq/L (ref 3.7–5.3)
Sodium: 140 mEq/L (ref 137–147)
TOTAL PROTEIN: 7.1 g/dL (ref 6.0–8.3)

## 2014-06-18 LAB — POCT PREGNANCY, URINE: PREG TEST UR: NEGATIVE

## 2014-06-18 MED ORDER — ONDANSETRON HCL 4 MG/2ML IJ SOLN
4.0000 mg | Freq: Once | INTRAMUSCULAR | Status: AC
  Administered 2014-06-18: 4 mg via INTRAVENOUS
  Filled 2014-06-18: qty 2

## 2014-06-18 MED ORDER — HYDROMORPHONE HCL PF 1 MG/ML IJ SOLN
INTRAMUSCULAR | Status: AC
  Filled 2014-06-18: qty 1

## 2014-06-18 MED ORDER — SODIUM CHLORIDE 0.9 % IV BOLUS (SEPSIS)
1000.0000 mL | Freq: Once | INTRAVENOUS | Status: AC
  Administered 2014-06-18: 1000 mL via INTRAVENOUS

## 2014-06-18 MED ORDER — RANITIDINE HCL 150 MG PO TABS: 150.0000 mg | ORAL_TABLET | Freq: Two times a day (BID) | ORAL | Status: DC

## 2014-06-18 MED ORDER — KETOROLAC TROMETHAMINE 30 MG/ML IJ SOLN
30.0000 mg | Freq: Once | INTRAMUSCULAR | Status: AC
Start: 2014-06-18 — End: 2014-06-18
  Administered 2014-06-18: 30 mg via INTRAVENOUS
  Filled 2014-06-18: qty 1

## 2014-06-18 MED ORDER — HYDROMORPHONE HCL 1 MG/ML IJ SOLN
1.0000 mg | Freq: Once | INTRAMUSCULAR | Status: AC
  Administered 2014-06-18: 1 mg via INTRAVENOUS

## 2014-06-18 MED ORDER — ONDANSETRON 4 MG PO TBDP
4.0000 mg | ORAL_TABLET | Freq: Once | ORAL | Status: AC
  Administered 2014-06-18: 4 mg via ORAL
  Filled 2014-06-18: qty 1

## 2014-06-18 MED ORDER — SODIUM CHLORIDE 0.9 % IV SOLN
Freq: Once | INTRAVENOUS | Status: AC
  Administered 2014-06-18: 13:00:00 via INTRAVENOUS

## 2014-06-18 MED ORDER — ONDANSETRON HCL 4 MG/2ML IJ SOLN
4.0000 mg | Freq: Once | INTRAMUSCULAR | Status: AC
  Administered 2014-06-18: 4 mg via INTRAVENOUS

## 2014-06-18 MED ORDER — HYDROMORPHONE HCL PF 1 MG/ML IJ SOLN
1.0000 mg | Freq: Once | INTRAMUSCULAR | Status: AC
  Administered 2014-06-18: 1 mg via INTRAVENOUS
  Filled 2014-06-18: qty 1

## 2014-06-18 MED ORDER — ONDANSETRON HCL 4 MG PO TABS: 4.0000 mg | ORAL_TABLET | Freq: Four times a day (QID) | ORAL | Status: DC

## 2014-06-18 MED ORDER — ONDANSETRON HCL 4 MG/2ML IJ SOLN
INTRAMUSCULAR | Status: AC
  Filled 2014-06-18: qty 2

## 2014-06-18 MED ORDER — OXYCODONE-ACETAMINOPHEN 5-325 MG PO TABS: 1.0000 | ORAL_TABLET | Freq: Four times a day (QID) | ORAL | Status: DC | PRN

## 2014-06-18 NOTE — ED Provider Notes (Signed)
CSN: [id number]     Arrival date & time 06/18/14  1056 History   First MD Initiated Contact with Patient 06/18/14 1207     Chief Complaint  Patient presents with  . Abdominal Pain  . Chest Pain   (Consider location/radiation/quality/duration/timing/severity/associated sxs/prior Treatment) HPI Comments: Reports that she has episodes of RUQ and epigastric abdominal pain following meal intake that last about 20-30 minutes and resolve spontaneously. These episodes occur every 2-3 weeks for an unknown period of time. She states that she has never had an episode as intense of persistent as today's episode.  LNMP: has Implanon   Patient is a 33 y.o. female presenting with abdominal pain and chest pain. The history is provided by the patient. The history is limited by a language barrier. A language interpreter was used.  Abdominal Pain Pain location:  Epigastric Pain quality: sharp   Pain radiates to:  Back and chest Pain severity:  Severe Onset quality:  Sudden Duration: woke patient from sleep at 4am. Timing:  Constant Progression:  Worsening Associated symptoms: chest pain, nausea and vomiting   Associated symptoms: no constipation and no diarrhea   Chest Pain Associated symptoms: abdominal pain, back pain, nausea and vomiting     Past Medical History  Diagnosis Date  . No pertinent past medical history   . Gestational diabetes     glyburide   Past Surgical History  Procedure Laterality Date  . No past surgeries     Family History  Problem Relation Age of Onset  . Diabetes Maternal Uncle    History  Substance Use Topics  . Smoking status: Never Smoker   . Smokeless tobacco: Never Used  . Alcohol Use: No   OB History   Grav Para Term Preterm Abortions TAB SAB Ect Mult Living   6 6 6       6      Review of Systems  Constitutional: Positive for appetite change.  HENT: Negative.   Eyes: Negative.   Respiratory: Negative.   Cardiovascular: Positive for chest pain.   Gastrointestinal: Positive for nausea, vomiting and abdominal pain. Negative for diarrhea and constipation.  Genitourinary: Negative.   Musculoskeletal: Positive for back pain.  Skin: Negative.   Allergic/Immunologic: Negative for immunocompromised state.  Neurological: Negative.     Allergies  Gelatin; Nyquil; and Tylenol  Home Medications   Prior to Admission medications   Medication Sig Start Date End Date Taking? Authorizing Provider  cephALEXin (KEFLEX) 500 MG capsule Take 2 capsules (1,000 mg total) by mouth 3 (three) times daily. 05/19/14   Graylon GoodZachary H Baker, PA-C  ferrous sulfate (FERROUSUL) 325 (65 FE) MG tablet Take 1 tablet (325 mg total) by mouth 2 (two) times daily. 01/30/14   Tereso NewcomerUgonna A Anyanwu, MD  ibuprofen (ADVIL,MOTRIN) 600 MG tablet Take 1 tablet (600 mg total) by mouth every 6 (six) hours. 03/22/14   Saralyn PilarAlexander Karamalegos, DO  Prenatal Vit-Fe Fumarate-FA (PRENATAL MULTIVITAMIN) TABS tablet Take 1 tablet by mouth daily at 12 noon.    Historical Provider, MD   BP 138/88  Pulse 60  Temp(Src) 98.1 F (36.7 C) (Oral)  Resp 20  SpO2 100% Physical Exam  Nursing note and vitals reviewed. Constitutional: She is oriented to person, place, and time. She appears well-developed and well-nourished. She appears distressed.  Patient is seated on exam table rocking back and forth with intermittent vomiting/gagging.  HENT:  Head: Normocephalic and atraumatic.  Eyes: Conjunctivae are normal.  Cardiovascular: Normal rate, regular rhythm and normal heart sounds.  Pulmonary/Chest: Effort normal and breath sounds normal. No respiratory distress. She has no wheezes.  Abdominal: Normal appearance. She exhibits no mass. Bowel sounds are decreased. There is tenderness in the epigastric area. There is guarding. There is no rigidity, no rebound and no CVA tenderness.  Pain radiates through to midback  Musculoskeletal: Normal range of motion.  Neurological: She is alert and oriented to person,  place, and time.  Skin: Skin is warm and dry. No rash noted. No erythema.  Psychiatric: She has a normal mood and affect. Her behavior is normal.    ED Course  Procedures (including critical care time) Labs Review Labs Reviewed  POCT URINALYSIS DIP (DEVICE) - Abnormal; Notable for the following:    pH 8.5 (*)    Leukocytes, UA SMALL (*)    All other components within normal limits  POCT PREGNANCY, URINE    Imaging Review No results found.   MDM   1. Epigastric pain   Patient appears to have acute abdominal process with active emesis. Suspect biliary colic with +/- associated pancreatitis vs. Severe gastritis. Will provide IV NS, IV zofran 4mg  and [facility] & management.  UA grossly normal. UPT negative. ECG: NSR at 67 bpm with nonspecific T wave changes    Jess BartersJennifer Lee NewmanPresson, GeorgiaPA 06/18/14 1252  Jess BartersJennifer Lee PlainfieldPresson, GeorgiaPA 06/18/14 1253

## 2014-06-18 NOTE — ED Notes (Signed)
Pt still reporting some dizziness upon discharge.  EDP made aware. Feels pt is ok to go home.  Prescription for zofran given.  Pt placed in wheelchair and wheeled out to front.  No signs of nausea or vomiting.

## 2014-06-18 NOTE — ED Notes (Signed)
Pt from [facility] via [facility] with c/o epigastric pain radiating to right flank lasting 20 - 30 mins s/p food consumption.  Pt reports she has had episodes like this in the past but today was worse than usual.  Pt in NAD, A&O, pale.  Given 1 L NS, 4 mg Zofran, and 1 mg Dilaudid.

## 2014-06-18 NOTE — ED Provider Notes (Signed)
Following discharge. Patient noted increasing dizziness and nausea. Patient given Zofran PO but continued to have emesis. Patient was given a fluid bolus and IV Zofran. Continues to complain of dizziness but is ambulatory. The patient is safe for discharge home. Patient primarily seen by Dr. Blinda LeatherwoodPollina and diagnosed with cholelithiasis.  Shon Batonourtney F Horton, MD 06/18/14 2028

## 2014-06-18 NOTE — ED Notes (Signed)
Points to epigastric area as location of pain.  Patient is nauseated and has vomited in treatment room.  Pain goes through to back, when specifically asked about chest, admitted to pain, but not the most painful area.  Reports having had similar episodes but not this severe or lasting this long.

## 2014-06-18 NOTE — Discharge Instructions (Signed)
Llame a la oficina de los mdicos que figuran para conseguir una cita para ser programada para la ciruga de la vescula biliar . Regreso a la sala de emergencia si su dolor empeora.  Colelitiasis (Cholelithiasis) La colelitiasis (tambin llamada clculos en la vescula) es una enfermedad en la que se forman piedras en la vescula. La vescula es un rgano que almacena la bilis que se forma en el hgado y que ayuda a Engineer, agriculturaldigerir grasas. Los clculos comienzan como pequeos cristales y lentamente se transforman en piedras. El dolor en la vescula ocurre cuando se producen espasmos y los clculos obstruyen el conducto. El dolor tambin se produce cuando una piedra sale por el conducto.  FACTORES DE RIESGO  Ser mujer.   Tener embarazos mltiples. Algunas veces los mdicos aconsejan extirpar los clculos biliares antes de futuros embarazos.   Ser obeso.  Dietas que incluyan comidas fritas y grasas.   Ser mayor de 2060 aos y el aumento de la edad.   El uso prolongado de medicamentos que contengan hormonas femeninas.   Tener diabetes mellitus.   Prdida rpida de peso.   Historia familiar de clculos (herencia).  SNTOMAS  Nuseas.   Vmitos.  Dolor abdominal.   Piel amarilla (ictericia)   Dolor sbito. Puede persistir desde algunos minutos hasta algunas horas.  Grant RutsFiebre.   Sensibilidad al tacto. En algunos casos, cuando los clculos biliares no se mueven hacia el conducto biliar, las personas no sienten dolor ni presentan sntomas. Estos se denominan clculos "silenciosos".  TRATAMIENTO Los clculos silenciosos no requieren TEFL teachertratamiento. En los [place] Tool Workscasos graves, podr ser [place]necesaria una ciruga de urgencia. Las opciones de tratamiento son:  Kandis BanCiruga para extirpar la vescula. Es el tratamiento ms frecuente.  Medicamentos. No siempre dan resultado y pueden demorar entre 6 y 12 meses o ms en Scientist, water qualityhacer efecto.  Tratamiento con ondas de choque (litotricia biliar extracorporal). En  este tratamiento, una mquina de ultrasonido enva ondas de choque a la vescula para destruir los clculos en pequeos fragmentos que luego podrn pasar a los intestinos o ser disueltas con medicamentos. INSTRUCCIONES PARA EL CUIDADO EN EL HOGAR   Slo tome medicamentos de venta libre o recetados para Primary school teachercalmar el dolor, Environmental health practitionerel malestar o bajar la fiebre, segn las indicaciones de su mdico.   Siga una dieta baja en grasas hasta que su mdico lo vea nuevamente. Las grasas hacen que la vescula se Technical sales engineercontraiga, lo que puede Engineer, agriculturalproducir dolor.   Concurra a las consultas de control con su mdico segn las indicaciones. Los ataques casi siempre son recurrentes y generalmente habr que someterse a una ciruga como [place] Hermontratamiento permanente.  SOLICITE ATENCIN MDICA DE INMEDIATO SI:   El dolor aumenta y no puede controlarlo con los medicamentos.   Tiene fiebre o sntomas persistentes durante ms de 2 - 3 das.   Tiene fiebre y los sntomas empeoran repentinamente.   Tiene nuseas o vmitos persistentes.  ASEGRESE DE QUE:   Comprende estas instrucciones.  Controlar su afeccin.  Recibir ayuda de inmediato si no mejora o si empeora. Document Released: 09/24/2006 Document Revised: 08/10/2013 [facility] FoundationExitCare Patient Information 2015 [employer]Care, [place]. This information is not intended to replace advice given to you by your health care provider. Make sure you discuss any questions you have with your health care provider.

## 2014-06-18 NOTE — ED Provider Notes (Signed)
CSN: [id number]     Arrival date & time 06/18/14  1350 History   First MD Initiated Contact with Patient 06/18/14 1408     Chief Complaint  Patient presents with  . Abdominal Pain  . Emesis     (Consider location/radiation/quality/duration/timing/severity/associated sxs/prior Treatment) HPI Comments: Patient presents to the ER for evaluation of abdominal pain. Patient was sent to the ER from urgent care. Patient has been experiencing mid upper abdomen and right upper abdominal pain intermittently for some time. Episodes generally occur every couple of weeks and last for 20-30 minutes. It usually occurs when she finishes eating. She had onset of symptoms early this morning it has been continuous, which prompted her to go to urgent care. She was referred to the ER for further evaluation of this pain. The patient does have some pain radiating up into the chest, no shortness of breath. She was administered Zofran and by law to do prior to arrival, is improved.  Patient is a 33 y.o. female presenting with abdominal pain and vomiting.  Abdominal Pain Associated symptoms: chest pain and vomiting   Emesis Associated symptoms: abdominal pain     Past Medical History  Diagnosis Date  . Gestational diabetes     glyburide   Past Surgical History  Procedure Laterality Date  . No past surgeries     Family History  Problem Relation Age of Onset  . Diabetes Maternal Uncle    History  Substance Use Topics  . Smoking status: Never Smoker   . Smokeless tobacco: Never Used  . Alcohol Use: No   OB History   Grav Para Term Preterm Abortions TAB SAB Ect Mult Living   6 6 6       6      Review of Systems  Cardiovascular: Positive for chest pain.  Gastrointestinal: Positive for vomiting and abdominal pain.  All other systems reviewed and are negative.     Allergies  Gelatin; Nyquil; and Tylenol  Home Medications   Prior to Admission medications   Medication Sig Start Date End Date  Taking? Authorizing Provider  cephALEXin (KEFLEX) 500 MG capsule Take 2 capsules (1,000 mg total) by mouth 3 (three) times daily. 05/19/14   Graylon GoodZachary H Baker, PA-C  ferrous sulfate (FERROUSUL) 325 (65 FE) MG tablet Take 1 tablet (325 mg total) by mouth 2 (two) times daily. 01/30/14   Tereso NewcomerUgonna A Anyanwu, MD  ibuprofen (ADVIL,MOTRIN) 600 MG tablet Take 1 tablet (600 mg total) by mouth every 6 (six) hours. 03/22/14   Saralyn PilarAlexander Karamalegos, DO  Prenatal Vit-Fe Fumarate-FA (PRENATAL MULTIVITAMIN) TABS tablet Take 1 tablet by mouth daily at 12 noon.    Historical Provider, MD   BP 110/69  Pulse 56  Temp(Src) 97.7 F (36.5 C) (Oral)  Resp 16  Wt 157 lb (71.215 kg)  SpO2 100% Physical Exam  Constitutional: She is oriented to person, place, and time. She appears well-developed and well-nourished. No distress.  HENT:  Head: Normocephalic and atraumatic.  Right Ear: Hearing normal.  Left Ear: Hearing normal.  Nose: Nose normal.  Mouth/Throat: Oropharynx is clear and moist and mucous membranes are normal.  Eyes: Conjunctivae and EOM are normal. Pupils are equal, round, and reactive to light.  Neck: Normal range of motion. Neck supple.  Cardiovascular: Regular rhythm, S1 normal and S2 normal.  Exam reveals no gallop and no friction rub.   No murmur heard. Pulmonary/Chest: Effort normal and breath sounds normal. No respiratory distress. She exhibits no tenderness.  Abdominal: Soft.  Normal appearance and bowel sounds are normal. There is no hepatosplenomegaly. There is tenderness in the right upper quadrant and epigastric area. There is no rebound, no guarding, no tenderness at McBurney's point and negative Murphy's sign. No hernia.  Musculoskeletal: Normal range of motion.  Neurological: She is alert and oriented to person, place, and time. She has normal strength. No cranial nerve deficit or sensory deficit. Coordination normal. GCS eye subscore is 4. GCS verbal subscore is 5. GCS motor subscore is 6.  Skin:  Skin is warm, dry and intact. No rash noted. No cyanosis.  Psychiatric: She has a normal mood and affect. Her speech is normal and behavior is normal. Thought content normal.    ED Course  Procedures (including critical care time) Labs Review Labs Reviewed  CBC WITH DIFFERENTIAL  COMPREHENSIVE METABOLIC PANEL  LIPASE, BLOOD  TROPONIN I    Imaging Review No results found.   EKG Interpretation   Date/Time:  Sunday June 18 2014 13:53:45 EDT Ventricular Rate:  64 PR Interval:  145 QRS Duration: 78 QT Interval:  424 QTC Calculation: 437 R Axis:   60 Text Interpretation:  Sinus rhythm Borderline T abnormalities, diffuse  leads No significant change since last tracing Confirmed by POLLINA  MD,  CHRISTOPHER [id number]) on 06/18/2014 2:14:49 PM      MDM   Final diagnoses:  None   cholelithiasis   Patient presents to the ER for evaluation of abdominal pain. Patient has been having intermittent right upper and central abdominal discomfort after eating for some time. She had an episode of pain today without eating which was more severe than the previous ones. Tenderness was in the right upper quadrant and midepigastric, but no Murphy's sign. History seems consistent with cholelithiasis. This was confirmed by ultrasound. No evidence of acute cholecystitis. Patient will be treated with analgesia here in the ER, analgesia, Zantac at home. Followup with General surgery. Return if symptoms worsen.    Gilda Creasehristopher J. Pollina, MD 06/18/14 [phone]

## 2014-06-19 NOTE — ED Provider Notes (Signed)
Medical screening examination/treatment/procedure(s) were performed by non-physician practitioner and as supervising physician I was immediately available for consultation/collaboration.  Leslee Homeavid Caraline Deutschman, M.D.   Reuben Likesavid C Marvin Grabill, MD 06/19/14 [phone]

## 2014-08-20 ENCOUNTER — Emergency Department (HOSPITAL_COMMUNITY): Payer: Self-pay

## 2014-08-20 ENCOUNTER — Encounter (HOSPITAL_COMMUNITY): Payer: Self-pay | Admitting: Emergency Medicine

## 2014-08-20 ENCOUNTER — Emergency Department (HOSPITAL_COMMUNITY)
Admission: EM | Admit: 2014-08-20 | Discharge: 2014-08-20 | Disposition: A | Discharge status: Home / Self Care | Payer: Self-pay | Attending: Emergency Medicine | Admitting: Emergency Medicine

## 2014-08-20 DIAGNOSIS — Z3202 Encounter for pregnancy test, result negative: Secondary | ICD-10-CM | POA: Insufficient documentation

## 2014-08-20 DIAGNOSIS — R1013 Epigastric pain: Secondary | ICD-10-CM | POA: Insufficient documentation

## 2014-08-20 DIAGNOSIS — R109 Unspecified abdominal pain: Secondary | ICD-10-CM | POA: Insufficient documentation

## 2014-08-20 DIAGNOSIS — R1011 Right upper quadrant pain: Secondary | ICD-10-CM | POA: Insufficient documentation

## 2014-08-20 DIAGNOSIS — R110 Nausea: Secondary | ICD-10-CM | POA: Insufficient documentation

## 2014-08-20 DIAGNOSIS — Z8632 Personal history of gestational diabetes: Secondary | ICD-10-CM | POA: Insufficient documentation

## 2014-08-20 LAB — URINALYSIS, ROUTINE W REFLEX MICROSCOPIC
BILIRUBIN URINE: NEGATIVE
Glucose, UA: NEGATIVE mg/dL
Ketones, ur: NEGATIVE mg/dL
Nitrite: NEGATIVE
PH: 6 (ref 5.0–8.0)
Protein, ur: NEGATIVE mg/dL
Specific Gravity, Urine: 1.017 (ref 1.005–1.030)
Urobilinogen, UA: 1 mg/dL (ref 0.0–1.0)

## 2014-08-20 LAB — COMPREHENSIVE METABOLIC PANEL
ALT: 31 U/L (ref 0–35)
AST: 36 U/L (ref 0–37)
Albumin: 3.9 g/dL (ref 3.5–5.2)
Alkaline Phosphatase: 75 U/L (ref 39–117)
Anion gap: 14 (ref 5–15)
BUN: 12 mg/dL (ref 6–23)
CHLORIDE: 106 meq/L (ref 96–112)
CO2: 21 meq/L (ref 19–32)
Calcium: 9 mg/dL (ref 8.4–10.5)
Creatinine, Ser: 0.59 mg/dL (ref 0.50–1.10)
GFR calc Af Amer: >90 mL/min (ref >90)
Glucose, Bld: 115 mg/dL — ABNORMAL HIGH (ref 70–99)
Potassium: 4.6 mEq/L (ref 3.7–5.3)
SODIUM: 141 meq/L (ref 137–147)
Total Bilirubin: 0.9 mg/dL (ref 0.3–1.2)
Total Protein: 7.7 g/dL (ref 6.0–8.3)

## 2014-08-20 LAB — LIPASE, BLOOD: Lipase: 35 U/L (ref 11–59)

## 2014-08-20 LAB — CBC WITH DIFFERENTIAL/PLATELET
BASOS ABS: 0 10*3/uL (ref 0.0–0.1)
Basophils Relative: 0 % (ref 0–1)
Eosinophils Absolute: 0.3 10*3/uL (ref 0.0–0.7)
Eosinophils Relative: 4 % (ref 0–5)
HCT: 39.7 % (ref 36.0–46.0)
Hemoglobin: 13.6 g/dL (ref 12.0–15.0)
LYMPHS PCT: 50 % — AB (ref 12–46)
Lymphs Abs: 3.6 10*3/uL (ref 0.7–4.0)
MCH: 28 pg (ref 26.0–34.0)
MCHC: 34.3 g/dL (ref 30.0–36.0)
MCV: 81.9 fL (ref 78.0–100.0)
Monocytes Absolute: 0.5 10*3/uL (ref 0.1–1.0)
Monocytes Relative: 6 % (ref 3–12)
Neutro Abs: 2.9 10*3/uL (ref 1.7–7.7)
Neutrophils Relative %: 40 % — ABNORMAL LOW (ref 43–77)
PLATELETS: 220 10*3/uL (ref 150–400)
RBC: 4.85 MIL/uL (ref 3.87–5.11)
RDW: 13.8 % (ref 11.5–15.5)
WBC: 7.2 10*3/uL (ref 4.0–10.5)

## 2014-08-20 LAB — URINE MICROSCOPIC-ADD ON

## 2014-08-20 LAB — PREGNANCY, URINE: Preg Test, Ur: NEGATIVE

## 2014-08-20 MED ORDER — ONDANSETRON HCL 4 MG/2ML IJ SOLN
4.0000 mg | Freq: Once | INTRAMUSCULAR | Status: AC
  Administered 2014-08-20: 4 mg via INTRAVENOUS
  Filled 2014-08-20: qty 2

## 2014-08-20 MED ORDER — HYDROCODONE-ACETAMINOPHEN 10-325 MG PO TABS: 1.0000 | ORAL_TABLET | Freq: Four times a day (QID) | ORAL | Status: DC | PRN

## 2014-08-20 MED ORDER — ONDANSETRON 4 MG PO TBDP: 4.0000 mg | ORAL_TABLET | Freq: Three times a day (TID) | ORAL | Status: DC | PRN

## 2014-08-20 MED ORDER — HYDROMORPHONE HCL PF 1 MG/ML IJ SOLN
1.0000 mg | Freq: Once | INTRAMUSCULAR | Status: AC
  Administered 2014-08-20: 1 mg via INTRAVENOUS
  Filled 2014-08-20: qty 1

## 2014-08-20 MED ORDER — SODIUM CHLORIDE 0.9 % IV SOLN
1000.0000 mL | Freq: Once | INTRAVENOUS | Status: AC
  Administered 2014-08-20: 1000 mL via INTRAVENOUS

## 2014-08-20 MED ORDER — FENTANYL CITRATE 0.05 MG/ML IJ SOLN
50.0000 ug | Freq: Once | INTRAMUSCULAR | Status: AC
  Administered 2014-08-20: 50 ug via INTRAVENOUS
  Filled 2014-08-20: qty 2

## 2014-08-20 NOTE — ED Notes (Signed)
Patient returned from Ultrasound. 

## 2014-08-20 NOTE — ED Provider Notes (Signed)
CSN: [id number]     Arrival date & time 08/20/14  0037 History   First MD Initiated Contact with Patient 08/20/14 [phone]     Chief Complaint  Patient presents with  . Abdominal Pain     (Consider location/radiation/quality/duration/timing/severity/associated sxs/prior Treatment) HPI Patient presents with concern of epigastric and right upper quadrant pain. There is associated nausea. Symptoms began approximately 3 hours ago. Since onset no clear alleviating or exacerbating factors. There is no associated fevers, chills, vomiting, diarrhea. Patient has previously been told she has biliary disease, has not followed up with Gen. surgery or primary care.  Past Medical History  Diagnosis Date  . Gestational diabetes     glyburide   Past Surgical History  Procedure Laterality Date  . No past surgeries     Family History  Problem Relation Age of Onset  . Diabetes Maternal Uncle    History  Substance Use Topics  . Smoking status: Never Smoker   . Smokeless tobacco: Never Used  . Alcohol Use: No   OB History   Grav Para Term Preterm Abortions TAB SAB Ect Mult Living   Review of Systems  Constitutional:       Per HPI, otherwise negative  HENT:       Per HPI, otherwise negative  Respiratory:       Per HPI, otherwise negative  Cardiovascular:       Per HPI, otherwise negative  Gastrointestinal: Positive for nausea and abdominal pain. Negative for vomiting and diarrhea.  Endocrine:       Negative aside from HPI  Genitourinary:       Neg aside from HPI   Musculoskeletal:       Per HPI, otherwise negative  Skin: Negative.   Neurological: Negative for syncope.      Allergies  Gelatin; Nyquil; and Tylenol  Home Medications   Prior to Admission medications   Medication Sig Start Date End Date Taking? Authorizing Provider  ondansetron (ZOFRAN) 4 MG tablet Take 1 tablet (4 mg total) by mouth every 6 (six) hours. 06/18/14  Yes Shon Baton, MD   oxyCODONE-acetaminophen (PERCOCET/ROXICET) 5-325 MG per tablet Take 1 tablet by mouth every 6 (six) hours as needed for moderate pain or severe pain. 06/18/14  Yes Shon Baton, MD   BP 108/69  Pulse 74  Temp(Src) 98.2 F (36.8 C) (Oral)  Resp 20  Ht  (1.676 m)  Wt 156 lb (70.761 kg)  BMI 25.19 kg/m2  SpO2 94% Physical Exam  Nursing note and vitals reviewed. Constitutional: She is oriented to person, place, and time. She appears well-developed and well-nourished. No distress.  HENT:  Head: Normocephalic and atraumatic.  Eyes: Conjunctivae and EOM are normal.  Cardiovascular: Normal rate and regular rhythm.   Pulmonary/Chest: Effort normal and breath sounds normal. No stridor. No respiratory distress.  Abdominal: She exhibits no distension. There is tenderness in the right upper quadrant and epigastric area.  Musculoskeletal: She exhibits no edema.  Neurological: She is alert and oriented to person, place, and time. No cranial nerve deficit.  Skin: Skin is warm and dry.  Psychiatric: She has a normal mood and affect.    ED Course  Procedures (including critical care time) Labs Review Labs Reviewed  CBC WITH DIFFERENTIAL - Abnormal; Notable for the following:    Neutrophils Relative % 40 (*)    Lymphocytes Relative 50 (*)    All  other components within normal limits  COMPREHENSIVE METABOLIC PANEL - Abnormal; Notable for the following:    Glucose, Bld 115 (*)    All other components within normal limits  URINALYSIS, ROUTINE W REFLEX MICROSCOPIC - Abnormal; Notable for the following:    APPearance CLOUDY (*)    Hgb urine dipstick SMALL (*)    Leukocytes, UA MODERATE (*)    All other components within normal limits  URINE MICROSCOPIC-ADD ON - Abnormal; Notable for the following:    Squamous Epithelial / LPF MANY (*)    Bacteria, UA MANY (*)    All other components within normal limits  LIPASE, BLOOD  PREGNANCY, URINE    Imaging Review US Abdomen  Limited  08/20/2014   CLINICAL DATA:  Right upper quadrant pain, nausea and vomiting.  EXAM: US ABDOMEN LIMITED - RIGHT UPPER QUADRANT  COMPARISON:  06/18/2014  FINDINGS: Gallbladder:  Large stone in the gallbladder neck measuring up to 2.2 cm diameter. Nonmobile. No gallbladder wall thickening or edema. Murphy's sign is positive. Findings are nonspecific but compatible with acute cholecystitis in the appropriate clinical setting.  Common bile duct:  Diameter: 5.8 mm, normal  Liver:  Circumscribed hyperechoic lesion in the left lobe of the liver measuring about 1.3 cm maximal diameter. This is stable since previous study. Most likely this represents a hemangioma.  IMPRESSION: Cholelithiasis with positive Murphy's sign. Changes are compatible with acute cholecystitis in the appropriate clinical setting. Circumscribed hyperechoic liver lesion in the left lobe likely representing hemangioma.   Electronically Signed   By: Burman Nieves M.D.   On: 08/20/2014 04:11   On repeat exam the patient has no abdominal pain, no active complaints. We reviewed all ultrasound findings, labs. Patient will follow up with Gen. surgery in 2 days. MDM   Patient presents with upper abdominal pain.  Patient has no initial Murphy sign, but does have epigastric pain, and notes a history of gallbladder disease. Patient's labs are reassuring, and although the ultrasound suggests possible acute cholecystitis, given the absence of fever, leukocytosis or abnormal LFT, this seems unlikely currently. However, with the presence of cholelithiasis, patient was advised of the necessity for outpatient evaluation, soon for elective removal of her gallbladder.    Gerhard Munch, MD 08/20/14 [phone]

## 2014-08-20 NOTE — Discharge Instructions (Signed)
Es muy importante que llamas nuestra especialistas en Lunes.  Regresa aqui si tiene problemas nuevas.

## 2014-08-20 NOTE — ED Notes (Signed)
Pt called out stating that her pain is getting worse.

## 2014-08-20 NOTE — ED Notes (Addendum)
Pt states that she started having pain in the epigastric region, radiating around to her back, x 2 hours. Pt states that she has been seen here for the same and she was prescribed zofran and percocet. Pt states that the percocet has not helped her pain. Pt reports nausea, which has been helped by the zofran.

## 2014-10-20 IMAGING — US US OB FOLLOW-UP
1 series · 12 of 28 positions shown · non-contrast
Comparison: none

[Series 1: us ob follow-up · 48 acquisitions, 12 frames shown]
[im 2/48]
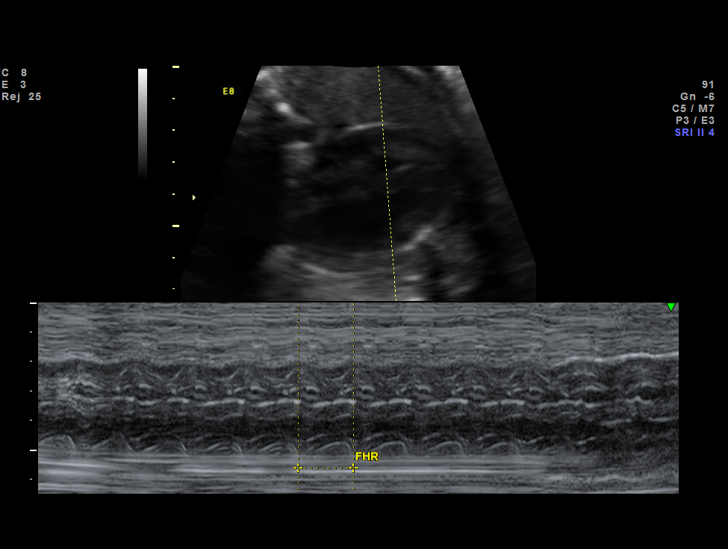
[im 6/48]
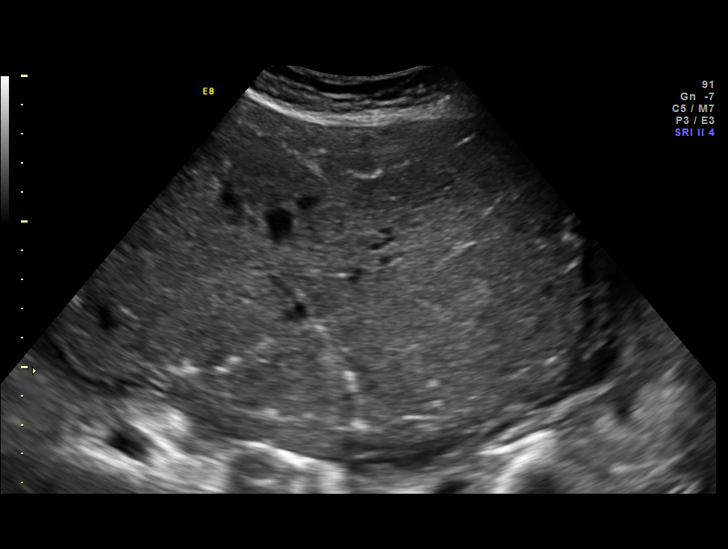
[im 9/48]
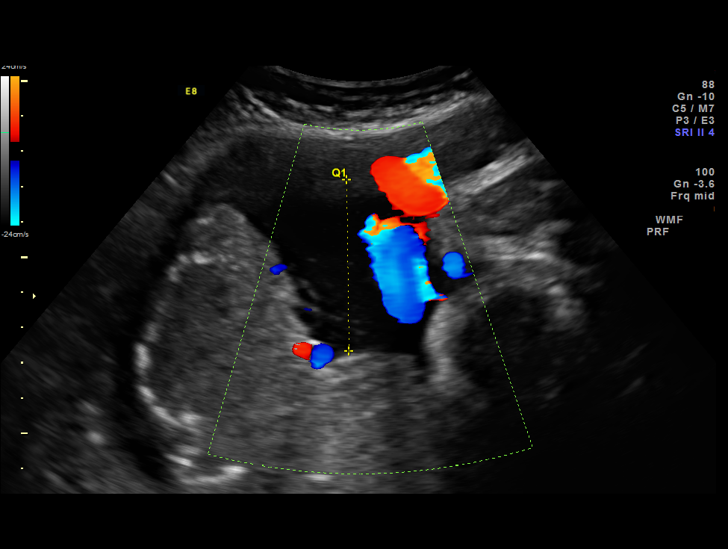
[im 14/48]
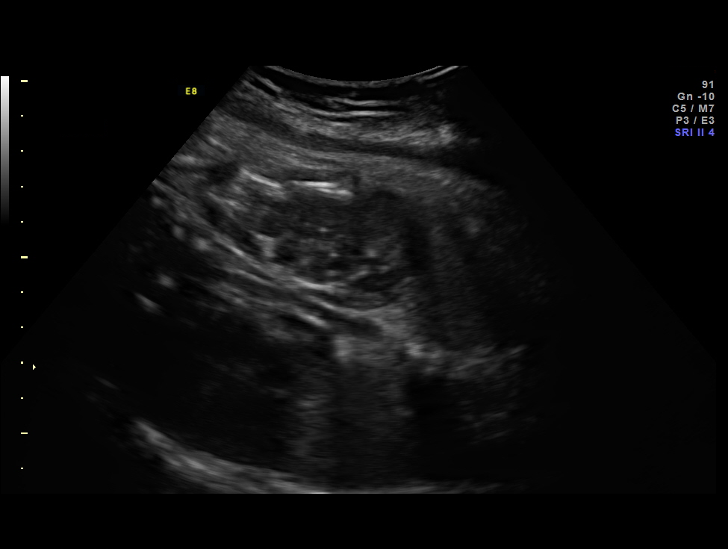
[im 18/48]
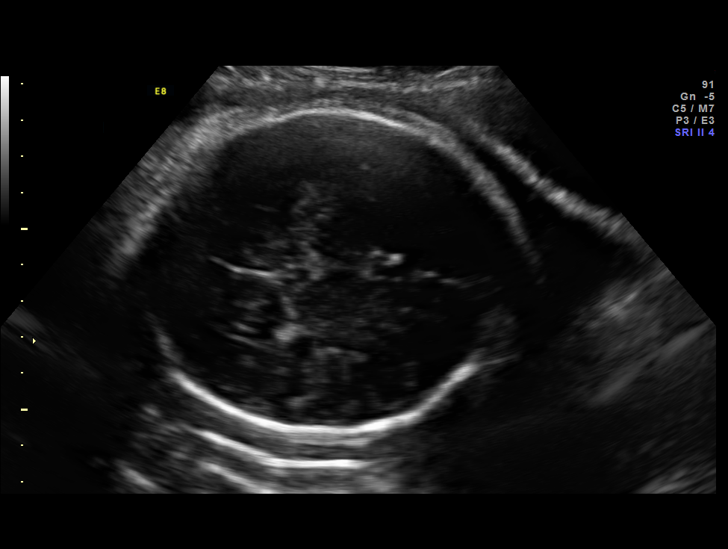
[im 21/48]
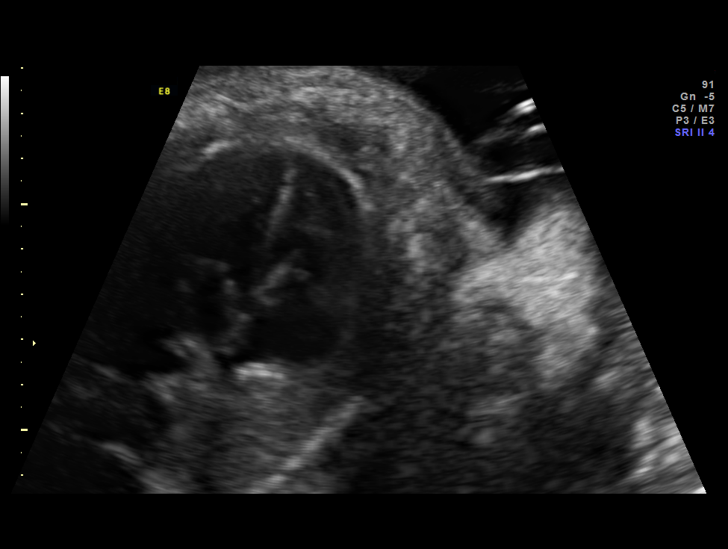
[im 27/48]
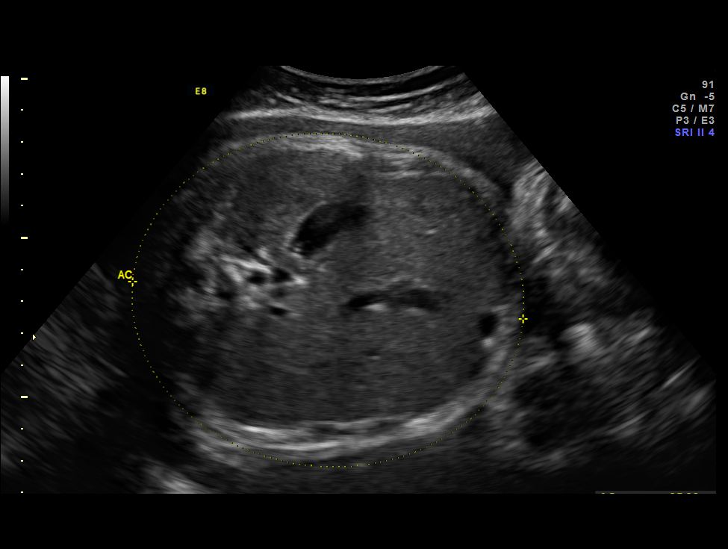
[im 30/48]
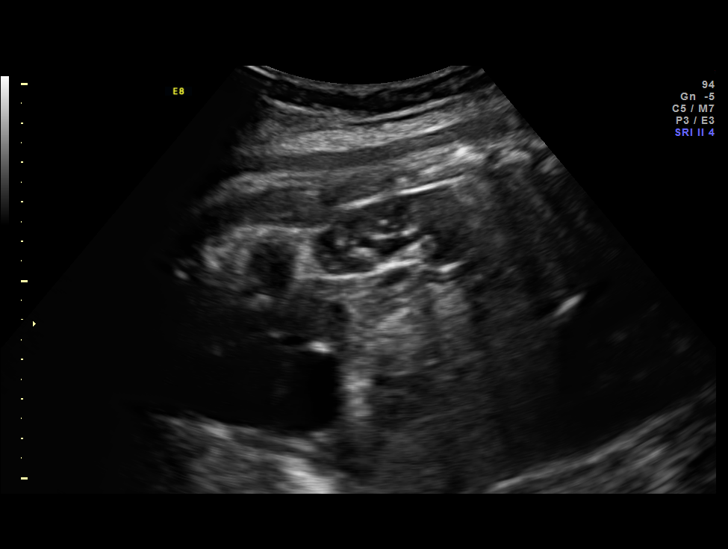
[im 34/48]
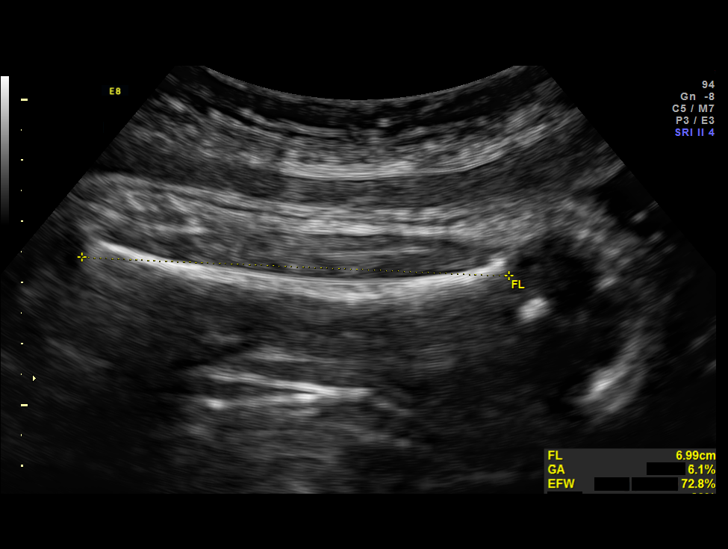
[im 39/48]
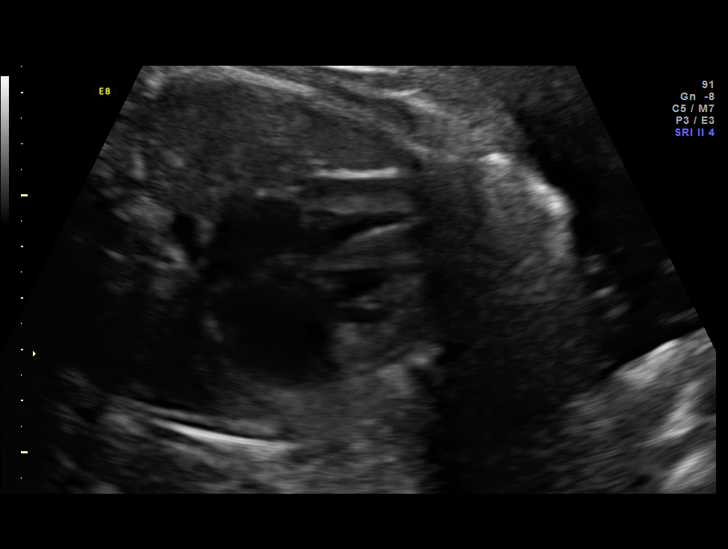
[im 42/48]
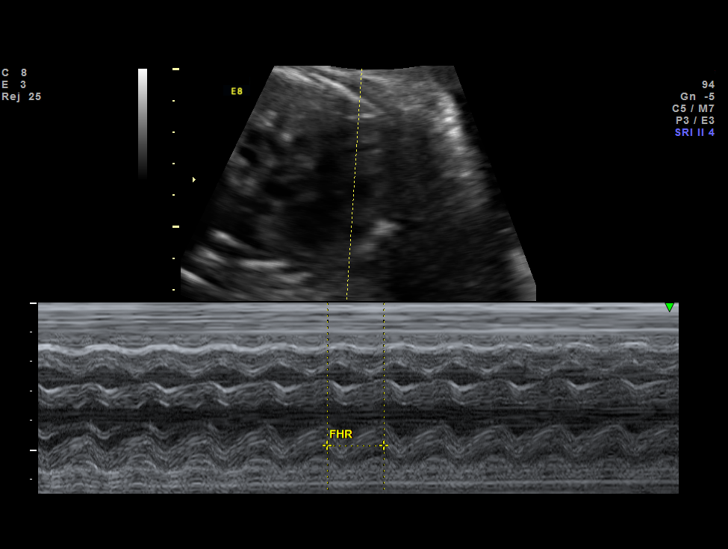
[im 46/48]
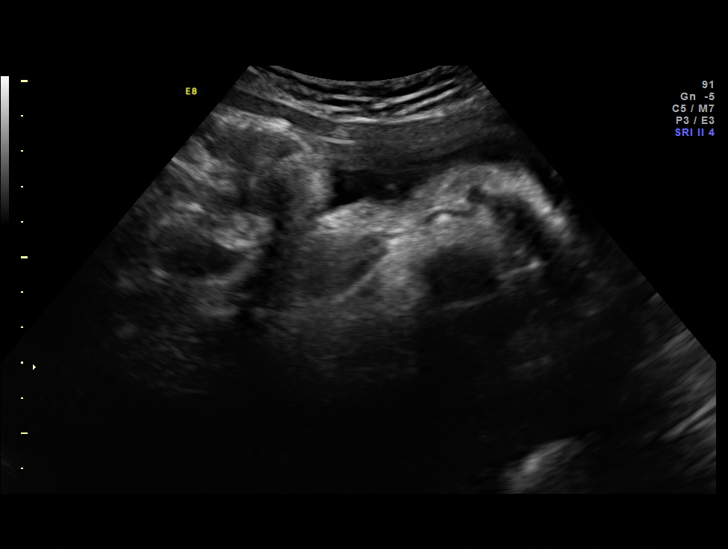

[12 of 28 positions shown; findings below may reference images not displayed]

OBSTETRICS REPORT
                      (Signed Final 03/16/2014 [DATE])

Service(s) Provided

 US OB FOLLOW UP                                       76816.1
Indications

 Diabetes - Gestational, A2 (medication controlled)
 Poor obstetric history: Previous gestational
 diabetes
 Size greater than dates (Large for gestational [AGE]
Fetal Evaluation

 Num Of Fetuses:    1
 Fetal Heart Rate:  169                          bpm
 Cardiac Activity:  Observed
 Presentation:      Cephalic
 Placenta:          Posterior, above cervical
                    os

 Amniotic Fluid
 AFI FV:      Subjectively within normal limits
 AFI Sum:     15.69   cm       61  %Tile     Larg Pckt:    4.86  cm
 RUQ:   4.86    cm   RLQ:    3.38   cm    LUQ:   2.92    cm   LLQ:    4.53   cm
Biophysical Evaluation

 Amniotic F.V:   Pocket => 2 cm two         F. Tone:        Observed
                 planes
 F. Movement:    Observed                   Score:          [DATE]
 F. Breathing:   Observed
Biometry

 BPD:     87.4  mm     G. Age:  35w 2d                CI:         75.7   70 - 86
 OFD:    115.4  mm                                    FL/HC:      21.4   20.9 -

 HC:     324.7  mm     G. Age:  36w 5d        8  %    HC/AC:      0.91   0.92 -

 AC:       357  mm     G. Age:  39w 4d       92  %    FL/BPD:     79.5   71 - 87
 FL:      69.5  mm     G. Age:  35w 5d        5  %    FL/AC:      19.5   20 - 24
 HUM:     61.1  mm     G. Age:  35w 3d       22  %

 Est. FW:    1117  gm      7 lb 6 oz     72  %
Gestational Age

 LMP:           38w 2d        Date:  06/21/13                 EDD:   03/28/14
 U/S Today:     36w 6d                                        EDD:   04/07/14
 Best:          38w 2d     Det. By:  LMP  (06/21/13)          EDD:   03/28/14
Anatomy

 Cranium:          Appears normal         Aortic Arch:      Previously seen
 Fetal Cavum:      Appears normal         Ductal Arch:      Not well visualized
 Ventricles:       Appears normal         Diaphragm:        Previously seen
 Choroid Plexus:   Previously seen        Stomach:          Appears normal
 Cerebellum:       Previously seen        Abdomen:          Appears normal
 Posterior Fossa:  Previously seen        Abdominal Wall:   Previously seen
 Nuchal Fold:      Not applicable (>20    Cord Vessels:     Previously seen
                   wks GA)
 Face:             Orbits and profile     Kidneys:          Appear normal
                   previously seen
 Lips:             Previously seen        Bladder:          Appears normal
 Heart:            Appears normal         Spine:            Previously seen
                   (4CH, axis, and
                   situs)
 RVOT:             Previously seen        Lower             Previously seen
                                          Extremities:
 LVOT:             Previously seen        Upper             Previously seen
                                          Extremities:

 Other:  Female gender. Heels previously visualized. Technically difficult due
         to maternal habitus and fetal position.
Cervix Uterus Adnexa

 Cervix:       Not visualized (advanced GA >37wks)

 Adnexa:     No abnormality visualized.
Impression

 SIUP at 38+2 weeks
 Normal interval anatomy
 Normal amniotic fluid volume
 Appropriate interval growth with EFW at the 72nd %tile (1117
 grams)
 BPP [DATE]
Recommendations

 Follow-up as clinically indicated

## 2014-10-23 ENCOUNTER — Encounter (HOSPITAL_COMMUNITY): Payer: Self-pay | Admitting: Emergency Medicine

## 2015-03-26 IMAGING — US US ABDOMEN LIMITED
1 series · 14 of 25 positions shown · non-contrast
Comparison: 06/18/2014

CLINICAL DATA: Right upper quadrant pain, nausea and vomiting.

EXAM:
US ABDOMEN LIMITED - RIGHT UPPER QUADRANT

[Series 1: us abdomen limited · 0.28mm/px · 14 of 48 slices shown]
[im 1/48]
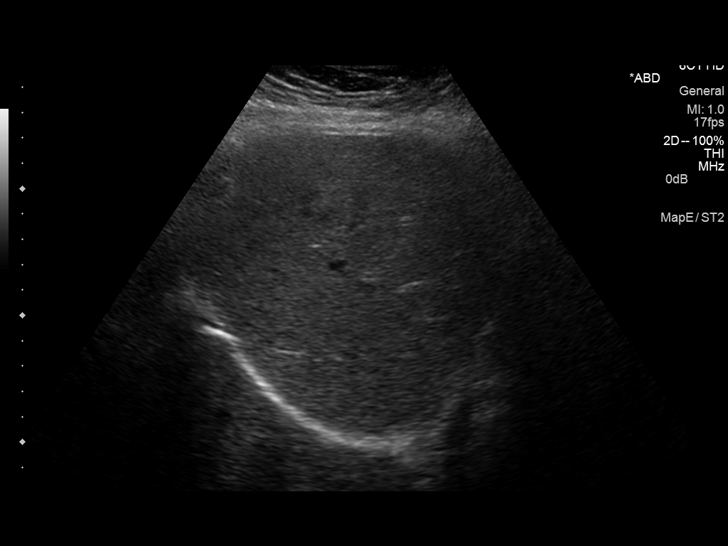
[im 4/48]
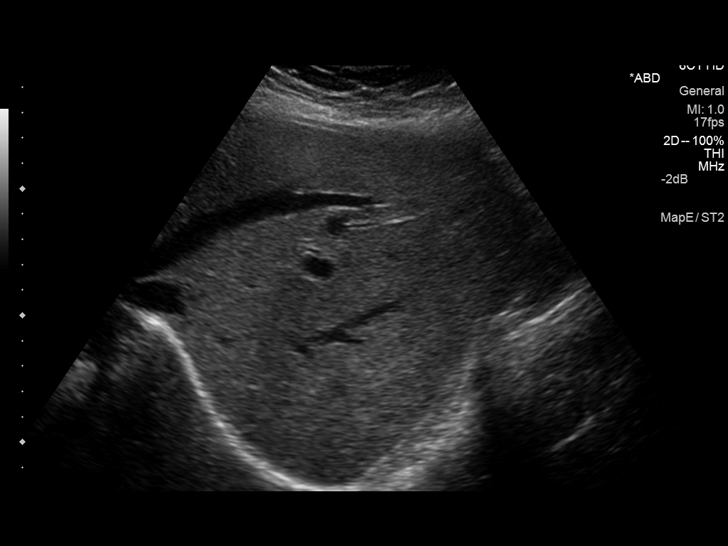
[im 8/48]
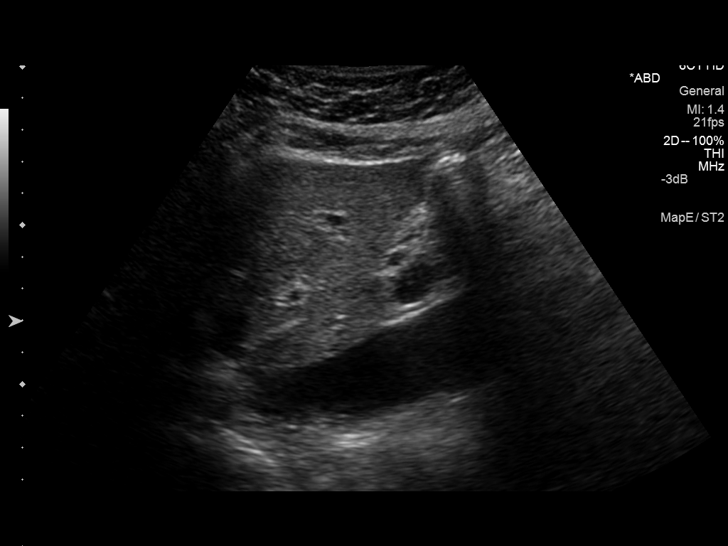
[im 12/48]
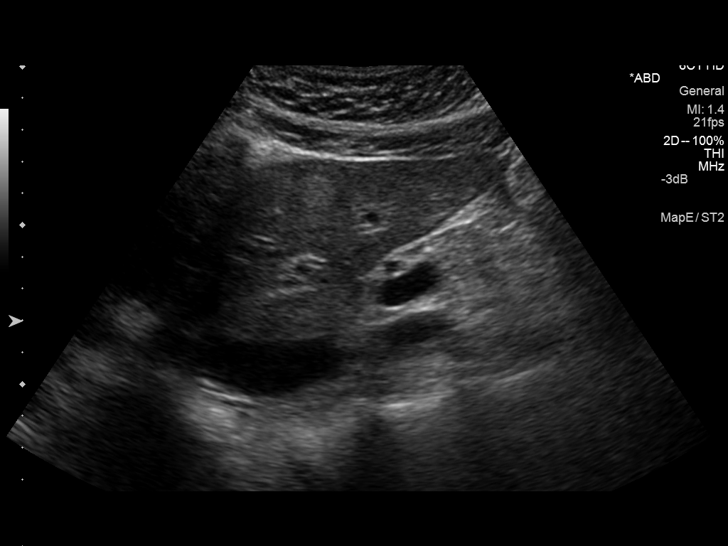
[im 16/48]
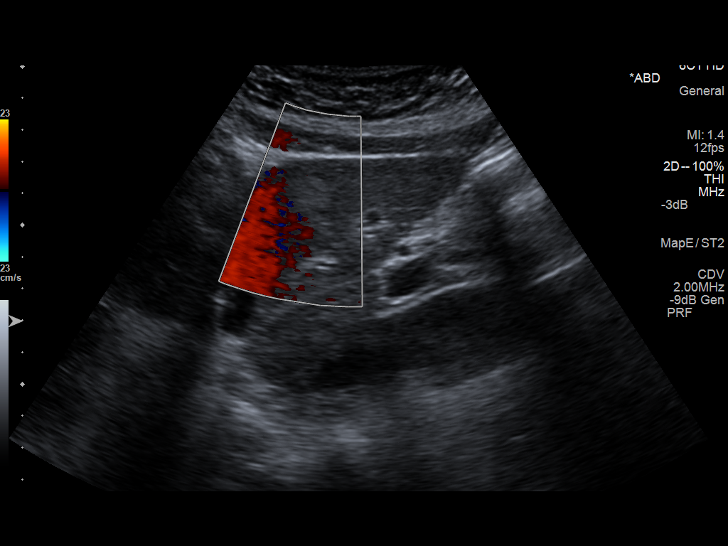
[im 18/48]
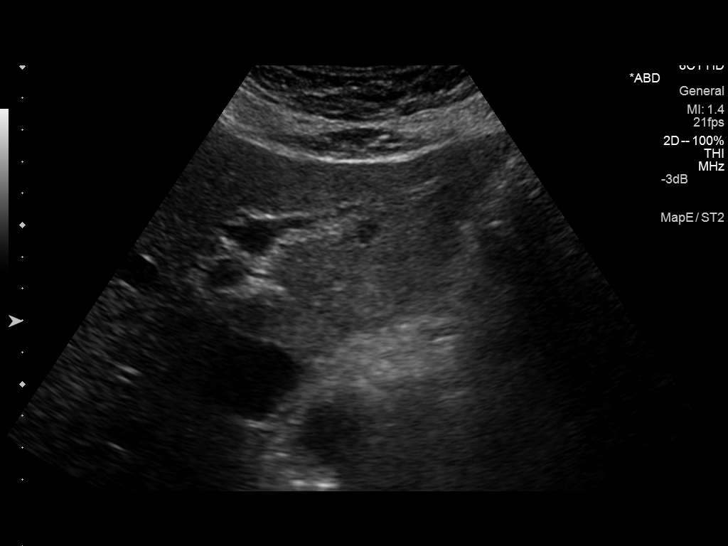
[im 22/48]
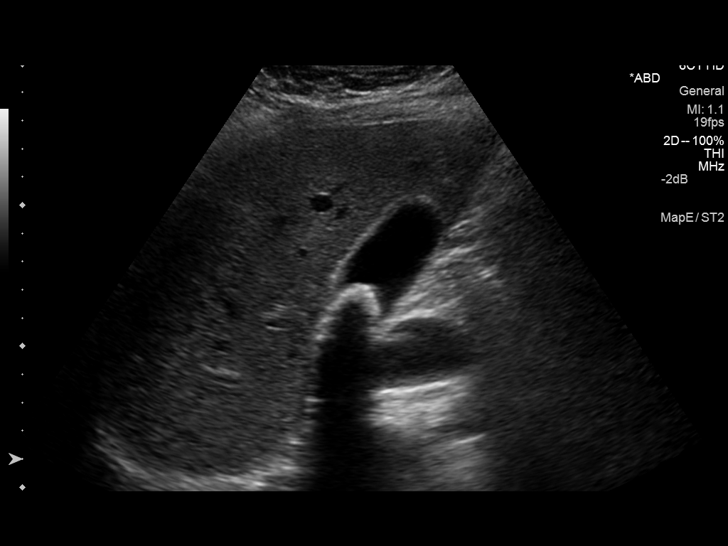
[im 26/48]
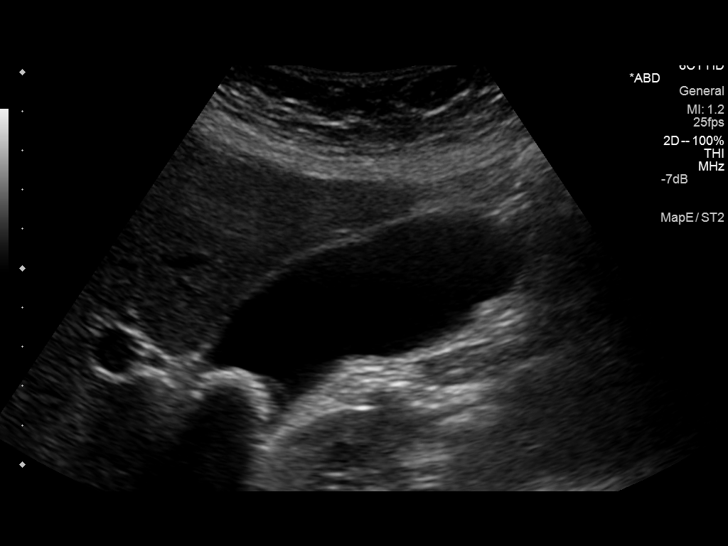
[im 30/48]
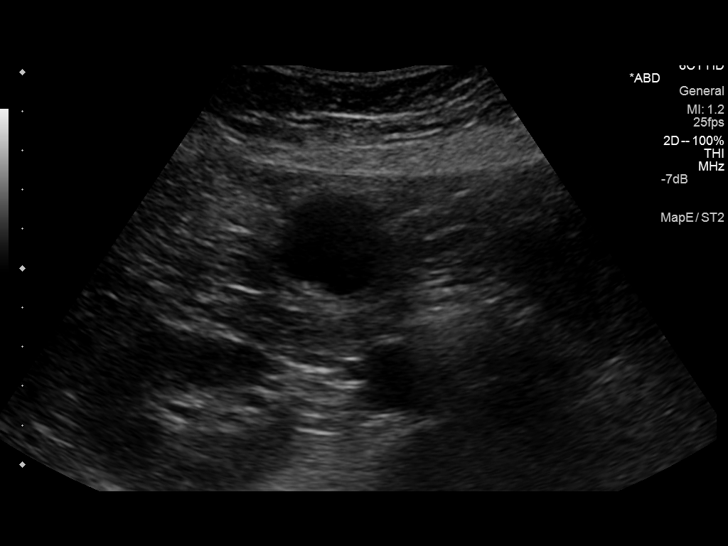
[im 32/48]
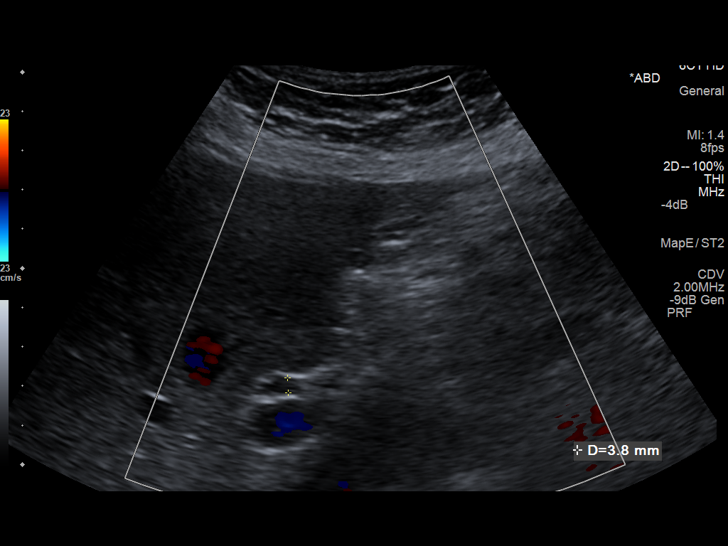
[im 36/48]
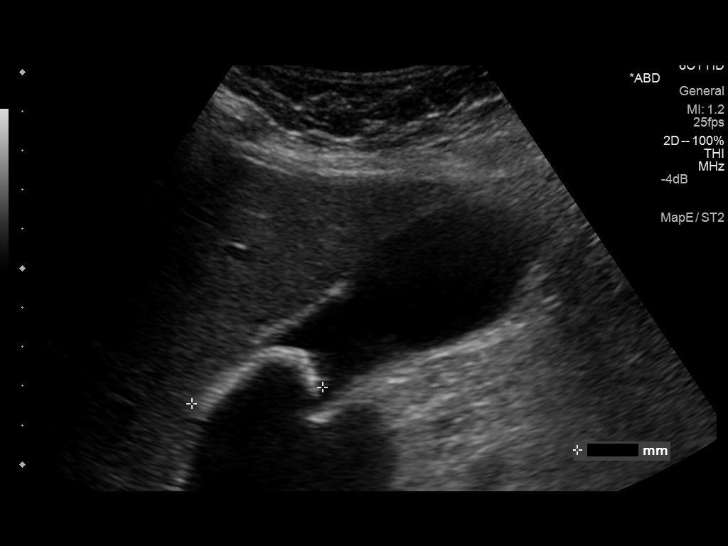
[im 40/48]
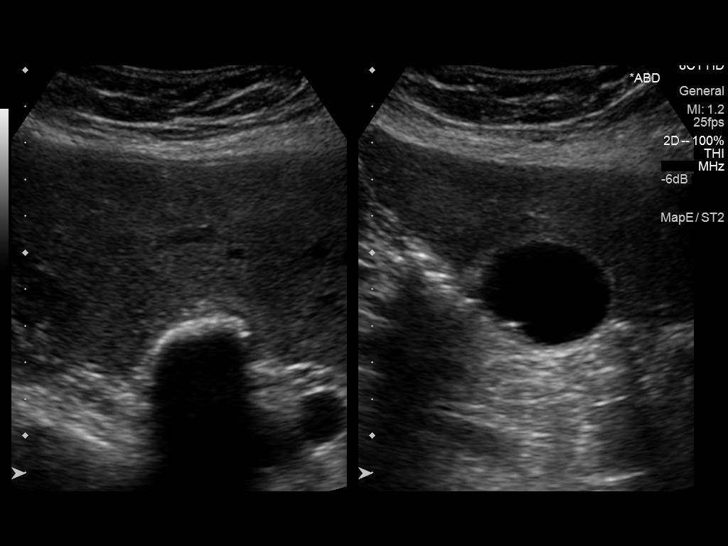
[im 44/48]
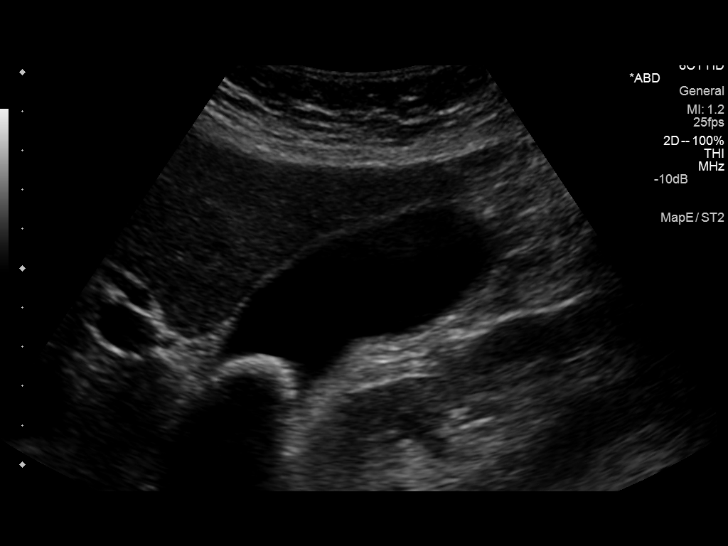
[im 48/48]
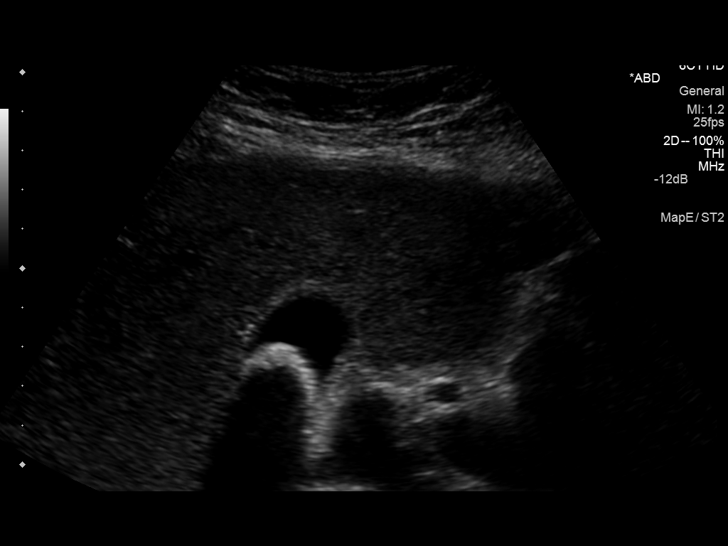

[14 of 25 positions shown; findings below may reference images not displayed]

FINDINGS: Gallbladder:

Large stone in the gallbladder neck measuring up to 2.2 cm diameter.
Nonmobile. No gallbladder wall thickening or edema. Murphy's sign is
positive. Findings are nonspecific but compatible with acute
cholecystitis in the appropriate clinical setting.

Common bile duct:

Diameter: 5.8 mm, normal

Liver:

Circumscribed hyperechoic lesion in the left lobe of the liver
measuring about 1.3 cm maximal diameter. This is stable since
previous study. Most likely this represents a hemangioma.
IMPRESSION: Cholelithiasis with positive Murphy's sign. Changes are compatible
with acute cholecystitis in the appropriate clinical setting.
Circumscribed hyperechoic liver lesion in the left lobe likely
representing hemangioma.

## 2015-08-28 ENCOUNTER — Other Ambulatory Visit (HOSPITAL_COMMUNITY): Payer: Self-pay | Admitting: *Deleted

## 2015-08-28 DIAGNOSIS — N644 Mastodynia: Secondary | ICD-10-CM

## 2015-08-28 DIAGNOSIS — N6452 Nipple discharge: Secondary | ICD-10-CM

## 2015-09-03 ENCOUNTER — Ambulatory Visit (HOSPITAL_COMMUNITY)
Admission: RE | Admit: 2015-09-03 | Discharge: 2015-09-03 | Disposition: A | Discharge status: Home / Self Care | Payer: Self-pay | Source: Ambulatory Visit | Attending: Obstetrics and Gynecology | Admitting: Obstetrics and Gynecology

## 2015-09-03 ENCOUNTER — Encounter (HOSPITAL_COMMUNITY): Payer: Self-pay

## 2015-09-03 VITALS — BP 100/62 | Temp 97.8°F | Ht 62.0 in | Wt 165.0 lb

## 2015-09-03 DIAGNOSIS — N644 Mastodynia: Secondary | ICD-10-CM

## 2015-09-03 NOTE — Progress Notes (Signed)
CLINIC:   [facility] ([facility]) Clinic  REASON FOR VISIT: Well-woman exam with routine gynecological exam  HISTORY OF PRESENT ILLNESS:   Kimberly Avila is a 34 y.o. female who presents to the [facility] today for clinical breast exam. Interpreter, Delorise Royals, was present. No previous history of a mammigram.  Her last pap smear was performed in June 2016 and was negative. Had an abnormal pap in October 2014 which showed ASC-US; +HPV.   REVIEW OF SYSTEMS:   Reports left breast pain for the past two month. Pain goes to left arm and worse with movement. Reported left breast discharge that was brown in color on two occasions. Denies chest pain and shortness of breath. Denies nodularity, skin changes, or nipple inversion, bilaterally.  Denies any pelvic pain, pressure, or abnormal vaginal bleeding.   ALLERGIES: Allergies  Allergen Reactions  . Gelatin Itching  . Nyquil [Pseudoeph-Doxylamine-Dm-Apap] Itching  . Tylenol [Acetaminophen] Itching    MEDICATIONS:  Current outpatient prescriptions:  .  HYDROcodone-acetaminophen (NORCO) 10-325 MG per tablet, Take 1 tablet by mouth every 6 (six) hours as needed. (Patient not taking: Reported on 09/03/2015), Disp: 15 tablet, Rfl: 0 .  ondansetron (ZOFRAN ODT) 4 MG disintegrating tablet, Take 1 tablet (4 mg total) by mouth every 8 (eight) hours as needed for nausea or vomiting. (Patient not taking: Reported on 09/03/2015), Disp: 20 tablet, Rfl: 0 .  oxyCODONE-acetaminophen (PERCOCET/ROXICET) 5-325 MG per tablet, Take 1 tablet by mouth every 6 (six) hours as needed for moderate pain or severe pain. (Patient not taking: Reported on 09/03/2015), Disp: 10 tablet, Rfl: 0  PHYSICAL EXAM:   BP 100/62 mmHg  Temp(Src) 97.8 F (36.6 C)  Ht  (1.575 m)  Wt 165 lb (74.844 kg)  BMI 30.17 kg/m2  General: Well-nourished, well-appearing female in no acute distress.  She is accompanied by an interpreter in clinic today.  Stoney Bang, LPN was present during physical exam for this patient.   Breasts: Bilateral breasts exposed and observed with patient standing (arms at side, arms on hips, arms on hips flexed forward, and arms over head).  No gross abnormalities including breast skin puckering or dimpling noted on observation.  Breasts symmetrical without evidence of skin redness, thickening, or peau d'orange appearance. No nipple retraction or nipple discharge noted bilaterally.  No breast nodularity palpated in bilateral breasts. Left breast tender with palpation. Axillary lymph nodes: No axillary lymphadenopathy bilaterally.     ASSESSMENT & PLAN:  1. Breast cancer screening: Ms. Rotert has no palpable breast abnormalities on her clinical breast exam today. She will receive her diagnostic mammogram as scheduled.  She will be contacted by the imaging center for results of the mammogram. She was given instructions and educational materials regarding breast self-awareness. Ms. Rosado is aware of this plan and agrees with it.   2. Cervical cancer screening: Ms. O[place]ltree had a recent normal pap. Repeat pap is not indicated at this time.     Ms. Horgan was encouraged to ask questions and all questions were answered to her satisfaction.      Clenton Pare, DNP, AGPCNP-BC, [facility] [phone]

## 2015-09-05 ENCOUNTER — Ambulatory Visit
Admission: RE | Admit: 2015-09-05 | Discharge: 2015-09-05 | Disposition: A | Discharge status: Home / Self Care | Payer: No Typology Code available for payment source | Source: Ambulatory Visit | Attending: Obstetrics and Gynecology | Admitting: Obstetrics and Gynecology

## 2015-09-05 ENCOUNTER — Other Ambulatory Visit (HOSPITAL_COMMUNITY): Payer: Self-pay | Admitting: Obstetrics and Gynecology

## 2015-09-05 DIAGNOSIS — N6452 Nipple discharge: Secondary | ICD-10-CM

## 2015-09-05 DIAGNOSIS — N644 Mastodynia: Secondary | ICD-10-CM

## 2015-11-29 ENCOUNTER — Ambulatory Visit: Payer: Self-pay | Admitting: Internal Medicine

## 2016-05-07 NOTE — Addendum Note (Signed)
Encounter addended by: Priscille Heidelberghristine P Gloria Lambertson, RN on: 05/07/2016 10:07 AM<BR>     Documentation filed: Charges VN

## 2016-10-16 ENCOUNTER — Encounter: Payer: Self-pay | Admitting: Internal Medicine

## 2016-10-16 ENCOUNTER — Ambulatory Visit (INDEPENDENT_AMBULATORY_CARE_PROVIDER_SITE_OTHER): Payer: Self-pay | Admitting: Internal Medicine

## 2016-10-16 VITALS — BP 120/76 | HR 76 | Resp 19 | Ht 62.0 in | Wt 167.0 lb

## 2016-10-16 DIAGNOSIS — N898 Other specified noninflammatory disorders of vagina: Secondary | ICD-10-CM

## 2016-10-16 DIAGNOSIS — A630 Anogenital (venereal) warts: Secondary | ICD-10-CM

## 2016-10-16 DIAGNOSIS — N739 Female pelvic inflammatory disease, unspecified: Secondary | ICD-10-CM

## 2016-10-16 LAB — POCT WET PREP WITH KOH
KOH PREP POC: NEGATIVE
Trichomonas, UA: NEGATIVE
Yeast Wet Prep HPF POC: NEGATIVE

## 2016-10-16 NOTE — Progress Notes (Signed)
   Subjective:    Patient ID: Kimberly Avila, female    DOB: 06-24-1981, 35 y.o.   MRN: [id number]  HPI   Here for acute visit  Noted little tender bumps in vaginal area 6 days ago.  Describes the perineal area.   Had noted discomfort there with walking that day.   Does have mild white discharge.  No odor and no itching.   Maybe a little pain bilaterally in pelvic area  R>>L. No fevers. Has never had these bumps before.   No new exposures to the perineal area. Married.  Husband without any symptoms. She believes they are both monogamous. 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 etonogestrel (NEXPLANON) 68 MG IMPL implant 1 each by Subdermal route once. 3 year removal date is 05/26/2017   Allergies  Allergen Reactions  . Gelatin Itching  . Ibuprofen Rash  . Nyquil [Pseudoeph-Doxylamine-Dm-Apap] Itching  . Tylenol [Acetaminophen] Itching   Review of Systems     Objective:   Physical Exam   Abd:  S, NT, No HSM or mass, + BS GU:  No uterine or adnexal mass or tenderness.  Cervix a bit friable at os.  Scant yellow-white discharge.  No odor.  No CMT. Fimbriated warts at posterior aspect of vaginal introitus bilaterally, total of 3 warts with significant surrounding wet inflammation and mucosal irritation.  Very tender when manipulate this area.       Assessment & Plan:  1.  Genital warts:  Checking Gonorrhea and chlamydia as well as swab from inflamed area of perineum/introitus for Herpes Culture.   Will have her return to discuss in 1 week. 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 Discussed to avoid intercourse for now and that she really does not have enough information to adequately discuss with  her husband. Consider testing also for HIV and RPR when she returns if continues with significant inflammation and herpes culture negative.

## 2016-10-19 LAB — HERPES SIMPLEX VIRUS CULTURE

## 2016-10-20 LAB — GC/CHLAMYDIA PROBE AMP
Chlamydia trachomatis, NAA: NEGATIVE
Neisseria gonorrhoeae by PCR: NEGATIVE

## 2016-11-06 ENCOUNTER — Ambulatory Visit (INDEPENDENT_AMBULATORY_CARE_PROVIDER_SITE_OTHER): Payer: Self-pay | Admitting: Internal Medicine

## 2016-11-06 ENCOUNTER — Encounter: Payer: Self-pay | Admitting: Internal Medicine

## 2016-11-06 VITALS — BP 120/82 | HR 78 | Resp 12 | Ht 60.5 in | Wt 166.0 lb

## 2016-11-06 DIAGNOSIS — N761 Subacute and chronic vaginitis: Secondary | ICD-10-CM

## 2016-11-06 DIAGNOSIS — Z630 Problems in relationship with spouse or partner: Secondary | ICD-10-CM

## 2016-11-06 LAB — POCT WET PREP WITH KOH
CLUE CELLS WET PREP PER HPF POC: NEGATIVE
KOH Prep POC: NEGATIVE
RBC WET PREP PER HPF POC: NEGATIVE
Trichomonas, UA: NEGATIVE
Yeast Wet Prep HPF POC: NEGATIVE

## 2016-11-06 MED ORDER — FLUCONAZOLE 150 MG PO TABS: 150.0000 mg | ORAL_TABLET | Freq: Every day | ORAL | 0 refills | Status: DC

## 2016-11-06 NOTE — Patient Instructions (Addendum)
habla clinica lunes con progreso  Monistat 7 day

## 2016-11-06 NOTE — Progress Notes (Signed)
   Subjective:    Patient ID: Kimberly Avila, female    DOB: [birth year], 35 y.o.   MRN: [id number]  HPI   Here for treatment of genital warts.  Has questions. States is getting more warts in genital area now. Shared in 2012 she was treated for gonorrhea with PHD while with her husband. Discussion with husband, who is present about putting his wife at risk with STDs and causing her health risks.  Meds:  Nexplanon  Allergies  Allergen Reactions  . Gelatin Itching  . Ibuprofen Rash  . Nyquil [Pseudoeph-Doxylamine-Dm-Apap] Itching  . Tylenol [Acetaminophen] Itching       Review of Systems     Objective:   Physical Exam  GU:   Inflamed vaginal mucosa with thin white dc--no real cervical inflammation.  No CMT.  No uterine or adnexal mass or tenderness.  Warts now appear dry, no longer fimbriated and at posterior aspect of vaginal introitus--difficult to see today    Assessment & Plan:  1.  Vaginal inflammation:  Too inflammed at introitus to treat residual genital warts.   Wet prep with lots of WBCs, but otherwise not diagnostic of cause. Treat with Fluconazole for 3 days and patient to return for reevaluation as well as treatment of warts if still apparent.  To call progress on Monday.  2.  Marital conflict/concerns:  Husband today states he agrees to marriage counseling.  Referral to Samul DadaN. Knight, LCSW

## 2016-11-17 ENCOUNTER — Other Ambulatory Visit: Payer: Self-pay | Admitting: Licensed Clinical Social Worker

## 2016-11-20 ENCOUNTER — Telehealth: Payer: Self-pay | Admitting: Internal Medicine

## 2016-11-20 NOTE — Telephone Encounter (Signed)
Patient feeling better wanted Dr. Delrae AlfredMulberry to know that she has completed her Rx's and her vaginal concerns are better.

## 2016-11-21 NOTE — Telephone Encounter (Signed)
Patient called again today and states warts look  better and is not having vaginal pain . Patient states she had intercourse one time and wants to know if is ok to continue with her intimate activity or if she needs to wait until warts are treated and when she needs to come in for treatment

## 2016-11-21 NOTE — Telephone Encounter (Signed)
To Dr. Delrae AlfredMulberry for determination of next treatment.

## 2016-11-25 NOTE — Telephone Encounter (Signed)
Have her come in for reevaluation and see if warts need treatment

## 2016-11-25 NOTE — Telephone Encounter (Signed)
To Estefania to call patient and schedule appointment

## 2016-12-04 NOTE — Telephone Encounter (Signed)
Patient contacted to see if she could come in 12/04/16 @ 3pm for treatment. States her car broke down and she will call to scheduled once problem is solved.

## 2016-12-31 ENCOUNTER — Encounter: Payer: Self-pay | Admitting: Internal Medicine

## 2016-12-31 ENCOUNTER — Ambulatory Visit (INDEPENDENT_AMBULATORY_CARE_PROVIDER_SITE_OTHER): Payer: Self-pay | Admitting: Internal Medicine

## 2016-12-31 VITALS — BP 118/70 | HR 70 | Resp 16 | Ht 62.0 in | Wt 166.0 lb

## 2016-12-31 DIAGNOSIS — B9689 Other specified bacterial agents as the cause of diseases classified elsewhere: Secondary | ICD-10-CM

## 2016-12-31 DIAGNOSIS — N760 Acute vaginitis: Secondary | ICD-10-CM

## 2016-12-31 DIAGNOSIS — A630 Anogenital (venereal) warts: Secondary | ICD-10-CM | POA: Insufficient documentation

## 2016-12-31 LAB — POCT WET PREP WITH KOH
KOH Prep POC: NEGATIVE
RBC WET PREP PER HPF POC: NEGATIVE
Trichomonas, UA: NEGATIVE
YEAST WET PREP PER HPF POC: NEGATIVE

## 2016-12-31 MED ORDER — METRONIDAZOLE 500 MG PO TABS: 500.0000 mg | ORAL_TABLET | Freq: Two times a day (BID) | ORAL | 0 refills | Status: DC

## 2016-12-31 NOTE — Progress Notes (Signed)
   Subjective:    Patient ID: Kimberly Avila, female    DOB: Jan 08, 1981, 36 y.o.   MRN: [id number]  HPI   1.  Genital warts:  She thinks they may be gone.  She was using some cream from [place] called Varmisil  2.  Bleeding with intercourse for past year.  Just has the bleeding with intercourse then stops.  States no pain with intercourse.   Sometimes, has pain in RLQ when urinates, but this has no relation to intercourse. She has intercourse every 3 weeks or so.  3.  Vaginitis:  Ultimately treated with Fluconazole and pain and inflammation resolved  Current Meds  Medication Sig  . etonogestrel (NEXPLANON) 68 MG IMPL implant 1 each by Subdermal route once. 3 year removal date is 05/26/2017    Allergies  Allergen Reactions  . Gelatin Itching  . Ibuprofen Rash  . Nyquil [Pseudoeph-Doxylamine-Dm-Apap] Itching  . Tylenol [Acetaminophen] Itching    Review of Systems     Objective:   Physical Exam 2 small flat warts at posterior vaginal introitus.   Treated with liquid nitrogen Mild inflammation about the area without obvious discharge. No longer with vaginal wall inflammation.  Cervix without inflammation, scant mucoid discharge.  No CMT       Assessment & Plan:  1.  Genital warts:  Two small warts, now flat treated with liquid nitrogen.  Patient tolerated well.  2.  Bacterial Vaginosis:  Metronidazole 500 mg twice daily for 7 days.  Not clear why she is having bleeding as do not see obvious inflammation any longer. Return in 2 weeks to see if warts resolved and any problems after treatment of BV

## 2017-01-14 ENCOUNTER — Ambulatory Visit (INDEPENDENT_AMBULATORY_CARE_PROVIDER_SITE_OTHER): Payer: Self-pay | Admitting: Internal Medicine

## 2017-01-14 ENCOUNTER — Encounter: Payer: Self-pay | Admitting: Internal Medicine

## 2017-01-14 VITALS — BP 122/80 | HR 78 | Resp 12 | Ht 61.0 in | Wt 164.0 lb

## 2017-01-14 DIAGNOSIS — A630 Anogenital (venereal) warts: Secondary | ICD-10-CM

## 2017-01-14 DIAGNOSIS — K0889 Other specified disorders of teeth and supporting structures: Secondary | ICD-10-CM

## 2017-01-14 MED ORDER — PENICILLIN V POTASSIUM 250 MG PO TABS: 250.0000 mg | ORAL_TABLET | Freq: Four times a day (QID) | ORAL | 0 refills | Status: DC

## 2017-01-14 NOTE — Progress Notes (Signed)
   Subjective:     Patient ID: Kimberly Avila, female    DOB: 22-Jul-1981, 36 y.o.   MRN: [id number]  HPI   1.  Genital warts:  She feels these are gone.  2.  BV:  No vaginal discharge or odor.  States she still has a little blood with intercourse, but it is better now.  No inflammation of vaginal or cervical mucosa notes with last visit..  3.  Had some abdominal discomfort with Metronidazole, but that has resolved.  4.  Pain in molars for 4 days.  Right upper molar    Current Meds  Medication Sig  . etonogestrel (NEXPLANON) 68 MG IMPL implant 1 each by Subdermal route once. 3 year removal date is 05/26/2017    Allergies  Allergen Reactions  . Gelatin Itching  . Ibuprofen Rash  . Nyquil [Pseudoeph-Doxylamine-Dm-Apap] Itching  . Tylenol [Acetaminophen] Itching           Review of Systems     Objective:   Physical Exam Right upper posterior molar with mild inflammation of surrounding gingiva, but no fluctuance.  Tender to palpation. Neck:  Supple no adenopathy  External genitalia:  2 smaller flat warts at vaginal introitus.  Treated again with liquid nitrogen     Assessment & Plan:  Genital warts:  Treated.  Reevaluate in 2 weeks.  Possible abscessed tooth:  PCN 250 for 7 days.

## 2017-01-15 NOTE — Telephone Encounter (Signed)
Patient was treated at office 01/15/17

## 2017-01-17 ENCOUNTER — Encounter: Payer: Self-pay | Admitting: Internal Medicine

## 2017-01-29 ENCOUNTER — Ambulatory Visit: Payer: Self-pay | Admitting: Internal Medicine

## 2018-06-09 ENCOUNTER — Emergency Department (HOSPITAL_COMMUNITY)
Admission: EM | Admit: 2018-06-09 | Discharge: 2018-06-09 | Disposition: A | Discharge status: Home / Self Care | Payer: No Typology Code available for payment source | Attending: Emergency Medicine | Admitting: Emergency Medicine

## 2018-06-09 ENCOUNTER — Encounter (HOSPITAL_COMMUNITY): Payer: Self-pay

## 2018-06-09 ENCOUNTER — Other Ambulatory Visit: Payer: Self-pay

## 2018-06-09 DIAGNOSIS — G44209 Tension-type headache, unspecified, not intractable: Secondary | ICD-10-CM | POA: Insufficient documentation

## 2018-06-09 LAB — COMPREHENSIVE METABOLIC PANEL
ALT: 19 U/L (ref 14–54)
AST: 21 U/L (ref 15–41)
Albumin: 4 g/dL (ref 3.5–5.0)
Alkaline Phosphatase: 63 U/L (ref 38–126)
Anion gap: 8 (ref 5–15)
BUN: 9 mg/dL (ref 6–20)
CHLORIDE: 108 mmol/L (ref 101–111)
CO2: 24 mmol/L (ref 22–32)
CREATININE: 0.69 mg/dL (ref 0.44–1.00)
Calcium: 9.7 mg/dL (ref 8.9–10.3)
Glucose, Bld: 138 mg/dL — ABNORMAL HIGH (ref 65–99)
POTASSIUM: 4 mmol/L (ref 3.5–5.1)
Sodium: 140 mmol/L (ref 135–145)
Total Bilirubin: 1 mg/dL (ref 0.3–1.2)
Total Protein: 7.4 g/dL (ref 6.5–8.1)

## 2018-06-09 LAB — CBC WITH DIFFERENTIAL/PLATELET
Abs Immature Granulocytes: 0 10*3/uL (ref 0.0–0.1)
BASOS ABS: 0.1 10*3/uL (ref 0.0–0.1)
Basophils Relative: 1 %
EOS ABS: 0.2 10*3/uL (ref 0.0–0.7)
Eosinophils Relative: 3 %
HEMATOCRIT: 44.9 % (ref 36.0–46.0)
Hemoglobin: 14.7 g/dL (ref 12.0–15.0)
IMMATURE GRANULOCYTES: 1 %
LYMPHS ABS: 3.2 10*3/uL (ref 0.7–4.0)
LYMPHS PCT: 37 %
MCH: 28.8 pg (ref 26.0–34.0)
MCHC: 32.7 g/dL (ref 30.0–36.0)
MCV: 87.9 fL (ref 78.0–100.0)
Monocytes Absolute: 0.6 10*3/uL (ref 0.1–1.0)
Monocytes Relative: 7 %
NEUTROS PCT: 51 %
Neutro Abs: 4.4 10*3/uL (ref 1.7–7.7)
PLATELETS: 246 10*3/uL (ref 150–400)
RBC: 5.11 MIL/uL (ref 3.87–5.11)
RDW: 12.2 % (ref 11.5–15.5)
WBC: 8.5 10*3/uL (ref 4.0–10.5)

## 2018-06-09 LAB — I-STAT BETA HCG BLOOD, ED (MC, WL, AP ONLY): I-stat hCG, quantitative: <5 m[IU]/mL (ref <5)

## 2018-06-09 MED ORDER — PROCHLORPERAZINE EDISYLATE 10 MG/2ML IJ SOLN
10.0000 mg | Freq: Once | INTRAMUSCULAR | Status: AC
  Administered 2018-06-09: 10 mg via INTRAVENOUS
  Filled 2018-06-09: qty 2

## 2018-06-09 MED ORDER — LIDOCAINE 5 % EX PTCH: 1.0000 | MEDICATED_PATCH | CUTANEOUS | 0 refills | Status: DC

## 2018-06-09 MED ORDER — DIPHENHYDRAMINE HCL 50 MG/ML IJ SOLN
25.0000 mg | Freq: Once | INTRAMUSCULAR | Status: AC
  Administered 2018-06-09: 25 mg via INTRAVENOUS
  Filled 2018-06-09: qty 1

## 2018-06-09 MED ORDER — DEXAMETHASONE SODIUM PHOSPHATE 10 MG/ML IJ SOLN
10.0000 mg | Freq: Once | INTRAMUSCULAR | Status: AC
  Administered 2018-06-09: 10 mg via INTRAVENOUS
  Filled 2018-06-09: qty 1

## 2018-06-09 NOTE — Discharge Instructions (Signed)
SOLICITE ATENCIN MDICA SI: Los medicamentos no Counselling psychologistlogran aliviar los sntomas. Tiene un dolor de cabeza que es diferente del dolor de cabeza habitual. Tiene nuseas o vmitos. Tiene fiebre. SOLICITE ATENCIN MDICA DE INMEDIATO SI: El dolor se hace cada vez ms intenso. Ha vomitado repetidas veces. Presenta rigidez en el cuello. Sufre prdida de la visin. Tiene problemas para hablar. Siente dolor en el ojo o en el odo. Presenta debilidad muscular o prdida del control muscular. Pierde el equilibrio o tiene problemas para Advertising account plannercaminar. Sufre mareos o se desmaya. Se siente confundido.

## 2018-06-09 NOTE — ED Notes (Signed)
ED Provider at bedside. 

## 2018-06-09 NOTE — ED Provider Notes (Signed)
[facility] EM[facility]ENC[facility] Provider Note   CSN: [id number] Arrival date & time: 06/09/18  1651     History   Chief Complaint Chief Complaint  Patient presents with  . Headache    HPI Kimberly Avila is a 37 y.o. female who presents for evaluation of a lump on the back of her neck that she states has been present for approximately 1 month.  Patient states that the bump has been causing throbbing pain for the last 4 days that radiates from her left trapezius up to the left side of her head.  Patient states that she has taken aspirin for her pain without relief.  Patient states that her pain is now 3/10 in severity but can increase to 6/10 in severity whenever she presses on the lump.  Associated symptoms: Patient states that she has occasional blurred vision however this only happens when she becomes tired.  Patient denies changes in vision at this time. Patient denies history of fevers, chills, nausea vomiting or diarrhea, shortness of breath, abdominal pain, confusion. [employer] interpreter utilized. HPI  Past Medical History:  Diagnosis Date  . Gestational diabetes    during pregnancy    Patient Active Problem List   Diagnosis Date Noted  . Genital warts 12/31/2016  . Pregnancy 03/21/2014  . Gestational Diabetes Mellitus, class A2 03/16/2014  . Language barrier, speaks Spanish only 01/30/2014  . Desire for permanent sterilization 01/03/2014  . Group B streptococcus urinary tract infection complicating pregnancy 11/04/2013  . Grand multiparity in labor and delivery, antepartum 10/31/2013  . 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 Short interval between pregnancies complicating pregnancy, antepartum 10/10/2013  . Supervision of high-risk pregnancy 10/10/2013  . History of gestational diabetes in prior pregnancy, currently pregnant 10/10/2013  . DYSLIPIDEMIA 05/07/2010  . HEMORRHOIDS 08/22/2009    Past Surgical History:  Procedure  Laterality Date  . NO PAST SURGERIES       OB History    Gravida  6   Para  6   Term  6   Preterm      AB      Living  6     SAB      TAB      Ectopic      Multiple      Live Births  6            Home Medications    Prior to Admission medications   Medication Sig Start Date End Date Taking? Authorizing Provider  etonogestrel (NEXPLANON) 68 MG IMPL implant 1 each by Subdermal route once. 3 year removal date is 05/26/2017    [provider]  lidocaine (LIDODERM) 5 % Place 1 patch onto the skin daily. Remove & Discard patch within 12 hours or as directed by MD 06/09/18   Harlene SaltsMorelli, Alek Borges A, PA-C  penicillin v potassium (VEETID) 250 MG tablet Take 1 tablet (250 mg total) by mouth 4 (four) times daily. For 7 days 01/14/17   Julieanne MansonMulberry, Elizabeth, MD    Family History Family History  Problem Relation Age of Onset  . Diabetes Maternal Uncle     Social History Social History   Tobacco Use  . Smoking status: Never Smoker  . Smokeless tobacco: Never Used  Substance Use Topics  . Alcohol use: No  . Drug use: No     Allergies   Gelatin; Ibuprofen; Nyquil [pseudoeph-doxylamine-dm-apap]; and Tylenol [acetaminophen]   Review of Systems Review of Systems  Constitutional: Negative.  Negative for chills, fatigue and fever.  HENT: Negative.  Negative for congestion, ear pain, rhinorrhea, sore throat and trouble swallowing.   Eyes: Negative.  Negative for photophobia, pain, redness and visual disturbance.  Respiratory: Negative.  Negative for cough, chest tightness and shortness of breath.   Cardiovascular: Negative.  Negative for chest pain and leg swelling.  Gastrointestinal: Negative.  Negative for abdominal pain, blood in stool, diarrhea, nausea and vomiting.  Genitourinary: Negative.  Negative for difficulty urinating, dysuria, flank pain, hematuria, pelvic pain, vaginal bleeding and vaginal discharge.  Musculoskeletal: Positive for neck pain. Negative  for arthralgias, gait problem and neck stiffness.  Skin: Negative.  Negative for rash.  Neurological: Negative.  Negative for dizziness, syncope, weakness, light-headedness and headaches.     Physical Exam Updated Vital Signs BP 137/84 (BP Location: Right Arm)   Pulse 83   Temp 98.3 F (36.8 C) (Oral)   Resp 16   Ht 5\' 4"  (1.626 m)   Wt 72.6 kg (160 lb)   SpO2 100%   BMI 27.46 kg/m   Physical Exam  Constitutional: She is oriented to person, place, and time. She appears well-developed and well-nourished.  Non-toxic appearance. She does not appear ill. No distress.  HENT:  Head: Normocephalic and atraumatic.  Mouth/Throat: Oropharynx is clear and moist.  Eyes: Pupils are equal, round, and reactive to light. EOM are normal. Pupils are equal.  Neck: Normal range of motion. Neck supple. No neck rigidity. No tracheal deviation present.  Cardiovascular: Normal rate, regular rhythm, normal heart sounds and intact distal pulses. Exam reveals no friction rub.  No murmur heard. Pulmonary/Chest: Effort normal and breath sounds normal. No respiratory distress. She has no wheezes.  Abdominal: Soft. Bowel sounds are normal. There is no tenderness. There is no guarding.  Musculoskeletal: Normal range of motion. She exhibits tenderness. She exhibits no edema.       Left shoulder: She exhibits tenderness, pain and spasm. She exhibits normal range of motion, no swelling and no deformity.       Arms: No sign of a lump present on physical exam.  There is a tight muscle/muscle not where the patient is pointing to on her left trapezius muscle. no signs of infection, no swelling, erythema/streaking, fluctuance, warmth, ecchymosis, or any other signs of injury.  Patient complains of a great increase in pain when I press on the area left trapezius muscle, stating that pain radiates up the left side of her head and over to her left shoulder.  Patient states increase in pain when turning her head to the  left.   Lymphadenopathy:    She has no cervical adenopathy.  Neurological: She is alert and oriented to person, place, and time. She has normal strength. She displays no tremor. No cranial nerve deficit or sensory deficit. She displays a negative Romberg sign. Coordination and gait normal.  Skin: Skin is warm and dry. No rash noted. She is not diaphoretic. No erythema.  Psychiatric: She has a normal mood and affect. Her behavior is normal.     ED Treatments / Results  Labs (all labs ordered are listed, but only abnormal results are displayed) Labs Reviewed  COMPREHENSIVE METABOLIC PANEL - Abnormal; Notable for the following components:      Result Value   Glucose, Bld 138 (*)    All other components within normal limits  CBC WITH DIFFERENTIAL/PLATELET  I-STAT BETA HCG BLOOD, ED ([facility], [facility], [facility] ONLY)    EKG None  Radiology No results found.  Procedures Procedures (including critical care time)  Medications Ordered in ED Medications  dexamethasone (DECADRON) injection 10 mg (10 mg Intravenous Given 06/09/18 1842)  prochlorperazine (COMPAZINE) injection 10 mg (10 mg Intravenous Given 06/09/18 1842)  diphenhydrAMINE (BENADRYL) injection 25 mg (25 mg Intravenous Given 06/09/18 1842)     Initial Impression / Assessment and Plan / ED Course  I have reviewed the triage vital signs and the nursing notes.  Pertinent labs & imaging results that were available during my care of the patient were reviewed by me and considered in my medical decision making (see chart for details).  Clinical Course as of Jun 09 2105  Wed Jun 09, 2018  1749 Patient has not been brought back to room yet.   [BM]  [id number] Patient updated on plan via [employer] interpreter.  Patient is agreeable with with Compazine, Benadryl, Decadron.   [BM]  2017 On reevaluation patient states that she is feeling much better.   [BM]    Clinical Course User Index [BM] Bill Salinas, PA-C    [employer] video interpreter  used. Tension type headache: Patient presenting for headache and bump on neck.  Physical exam reveals no lump but instead muscular spasm of the left trapezius.  Patient acutely tender to palpation of this area, no signs of infection.  Patient treated in the emergency department with Decadron, Compazine, Benadryl.  On reevaluation patient states that she is feeling much better and her pain is now 0/10.  No signs suggesting cellulitis, abscess, or infection.  No signs or symptoms of meningeal infection/irritation, no neuro deficits, no strength or sensory deficits, patient is afebrile in department.  No visual changes, balance problems, confusion or speech difficulty suggesting intracranial processes.  Patient has no clotting risk factors.  Patient states that she is ready to be discharged home, requesting medication for if the pain returns.  Patient given prescription for Lidoderm patch and instructed on its use, patient also informed that the Decadron will continue working for the next few days to relieve her symptoms of muscle spasm.  Return precautions discussed at length with patient and patient states understanding.  All questions answered.  Patient advised to follow-up with her primary care physician and she states that she will do so in the morning.  Patient agrees with plan for discharge.  Return precautions also given to patient and after visit summary in Spanish.   Final Clinical Impressions(s) / ED Diagnoses   Final diagnoses:  Acute non intractable tension-type headache    ED Discharge Orders        Ordered    lidocaine (LIDODERM) 5 %  Every 24 hours     06/09/18 2055       Elizabeth Palau 06/09/18 2109    Gerhard Munch, MD 06/10/18 1340

## 2018-06-09 NOTE — ED Triage Notes (Addendum)
Per the spanish interpreter, pt endorses finding a bump in left posterior neck x 1 month ago, no bump seen by this RN now. Pt now has headache, blurred vision with neck pain x 4 days. Denies fever or chills. Has been taking aspirin without relief. Pt states that on Saturday she took 4-325 aspirin pills, then took 4 more aspirin pills 325mg  again 5 hours later. No neuro deficits.

## 2018-06-09 NOTE — ED Notes (Signed)
Patient Alert and oriented to baseline. Stable and ambulatory to baseline. Patient verbalized understanding of the discharge instructions.  Patient belongings were taken by the patient.   

## 2019-03-28 ENCOUNTER — Ambulatory Visit: Payer: No Typology Code available for payment source | Admitting: Primary Care

## 2019-03-28 ENCOUNTER — Ambulatory Visit (INDEPENDENT_AMBULATORY_CARE_PROVIDER_SITE_OTHER): Payer: No Typology Code available for payment source | Admitting: Primary Care

## 2019-04-13 ENCOUNTER — Other Ambulatory Visit: Payer: Self-pay

## 2019-04-13 ENCOUNTER — Emergency Department (HOSPITAL_COMMUNITY)
Admission: EM | Admit: 2019-04-13 | Discharge: 2019-04-13 | Disposition: A | Discharge status: Home / Self Care | Payer: No Typology Code available for payment source | Attending: Emergency Medicine | Admitting: Emergency Medicine

## 2019-04-13 ENCOUNTER — Encounter (HOSPITAL_COMMUNITY): Payer: Self-pay | Admitting: Emergency Medicine

## 2019-04-13 DIAGNOSIS — R110 Nausea: Secondary | ICD-10-CM | POA: Insufficient documentation

## 2019-04-13 DIAGNOSIS — Z8719 Personal history of other diseases of the digestive system: Secondary | ICD-10-CM | POA: Insufficient documentation

## 2019-04-13 DIAGNOSIS — R10811 Right upper quadrant abdominal tenderness: Secondary | ICD-10-CM | POA: Insufficient documentation

## 2019-04-13 DIAGNOSIS — R1013 Epigastric pain: Secondary | ICD-10-CM | POA: Insufficient documentation

## 2019-04-13 LAB — CBC
HCT: 40.5 % (ref 36.0–46.0)
Hemoglobin: 14 g/dL (ref 12.0–15.0)
MCH: 29.2 pg (ref 26.0–34.0)
MCHC: 34.6 g/dL (ref 30.0–36.0)
MCV: 84.4 fL (ref 80.0–100.0)
Platelets: 229 10*3/uL (ref 150–400)
RBC: 4.8 MIL/uL (ref 3.87–5.11)
RDW: 12.4 % (ref 11.5–15.5)
WBC: 10.2 10*3/uL (ref 4.0–10.5)
nRBC: 0 % (ref 0.0–0.2)

## 2019-04-13 LAB — COMPREHENSIVE METABOLIC PANEL
ALT: 22 U/L (ref 0–44)
AST: 20 U/L (ref 15–41)
Albumin: 4 g/dL (ref 3.5–5.0)
Alkaline Phosphatase: 54 U/L (ref 38–126)
Anion gap: 11 (ref 5–15)
BUN: 15 mg/dL (ref 6–20)
CO2: 25 mmol/L (ref 22–32)
Calcium: 9.8 mg/dL (ref 8.9–10.3)
Chloride: 101 mmol/L (ref 98–111)
Creatinine, Ser: 0.94 mg/dL (ref 0.44–1.00)
GFR calc Af Amer: >60 mL/min (ref >60)
GFR calc non Af Amer: >60 mL/min (ref >60)
Glucose, Bld: 112 mg/dL — ABNORMAL HIGH (ref 70–99)
Potassium: 3.1 mmol/L — ABNORMAL LOW (ref 3.5–5.1)
Sodium: 137 mmol/L (ref 135–145)
Total Bilirubin: 1.2 mg/dL (ref 0.3–1.2)
Total Protein: 7.4 g/dL (ref 6.5–8.1)

## 2019-04-13 LAB — I-STAT BETA HCG BLOOD, ED (MC, WL, AP ONLY): I-stat hCG, quantitative: <5 m[IU]/mL (ref <5)

## 2019-04-13 LAB — LIPASE, BLOOD: Lipase: 32 U/L (ref 11–51)

## 2019-04-13 MED ORDER — ONDANSETRON HCL 4 MG/2ML IJ SOLN
4.0000 mg | Freq: Once | INTRAMUSCULAR | Status: AC
  Administered 2019-04-13: 4 mg via INTRAVENOUS
  Filled 2019-04-13: qty 2

## 2019-04-13 MED ORDER — PANTOPRAZOLE SODIUM 40 MG PO TBEC
40.0000 mg | DELAYED_RELEASE_TABLET | Freq: Once | ORAL | Status: AC
  Administered 2019-04-13: 02:00:00 40 mg via ORAL
  Filled 2019-04-13: qty 1

## 2019-04-13 MED ORDER — ONDANSETRON HCL 4 MG/2ML IJ SOLN: 4.0000 mg | Freq: Once | INTRAMUSCULAR | Status: DC | PRN

## 2019-04-13 MED ORDER — MORPHINE SULFATE (PF) 4 MG/ML IV SOLN
4.0000 mg | Freq: Once | INTRAVENOUS | Status: AC
  Administered 2019-04-13: 4 mg via INTRAVENOUS
  Filled 2019-04-13: qty 1

## 2019-04-13 MED ORDER — SODIUM CHLORIDE 0.9% FLUSH
3.0000 mL | Freq: Once | INTRAVENOUS | Status: AC
  Administered 2019-04-13: 01:00:00 3 mL via INTRAVENOUS

## 2019-04-13 MED ORDER — OMEPRAZOLE 20 MG PO CPDR: 20.0000 mg | DELAYED_RELEASE_CAPSULE | Freq: Every day | ORAL | 0 refills | Status: DC

## 2019-04-13 NOTE — ED Triage Notes (Signed)
Pt c/o generalized abdominal pain with nausea. hx gallstones.

## 2019-04-13 NOTE — ED Notes (Signed)
Patient verbalizes understanding of discharge instructions. Opportunity for questioning and answers were provided. Armband removed by staff, pt discharged from ED home via POV.  

## 2019-04-13 NOTE — ED Provider Notes (Signed)
[facility] EMERGENCY DEPARTMENT Provider Note   CSN: [id number] Arrival date & time: 04/13/19  0003    History   Chief Complaint Chief Complaint  Patient presents with  . Abdominal Pain    HPI Kimberly Avila is a 38 y.o. female.     HPI  Is a 38 year old female with a history of cholelithiasis who presents with upper abdominal pain.  She reports onset of pain at 3 PM this afternoon.  It was not precipitated by eating.  It is epigastric and radiates to the right side into her back.  It is sharp in nature.  She reports nausea without vomiting.  Current pain is 7 out of 10.  She states it feels like when she had prior gallstones.  She denies any diarrhea or urinary symptoms.  She has an Implanon for birth control.  She reports chills without fevers.  Past Medical History:  Diagnosis Date  . Gestational diabetes    during pregnancy    Patient Active Problem List   Diagnosis Date Noted  . Genital warts 12/31/2016  . Pregnancy 03/21/2014  . Gestational Diabetes Mellitus, class A2 03/16/2014  . Language barrier, speaks Spanish only 01/30/2014  . Desire for permanent sterilization 01/03/2014  . Group B streptococcus urinary tract infection complicating pregnancy 11/04/2013  . Grand multiparity in labor and delivery, antepartum 10/31/2013  . 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 Short interval between pregnancies complicating pregnancy, antepartum 10/10/2013  . Supervision of high-risk pregnancy 10/10/2013  . History of gestational diabetes in prior pregnancy, currently pregnant 10/10/2013  . DYSLIPIDEMIA 05/07/2010  . HEMORRHOIDS 08/22/2009    Past Surgical History:  Procedure Laterality Date  . NO PAST SURGERIES       OB History    Gravida  6   Para  6   Term  6   Preterm      AB      Living  6     SAB      TAB      Ectopic      Multiple      Live Births  6            Home Medications    Prior to Admission  medications   Medication Sig Start Date End Date Taking? Authorizing Provider  etonogestrel (NEXPLANON) 68 MG IMPL implant 1 each by Subdermal route once. 3 year removal date is 05/26/2017   Yes [provider]  lidocaine (LIDODERM) 5 % Place 1 patch onto the skin daily. Remove & Discard patch within 12 hours or as directed by MD Patient not taking: Reported on 04/13/2019 06/09/18   Harlene Salts A, PA-C  omeprazole (PRILOSEC) 20 MG capsule Take 1 capsule (20 mg total) by mouth daily. 04/13/19   Mafalda Mcginniss, Mayer Masker, MD  penicillin v potassium (VEETID) 250 MG tablet Take 1 tablet (250 mg total) by mouth 4 (four) times daily. For 7 days Patient not taking: Reported on 04/13/2019 01/14/17   Julieanne Manson, MD    Family History Family History  Problem Relation Age of Onset  . Diabetes Maternal Uncle     Social History Social History   Tobacco Use  . Smoking status: Never Smoker  . Smokeless tobacco: Never Used  Substance Use Topics  . Alcohol use: No  . Drug use: No     Allergies   Gelatin; Ibuprofen; Nyquil [pseudoeph-doxylamine-dm-apap]; and Tylenol [acetaminophen]   Review of Systems Review of Systems  Constitutional:  Positive for chills. Negative for fever.  Respiratory: Negative for shortness of breath.   Cardiovascular: Negative for chest pain.  Gastrointestinal: Positive for abdominal pain and nausea. Negative for diarrhea and vomiting.  Genitourinary: Negative for dysuria.  Neurological: Negative for headaches.  All other systems reviewed and are negative.    Physical Exam Updated Vital Signs BP 109/70   Pulse 62   Temp (!) 97.5 F (36.4 C) (Oral)   Resp 16   SpO2 99%   Physical Exam Vitals signs and nursing note reviewed.  Constitutional:      Appearance: She is well-developed.     Comments: Overweight  HENT:     Head: Normocephalic and atraumatic.  Eyes:     Pupils: Pupils are equal, round, and reactive to light.  Neck:      Musculoskeletal: Neck supple.  Cardiovascular:     Rate and Rhythm: Normal rate and regular rhythm.  Pulmonary:     Effort: Pulmonary effort is normal. No respiratory distress.  Abdominal:     Palpations: Abdomen is soft.     Tenderness: There is abdominal tenderness in the right upper quadrant and epigastric area. There is no guarding or rebound. Negative signs include Murphy's sign.  Skin:    General: Skin is warm and dry.  Neurological:     Mental Status: She is alert and oriented to person, place, and time.  Psychiatric:        Mood and Affect: Mood normal.      ED Treatments / Results  Labs (all labs ordered are listed, but only abnormal results are displayed) Labs Reviewed  COMPREHENSIVE METABOLIC PANEL - Abnormal; Notable for the following components:      Result Value   Potassium 3.1 (*)    Glucose, Bld 112 (*)    All other components within normal limits  LIPASE, BLOOD  CBC  URINALYSIS, ROUTINE W REFLEX MICROSCOPIC  I-STAT BETA HCG BLOOD, ED ([facility], [facility], [facility] ONLY)    EKG None  Radiology No results found.  Procedures Procedures (including critical care time)  Medications Ordered in ED Medications  ondansetron (ZOFRAN) injection 4 mg (has no administration in time range)  sodium chloride flush (NS) 0.9 % injection 3 mL (3 mLs Intravenous Given 04/13/19 0036)  morphine 4 MG/ML injection 4 mg (4 mg Intravenous Given 04/13/19 0046)  ondansetron (ZOFRAN) injection 4 mg (4 mg Intravenous Given 04/13/19 0046)  pantoprazole (PROTONIX) EC tablet 40 mg (40 mg Oral Given 04/13/19 0212)     Initial Impression / Assessment and Plan / ED Course  I have reviewed the triage vital signs and the nursing notes.  Pertinent labs & imaging results that were available during my care of the patient were reviewed by me and considered in my medical decision making (see chart for details).        Patient presents with epigastric pain.  She is nontoxic appearing on exam and vital  signs are reassuring.  She has tenderness to palpation of the epigastrium.  No Murphy sign.  I have reviewed her chart.  She has had an ultrasound previously that showed cholelithiasis.  She has not followed up with general surgery.  While her symptoms are not related to food, this is a consideration.  Reflux and pancreatitis are also a consideration.  Patient was given pain and nausea medication.  Lab work obtained.  No significant leukocytosis.  Liver function tests are normal.  On recheck, patient feels much improved.  She was able to tolerate  fluids and was given Protonix.  At this time given reassuring lab work and repeat exam, do not feel she needs ultrasound.  This could be biliary colic versus reflux.  General surgery follow-up provided given history and prior ultrasound.  She will also be discharged home with Protonix.  After history, exam, and medical workup I feel the patient has been appropriately medically screened and is safe for discharge home. Pertinent diagnoses were discussed with the patient. Patient was given return precautions.  Final Clinical Impressions(s) / ED Diagnoses   Final diagnoses:  Epigastric pain  History of gallstones    ED Discharge Orders         Ordered    omeprazole (PRILOSEC) 20 MG capsule  Daily     04/13/19 0240           Shon BatonHorton, Dantavious Snowball F, MD 04/13/19 [phone]

## 2019-05-11 ENCOUNTER — Ambulatory Visit: Payer: Self-pay | Attending: Primary Care | Admitting: Primary Care

## 2019-05-11 ENCOUNTER — Other Ambulatory Visit: Payer: Self-pay

## 2019-05-11 VITALS — BP 116/85 | HR 82 | Temp 98.7°F | Resp 18 | Ht 62.0 in | Wt 169.0 lb

## 2019-05-11 DIAGNOSIS — O99810 Abnormal glucose complicating pregnancy: Secondary | ICD-10-CM

## 2019-05-11 DIAGNOSIS — Z7689 Persons encountering health services in other specified circumstances: Secondary | ICD-10-CM

## 2019-05-11 DIAGNOSIS — Z09 Encounter for follow-up examination after completed treatment for conditions other than malignant neoplasm: Secondary | ICD-10-CM

## 2019-05-11 DIAGNOSIS — N921 Excessive and frequent menstruation with irregular cycle: Secondary | ICD-10-CM

## 2019-05-11 NOTE — Progress Notes (Signed)
New Patient Office Visit  Subjective:  Patient ID: Kimberly Avila, female    DOB: 1980/12/23  Age: 38 y.o. MRN: [id number]  CC: No chief complaint on file.   HPI Kimberly Avila presents to establish care and ED d/c. She was seen for abdominal pain . PMH: cholelithiasis, non intractable tension-type headaches   Past Medical History:  Diagnosis Date  . Gestational diabetes    during pregnancy    Past Surgical History:  Procedure Laterality Date  . NO PAST SURGERIES      Family History  Problem Relation Age of Onset  . Diabetes Maternal Uncle     Social History   Socioeconomic History  . Marital status: Married    Spouse name: Not on file  . Number of children: Not on file  . Years of education: Not on file  . Highest education level: Not on file  Occupational History  . Not on file  Social Needs  . Financial resource strain: Not on file  . Food insecurity:    Worry: Not on file    Inability: Not on file  . Transportation needs:    Medical: Not on file    Non-medical: Not on file  Tobacco Use  . Smoking status: Never Smoker  . Smokeless tobacco: Never Used  Substance and Sexual Activity  . Alcohol use: No  . Drug use: No  . Sexual activity: Yes    Birth control/protection: None, Implant    Comment: wants birth control unable to pay for tubal  Lifestyle  . Physical activity:    Days per week: Not on file    Minutes per session: Not on file  . Stress: Not on file  Relationships  . Social connections:    Talks on phone: Not on file    Gets together: Not on file    Attends religious service: Not on file    Active member of club or organization: Not on file    Attends meetings of clubs or organizations: Not on file    Relationship status: Not on file  . Intimate partner violence:    Fear of current or ex partner: Not on file    Emotionally abused: Not on file    Physically abused: Not on file    Forced sexual activity: Not on file  Other  Topics Concern  . Not on file  Social History Narrative   ** Merged History Encounter **        ROS Review of Systems  Constitutional: Negative.   HENT: Negative.   Eyes: Negative.   Respiratory: Negative.   Cardiovascular: Negative.   Gastrointestinal: Positive for abdominal distention.  Endocrine: Negative.   Genitourinary: Negative.   Musculoskeletal: Negative.   Allergic/Immunologic: Negative.   Neurological: Positive for headaches.  Hematological: Negative.   Psychiatric/Behavioral: Negative.     Objective:   Today's Vitals: BP 116/85 (BP Location: Left Arm, Patient Position: Sitting, Cuff Size: Normal)   Pulse 82   Temp 98.7 F (37.1 C) (Oral)   Resp 18   Ht 5\' 2"  (1.575 m)   Wt 169 lb (76.7 kg)   SpO2 98%   BMI 30.91 kg/m   Physical Exam Vitals signs and nursing note reviewed.  Constitutional:      Appearance: Normal appearance.  HENT:     Head: Normocephalic and atraumatic.     Right Ear: Tympanic membrane normal.     Left Ear: Tympanic membrane normal.  Neck:  Musculoskeletal: Normal range of motion and neck supple.  Cardiovascular:     Rate and Rhythm: Normal rate and regular rhythm.  Pulmonary:     Effort: Pulmonary effort is normal.     Breath sounds: Normal breath sounds.  Abdominal:     General: Bowel sounds are normal.     Palpations: Abdomen is soft.  Musculoskeletal: Normal range of motion.  Skin:    General: Skin is warm and dry.  Neurological:     Mental Status: She is alert and oriented to person, place, and time.  Psychiatric:        Mood and Affect: Mood normal.     Assessment & Plan:   Problem List Items Addressed This Visit    Gestational Diabetes Mellitus, class A2 - Primary   Relevant Orders   Comprehensive metabolic panel (Completed)   Hemoglobin A1c (Completed)    Other Visit Diagnoses    Menorrhagia with irregular cycle       Relevant Orders   CBC with Differential (Completed)   US Pelvic Complete With  Transvaginal   Urine cytology ancillary only   HIV antibody (with reflex) (Completed)   Cervicovaginal ancillary only (Completed)   Encounter to establish care       Hospital discharge follow-up          Outpatient Encounter Medications as of 05/11/2019  Medication Sig  . etonogestrel (NEXPLANON) 68 MG IMPL implant 1 each by Subdermal route once. 3 year removal date is 05/26/2017  . lidocaine (LIDODERM) 5 % Place 1 patch onto the skin daily. Remove & Discard patch within 12 hours or as directed by MD (Patient not taking: Reported on 04/13/2019)  . omeprazole (PRILOSEC) 20 MG capsule Take 1 capsule (20 mg total) by mouth daily.  . penicillin v potassium (VEETID) 250 MG tablet Take 1 tablet (250 mg total) by mouth 4 (four) times daily. For 7 days (Patient not taking: Reported on 04/13/2019)   No facility-administered encounter medications on file as of 05/11/2019.   Diagnoses and all orders for this visit:  Gestational Diabetes Mellitus, class A2 -     Comprehensive metabolic panel -     Hemoglobin A1c  Menorrhagia with irregular cycle -     CBC with Differential -     US Pelvic Complete With Transvaginal; Future -     Urine cytology ancillary only -     HIV antibody (with reflex) -     Cervicovaginal ancillary only  Encounter to establish care Establish care with new provider previously followed [facility] discharge follow-up Provided in Spanish financial assistance form to assist with care. Order a Pelvic ultrasound from exam and complaints    Follow-up: Return in about 1 month (around 06/11/2019) for mennorrahgia.   Grayce Sessions, NP

## 2019-05-12 ENCOUNTER — Other Ambulatory Visit: Payer: Self-pay | Admitting: Primary Care

## 2019-05-12 LAB — CERVICOVAGINAL ANCILLARY ONLY
Bacterial vaginitis: POSITIVE — AB
Candida vaginitis: NEGATIVE
Chlamydia: NEGATIVE
Neisseria Gonorrhea: NEGATIVE
Trichomonas: NEGATIVE

## 2019-05-12 LAB — COMPREHENSIVE METABOLIC PANEL
ALT: 26 IU/L (ref 0–32)
AST: 22 IU/L (ref 0–40)
Albumin/Globulin Ratio: 1.7 (ref 1.2–2.2)
Albumin: 4.8 g/dL (ref 3.8–4.8)
Alkaline Phosphatase: 73 IU/L (ref 39–117)
BUN/Creatinine Ratio: 15 (ref 9–23)
BUN: 9 mg/dL (ref 6–20)
Bilirubin Total: 1.2 mg/dL (ref 0.0–1.2)
CO2: 19 mmol/L — ABNORMAL LOW (ref 20–29)
Calcium: 9.9 mg/dL (ref 8.7–10.2)
Chloride: 104 mmol/L (ref 96–106)
Creatinine, Ser: 0.6 mg/dL (ref 0.57–1.00)
GFR calc Af Amer: 136 mL/min/{1.73_m2} (ref >59)
GFR calc non Af Amer: 118 mL/min/{1.73_m2} (ref >59)
Globulin, Total: 2.9 g/dL (ref 1.5–4.5)
Glucose: 88 mg/dL (ref 65–99)
Potassium: 4.4 mmol/L (ref 3.5–5.2)
Sodium: 140 mmol/L (ref 134–144)
Total Protein: 7.7 g/dL (ref 6.0–8.5)

## 2019-05-12 LAB — CBC WITH DIFFERENTIAL/PLATELET
Basophils Absolute: 0.1 10*3/uL (ref 0.0–0.2)
Basos: 1 %
EOS (ABSOLUTE): 0.2 10*3/uL (ref 0.0–0.4)
Eos: 3 %
Hematocrit: 41.8 % (ref 34.0–46.6)
Hemoglobin: 14.6 g/dL (ref 11.1–15.9)
Immature Grans (Abs): 0 10*3/uL (ref 0.0–0.1)
Immature Granulocytes: 0 %
Lymphocytes Absolute: 2.4 10*3/uL (ref 0.7–3.1)
Lymphs: 29 %
MCH: 29.3 pg (ref 26.6–33.0)
MCHC: 34.9 g/dL (ref 31.5–35.7)
MCV: 84 fL (ref 79–97)
Monocytes Absolute: 0.6 10*3/uL (ref 0.1–0.9)
Monocytes: 8 %
Neutrophils Absolute: 4.8 10*3/uL (ref 1.4–7.0)
Neutrophils: 59 %
Platelets: 298 10*3/uL (ref 150–450)
RBC: 4.99 x10E6/uL (ref 3.77–5.28)
RDW: 12.3 % (ref 11.7–15.4)
WBC: 8.1 10*3/uL (ref 3.4–10.8)

## 2019-05-12 LAB — HEMOGLOBIN A1C
Est. average glucose Bld gHb Est-mCnc: 105 mg/dL
Hgb A1c MFr Bld: 5.3 % (ref 4.8–5.6)

## 2019-05-12 LAB — HIV ANTIBODY (ROUTINE TESTING W REFLEX): HIV Screen 4th Generation wRfx: NONREACTIVE

## 2019-05-12 MED ORDER — METRONIDAZOLE 500 MG PO TABS: 500.0000 mg | ORAL_TABLET | Freq: Two times a day (BID) | ORAL | 0 refills | Status: AC

## 2019-05-13 ENCOUNTER — Encounter: Payer: Self-pay | Admitting: Primary Care

## 2019-05-18 NOTE — Progress Notes (Signed)
Patient has been made aware of BV meds being sent to the pharmacy and to complete the Tx

## 2019-06-13 ENCOUNTER — Ambulatory Visit (INDEPENDENT_AMBULATORY_CARE_PROVIDER_SITE_OTHER): Payer: No Typology Code available for payment source | Admitting: Primary Care

## 2019-06-17 ENCOUNTER — Encounter: Payer: Self-pay | Admitting: Primary Care

## 2019-06-17 ENCOUNTER — Ambulatory Visit: Payer: Self-pay | Attending: Primary Care | Admitting: Primary Care

## 2019-06-17 ENCOUNTER — Other Ambulatory Visit: Payer: Self-pay

## 2019-06-17 ENCOUNTER — Telehealth: Payer: Self-pay | Admitting: Internal Medicine

## 2019-06-17 ENCOUNTER — Ambulatory Visit: Payer: No Typology Code available for payment source | Admitting: Primary Care

## 2019-06-17 VITALS — BP 114/77 | HR 66 | Temp 98.6°F | Ht 62.0 in | Wt 169.8 lb

## 2019-06-17 DIAGNOSIS — Z113 Encounter for screening for infections with a predominantly sexual mode of transmission: Secondary | ICD-10-CM

## 2019-06-17 DIAGNOSIS — Z124 Encounter for screening for malignant neoplasm of cervix: Secondary | ICD-10-CM

## 2019-06-17 DIAGNOSIS — N921 Excessive and frequent menstruation with irregular cycle: Secondary | ICD-10-CM

## 2019-06-17 NOTE — Progress Notes (Signed)
Allergies  Allergen Reactions  . Gelatin Itching  . Ibuprofen Rash  . Nyquil [Pseudoeph-Doxylamine-Dm-Apap] Itching  . Tylenol [Acetaminophen] Itching

## 2019-06-17 NOTE — Telephone Encounter (Signed)
Pt doesn't work, her husband doesn't get pay stubs because he gets paid in cash was wondering what she could do so she can get financial help...please follow up

## 2019-06-17 NOTE — Patient Instructions (Signed)
Sangrado uterino anormal Abnormal Uterine Bleeding El sangrado uterino anormal sucede cuando el sangrado del tero es ms duradero o ms abundante que lo normal. Esto puede incluir lo siguiente:  Hemorragias entre los perodos menstruales.  Sangrado luego de mantener relaciones sexuales.  Sangrado ms abundante que lo normal.  Perodos que duran ms de lo normal.  Sangrado durante la menopausia. Esta afeccin puede ser causada por muchos problemas. Si tiene un sangrado que no considera normal, debe consultar al mdico. El tratamiento depende de la causa del sangrado. Siga estas indicaciones en su casa:  Controle su afeccin para detectar cualquier cambio.  No use tampones, no se haga duchas vaginales ni mantenga relaciones sexuales si su mdico le dice que no lo haga.  Cambie los apsitos con frecuencia.  Realcese los exmenes de rutina para mujeres. Asegrese de hacerse un examen plvico y de control de cncer de cuello uterino.  Concurra a todas las visitas de control como se lo haya indicado el mdico. Esto es importante. Comunquese con un mdico si:  El sangrado dura ms de una semana.  Se siente mareada por momentos.  Siente que va a vomitar (nuseas).  Vomita. Solicite ayuda de inmediato si:  Se desmaya.  Debe cambiarse los apsitos cada hora.  Siente dolor de estmago (abdominal).  Tiene fiebre.  Transpira.  Se debilita.  Elimina cogulos de sangre grandes por la vagina. Resumen  El sangrado uterino anormal sucede cuando el sangrado del tero es ms duradero o ms abundante que lo normal.  Esta afeccin puede ser causada por muchos problemas. Si tiene un sangrado que no considera normal, debe consultar al mdico.  El tratamiento depende de la causa del sangrado. Esta informacin no tiene como fin reemplazar el consejo del mdico. Asegrese de hacerle al mdico cualquier pregunta que tenga. Document Released: 01/10/2011 Document Revised: 06/02/2017  Document Reviewed: 06/02/2017 Elsevier Interactive Patient Education  2019 Elsevier Inc.  

## 2019-06-17 NOTE — Progress Notes (Signed)
Acute Office Visit  Subjective:    Patient ID: Kimberly Avila, female    DOB: 17-Mar-1981, 38 y.o.   MRN: [id number]  Chief Complaint  Patient presents with  . Menorrhagia    HPI Patient is in today for abnormal bleeding only after having intercourse.  Birth control is Nexplanon placed 2019 this method of birth control can calls irregular bleeding, reviewed up-to-date suggested cytology and a pelvic exam to rule endometriosis, uterine fibroids, hyperplasia, PID and cystitis,   Past Medical History:  Diagnosis Date  . Gestational diabetes    during pregnancy    Past Surgical History:  Procedure Laterality Date  . NO PAST SURGERIES      Family History  Problem Relation Age of Onset  . Diabetes Maternal Uncle     Social History   Socioeconomic History  . Marital status: Married    Spouse name: Not on file  . Number of children: Not on file  . Years of education: Not on file  . Highest education level: Not on file  Occupational History  . Not on file  Social Needs  . Financial resource strain: Not on file  . Food insecurity    Worry: Not on file    Inability: Not on file  . Transportation needs    Medical: Not on file    Non-medical: Not on file  Tobacco Use  . Smoking status: Never Smoker  . Smokeless tobacco: Never Used  Substance and Sexual Activity  . Alcohol use: No  . Drug use: No  . Sexual activity: Yes    Birth control/protection: None, Implant    Comment: wants birth control unable to pay for tubal  Lifestyle  . Physical activity    Days per week: Not on file    Minutes per session: Not on file  . Stress: Not on file  Relationships  . Social Musicianconnections    Talks on phone: Not on file    Gets together: Not on file    Attends religious service: Not on file    Active member of club or organization: Not on file    Attends meetings of clubs or organizations: Not on file    Relationship status: Not on file  . Intimate partner violence   Fear of current or ex partner: Not on file    Emotionally abused: Not on file    Physically abused: Not on file    Forced sexual activity: Not on file  Other Topics Concern  . Not on file  Social History Narrative   ** Merged History Encounter **        Outpatient Medications Prior to Visit  Medication Sig Dispense Refill  . etonogestrel (NEXPLANON) 68 MG IMPL implant 1 each by Subdermal route once. 3 year removal date is 05/26/2017    . lidocaine (LIDODERM) 5 % Place 1 patch onto the skin daily. Remove & Discard patch within 12 hours or as directed by MD (Patient not taking: Reported on 04/13/2019) 15 patch 0  . omeprazole (PRILOSEC) 20 MG capsule Take 1 capsule (20 mg total) by mouth daily. 30 capsule 0  . penicillin v potassium (VEETID) 250 MG tablet Take 1 tablet (250 mg total) by mouth 4 (four) times daily. For 7 days (Patient not taking: Reported on 04/13/2019) 28 tablet 0   No facility-administered medications prior to visit.     Allergies  Allergen Reactions  . Gelatin Itching  . Ibuprofen Rash  . Nyquil [Pseudoeph-Doxylamine-Dm-Apap] Itching  .  Tylenol [Acetaminophen] Itching    Review of Systems  All other systems reviewed and are negative.      Objective:    Physical Exam  Constitutional: She is oriented to person, place, and time. She appears well-developed and well-nourished.  Cardiovascular: Normal rate and regular rhythm.  Pulmonary/Chest: Effort normal and breath sounds normal.  Abdominal: Soft. Bowel sounds are normal.  Genitourinary: Uterus is tender. Cervix exhibits friability.  Neurological: She is oriented to person, place, and time.    BP 114/77 (BP Location: Left Arm, Patient Position: Sitting, Cuff Size: Normal)   Pulse 66   Temp 98.6 F (37 C) (Oral)   Ht 5\' 2"  (1.575 m)   Wt 169 lb 12.8 oz (77 kg)   SpO2 97%   BMI 31.06 kg/m  Wt Readings from Last 3 Encounters:  06/17/19 169 lb 12.8 oz (77 kg)  05/11/19 169 lb (76.7 kg)  06/09/18 160 lb  (72.6 kg)    Health Maintenance Due  Topic Date Due  . PNEUMOCOCCAL POLYSACCHARIDE VACCINE AGE 22-64 HIGH RISK  04/04/1985  . FOOT EXAM  04/04/1993  . OPHTHALMOLOGY EXAM  04/04/1993  . URINE MICROALBUMIN  04/04/1993  . PAP SMEAR-Modifier  10/10/2016    There are no preventive care reminders to display for this patient.   No results found for: TSH Lab Results  Component Value Date   WBC 8.1 05/11/2019   HGB 14.6 05/11/2019   HCT 41.8 05/11/2019   MCV 84 05/11/2019   PLT 298 05/11/2019   Lab Results  Component Value Date   NA 140 05/11/2019   K 4.4 05/11/2019   CO2 19 (L) 05/11/2019   GLUCOSE 88 05/11/2019   BUN 9 05/11/2019   CREATININE 0.60 05/11/2019   BILITOT 1.2 05/11/2019   ALKPHOS 73 05/11/2019   AST 22 05/11/2019   ALT 26 05/11/2019   PROT 7.7 05/11/2019   ALBUMIN 4.8 05/11/2019   CALCIUM 9.9 05/11/2019   ANIONGAP 11 04/13/2019   Lab Results  Component Value Date   CHOL 124 05/08/2010   Lab Results  Component Value Date   HDL 50 05/08/2010   Lab Results  Component Value Date   LDLCALC 54 05/08/2010   Lab Results  Component Value Date   TRIG 101 05/08/2010   Lab Results  Component Value Date   CHOLHDL 2.5 Ratio 05/08/2010   Lab Results  Component Value Date   HGBA1C 5.3 05/11/2019       Assessment & Plan:   Problem List Items Addressed This Visit    None    Visit Diagnoses    Cervical cancer screening    -  Primary   Relevant Orders   Cytology - PAP([place])   Screen for STD (sexually transmitted disease)       Relevant Orders   Cervicovaginal ancillary only   Menorrhagia with irregular cycle         Altair was seen today for menorrhagia.  Diagnoses and all orders for this visit:  Reeve was seen today for menorrhagia.  Diagnoses and all orders for this visit:  Cervical cancer screening -     Cytology - PAP([place])  Screen for STD (sexually transmitted disease) -     Cervicovaginal ancillary only  Menorrhagia  with irregular cycle Review of up-to-date indicated performing a vaginal exam to rule out pelvic inflammatory disease, cytology, endometriosis and uterine fibroids.  Patient states the bleeding is only after intercourse with her husband.    No orders  of the defined types were placed in this encounter.    Grayce SessionsMichelle P Dalphine Cowie, NP

## 2019-06-17 NOTE — Progress Notes (Signed)
Pt states she only has heavy bleeding after having relations with her husband Pt does not have a menstrual cycle as she is on nexplanon

## 2019-06-20 ENCOUNTER — Ambulatory Visit (INDEPENDENT_AMBULATORY_CARE_PROVIDER_SITE_OTHER): Payer: No Typology Code available for payment source | Admitting: Primary Care

## 2019-06-20 NOTE — Telephone Encounter (Signed)
Pt was left VM informed her that her husband need a notarize letter from her employer, staying how much he is making a week or months for the last 3 months, also more information is on the application

## 2019-06-21 LAB — CERVICOVAGINAL ANCILLARY ONLY
Bacterial vaginitis: POSITIVE — AB
Candida vaginitis: NEGATIVE
Trichomonas: NEGATIVE

## 2019-06-22 LAB — CYTOLOGY - PAP
Diagnosis: NEGATIVE
HPV: NOT DETECTED

## 2019-06-25 ENCOUNTER — Encounter (HOSPITAL_COMMUNITY): Admission: EM | Disposition: A | Discharge status: Home / Self Care | Payer: Self-pay | Source: Home / Self Care

## 2019-06-25 ENCOUNTER — Other Ambulatory Visit: Payer: Self-pay

## 2019-06-25 ENCOUNTER — Emergency Department (HOSPITAL_COMMUNITY): Payer: Self-pay

## 2019-06-25 ENCOUNTER — Encounter (HOSPITAL_COMMUNITY): Payer: Self-pay | Admitting: *Deleted

## 2019-06-25 ENCOUNTER — Inpatient Hospital Stay (HOSPITAL_COMMUNITY): Payer: Self-pay | Admitting: Certified Registered Nurse Anesthetist

## 2019-06-25 ENCOUNTER — Inpatient Hospital Stay (HOSPITAL_COMMUNITY)
Admission: EM | Admit: 2019-06-25 | Discharge: 2019-06-25 | DRG: 419 | Disposition: A | Discharge status: Home / Self Care | Payer: Self-pay | Attending: General Surgery | Admitting: General Surgery

## 2019-06-25 DIAGNOSIS — K8000 Calculus of gallbladder with acute cholecystitis without obstruction: Principal | ICD-10-CM | POA: Diagnosis present

## 2019-06-25 DIAGNOSIS — Z20828 Contact with and (suspected) exposure to other viral communicable diseases: Secondary | ICD-10-CM

## 2019-06-25 DIAGNOSIS — E876 Hypokalemia: Secondary | ICD-10-CM | POA: Diagnosis present

## 2019-06-25 DIAGNOSIS — Z8632 Personal history of gestational diabetes: Secondary | ICD-10-CM

## 2019-06-25 DIAGNOSIS — Z888 Allergy status to other drugs, medicaments and biological substances status: Secondary | ICD-10-CM

## 2019-06-25 DIAGNOSIS — Z833 Family history of diabetes mellitus: Secondary | ICD-10-CM

## 2019-06-25 DIAGNOSIS — R109 Unspecified abdominal pain: Secondary | ICD-10-CM

## 2019-06-25 HISTORY — PX: CHOLECYSTECTOMY: SHX55

## 2019-06-25 LAB — CBC
HCT: 42.5 % (ref 36.0–46.0)
Hemoglobin: 14.5 g/dL (ref 12.0–15.0)
MCH: 29.3 pg (ref 26.0–34.0)
MCHC: 34.1 g/dL (ref 30.0–36.0)
MCV: 85.9 fL (ref 80.0–100.0)
Platelets: 259 10*3/uL (ref 150–400)
RBC: 4.95 MIL/uL (ref 3.87–5.11)
RDW: 12.1 % (ref 11.5–15.5)
WBC: 12.2 10*3/uL — ABNORMAL HIGH (ref 4.0–10.5)
nRBC: 0 % (ref 0.0–0.2)

## 2019-06-25 LAB — SARS CORONAVIRUS 2 BY RT PCR (HOSPITAL ORDER, PERFORMED IN ~~LOC~~ HOSPITAL LAB): SARS Coronavirus 2: NEGATIVE

## 2019-06-25 LAB — URINALYSIS, ROUTINE W REFLEX MICROSCOPIC
Bilirubin Urine: NEGATIVE
Glucose, UA: NEGATIVE mg/dL
Hgb urine dipstick: NEGATIVE
Ketones, ur: NEGATIVE mg/dL
Leukocytes,Ua: NEGATIVE
Nitrite: NEGATIVE
Protein, ur: NEGATIVE mg/dL
Specific Gravity, Urine: 1.004 — ABNORMAL LOW (ref 1.005–1.030)
pH: 8 (ref 5.0–8.0)

## 2019-06-25 LAB — COMPREHENSIVE METABOLIC PANEL
ALT: 28 U/L (ref 0–44)
AST: 27 U/L (ref 15–41)
Albumin: 4 g/dL (ref 3.5–5.0)
Alkaline Phosphatase: 64 U/L (ref 38–126)
Anion gap: 10 (ref 5–15)
BUN: 5 mg/dL — ABNORMAL LOW (ref 6–20)
CO2: 21 mmol/L — ABNORMAL LOW (ref 22–32)
Calcium: 9.1 mg/dL (ref 8.9–10.3)
Chloride: 104 mmol/L (ref 98–111)
Creatinine, Ser: 0.61 mg/dL (ref 0.44–1.00)
GFR calc Af Amer: >60 mL/min (ref >60)
GFR calc non Af Amer: >60 mL/min (ref >60)
Glucose, Bld: 106 mg/dL — ABNORMAL HIGH (ref 70–99)
Potassium: 3.3 mmol/L — ABNORMAL LOW (ref 3.5–5.1)
Sodium: 135 mmol/L (ref 135–145)
Total Bilirubin: 1.2 mg/dL (ref 0.3–1.2)
Total Protein: 7.2 g/dL (ref 6.5–8.1)

## 2019-06-25 LAB — I-STAT BETA HCG BLOOD, ED (MC, WL, AP ONLY): I-stat hCG, quantitative: <5 m[IU]/mL (ref <5)

## 2019-06-25 LAB — LIPASE, BLOOD: Lipase: 28 U/L (ref 11–51)

## 2019-06-25 LAB — SURGICAL PCR SCREEN
MRSA, PCR: NEGATIVE
Staphylococcus aureus: NEGATIVE

## 2019-06-25 SURGERY — LAPAROSCOPIC CHOLECYSTECTOMY WITH INTRAOPERATIVE CHOLANGIOGRAM
Anesthesia: General | Site: Abdomen

## 2019-06-25 MED ORDER — ROCURONIUM BROMIDE 10 MG/ML (PF) SYRINGE
PREFILLED_SYRINGE | INTRAVENOUS | Status: AC
  Filled 2019-06-25: qty 20

## 2019-06-25 MED ORDER — DIPHENHYDRAMINE HCL 50 MG/ML IJ SOLN
INTRAMUSCULAR | Status: DC | PRN
  Administered 2019-06-25: 12.5 mg via INTRAVENOUS

## 2019-06-25 MED ORDER — FENTANYL CITRATE (PF) 250 MCG/5ML IJ SOLN
INTRAMUSCULAR | Status: AC
  Filled 2019-06-25: qty 5

## 2019-06-25 MED ORDER — PHENYLEPHRINE 40 MCG/ML (10ML) SYRINGE FOR IV PUSH (FOR BLOOD PRESSURE SUPPORT)
PREFILLED_SYRINGE | INTRAVENOUS | Status: DC | PRN
  Administered 2019-06-25: 40 ug via INTRAVENOUS
  Administered 2019-06-25: 80 ug via INTRAVENOUS

## 2019-06-25 MED ORDER — DEXAMETHASONE SODIUM PHOSPHATE 10 MG/ML IJ SOLN
INTRAMUSCULAR | Status: DC | PRN
  Administered 2019-06-25: 4 mg via INTRAVENOUS

## 2019-06-25 MED ORDER — PHENYLEPHRINE 40 MCG/ML (10ML) SYRINGE FOR IV PUSH (FOR BLOOD PRESSURE SUPPORT)
PREFILLED_SYRINGE | INTRAVENOUS | Status: AC
  Filled 2019-06-25: qty 30

## 2019-06-25 MED ORDER — PROPOFOL 10 MG/ML IV BOLUS
INTRAVENOUS | Status: AC
  Filled 2019-06-25: qty 20

## 2019-06-25 MED ORDER — BUPIVACAINE-EPINEPHRINE 0.25% -1:200000 IJ SOLN
INTRAMUSCULAR | Status: DC | PRN
  Administered 2019-06-25: 10 mL

## 2019-06-25 MED ORDER — LIDOCAINE HCL (CARDIAC) PF 100 MG/5ML IV SOSY
PREFILLED_SYRINGE | INTRAVENOUS | Status: DC | PRN
  Administered 2019-06-25: 80 mg via INTRAVENOUS

## 2019-06-25 MED ORDER — OXYCODONE HCL 5 MG PO TABS: 5.0000 mg | ORAL_TABLET | ORAL | 0 refills | Status: DC | PRN

## 2019-06-25 MED ORDER — OXYCODONE HCL 5 MG PO TABS
5.0000 mg | ORAL_TABLET | ORAL | Status: DC | PRN
  Administered 2019-06-25 (×2): 10 mg via ORAL
  Filled 2019-06-25 (×2): qty 2

## 2019-06-25 MED ORDER — PROPOFOL 10 MG/ML IV BOLUS
INTRAVENOUS | Status: DC | PRN
  Administered 2019-06-25: 20 mg via INTRAVENOUS
  Administered 2019-06-25: 160 mg via INTRAVENOUS

## 2019-06-25 MED ORDER — ONDANSETRON HCL 4 MG/2ML IJ SOLN: 4.0000 mg | Freq: Once | INTRAMUSCULAR | Status: DC

## 2019-06-25 MED ORDER — MIDAZOLAM HCL 5 MG/5ML IJ SOLN
INTRAMUSCULAR | Status: DC | PRN
  Administered 2019-06-25 (×2): 1 mg via INTRAVENOUS

## 2019-06-25 MED ORDER — ONDANSETRON HCL 4 MG/2ML IJ SOLN
INTRAMUSCULAR | Status: AC
  Filled 2019-06-25: qty 2

## 2019-06-25 MED ORDER — LACTATED RINGERS IV SOLN: INTRAVENOUS | Status: DC

## 2019-06-25 MED ORDER — DIPHENHYDRAMINE HCL 12.5 MG/5ML PO ELIX: 12.5000 mg | ORAL_SOLUTION | Freq: Four times a day (QID) | ORAL | Status: DC | PRN

## 2019-06-25 MED ORDER — BUPIVACAINE-EPINEPHRINE (PF) 0.25% -1:200000 IJ SOLN
INTRAMUSCULAR | Status: AC
  Filled 2019-06-25: qty 30

## 2019-06-25 MED ORDER — HYDROMORPHONE HCL 1 MG/ML IJ SOLN
0.5000 mg | Freq: Once | INTRAMUSCULAR | Status: AC
  Administered 2019-06-25: 0.5 mg via INTRAVENOUS
  Filled 2019-06-25: qty 1

## 2019-06-25 MED ORDER — MIDAZOLAM HCL 2 MG/2ML IJ SOLN
INTRAMUSCULAR | Status: AC
  Filled 2019-06-25: qty 2

## 2019-06-25 MED ORDER — HYDROMORPHONE HCL 1 MG/ML IJ SOLN
1.5000 mg | Freq: Once | INTRAMUSCULAR | Status: AC
  Administered 2019-06-25: 1.5 mg via INTRAVENOUS
  Filled 2019-06-25: qty 2

## 2019-06-25 MED ORDER — FENTANYL CITRATE (PF) 100 MCG/2ML IJ SOLN
50.0000 ug | INTRAMUSCULAR | Status: AC | PRN
  Administered 2019-06-25 (×2): 50 ug via INTRAVENOUS
  Filled 2019-06-25 (×2): qty 2

## 2019-06-25 MED ORDER — SODIUM CHLORIDE 0.9 % IR SOLN
Status: DC | PRN
  Administered 2019-06-25 (×2): 1000 mL

## 2019-06-25 MED ORDER — DEXAMETHASONE SODIUM PHOSPHATE 10 MG/ML IJ SOLN
INTRAMUSCULAR | Status: AC
  Filled 2019-06-25: qty 2

## 2019-06-25 MED ORDER — FENTANYL CITRATE (PF) 100 MCG/2ML IJ SOLN
INTRAMUSCULAR | Status: DC | PRN
  Administered 2019-06-25 (×2): 50 ug via INTRAVENOUS
  Administered 2019-06-25: 100 ug via INTRAVENOUS
  Administered 2019-06-25: 50 ug via INTRAVENOUS

## 2019-06-25 MED ORDER — LACTATED RINGERS IV SOLN
INTRAVENOUS | Status: DC | PRN
  Administered 2019-06-25 (×2): via INTRAVENOUS

## 2019-06-25 MED ORDER — METHOCARBAMOL 500 MG PO TABS
500.0000 mg | ORAL_TABLET | Freq: Four times a day (QID) | ORAL | Status: DC | PRN
  Administered 2019-06-25: 500 mg via ORAL
  Filled 2019-06-25: qty 1

## 2019-06-25 MED ORDER — ONDANSETRON HCL 4 MG/2ML IJ SOLN
4.0000 mg | Freq: Four times a day (QID) | INTRAMUSCULAR | Status: DC | PRN
  Administered 2019-06-25: 4 mg via INTRAVENOUS
  Filled 2019-06-25: qty 2

## 2019-06-25 MED ORDER — ETONOGESTREL 68 MG ~~LOC~~ IMPL: 1.0000 | DRUG_IMPLANT | Freq: Once | SUBCUTANEOUS | Status: DC

## 2019-06-25 MED ORDER — KETOROLAC TROMETHAMINE 30 MG/ML IJ SOLN
INTRAMUSCULAR | Status: AC
  Filled 2019-06-25: qty 1

## 2019-06-25 MED ORDER — ONDANSETRON 4 MG PO TBDP: 4.0000 mg | ORAL_TABLET | Freq: Four times a day (QID) | ORAL | Status: DC | PRN

## 2019-06-25 MED ORDER — SCOPOLAMINE 1 MG/3DAYS TD PT72
MEDICATED_PATCH | TRANSDERMAL | Status: DC | PRN
  Administered 2019-06-25: 1 via TRANSDERMAL

## 2019-06-25 MED ORDER — SODIUM CHLORIDE 0.9% FLUSH
3.0000 mL | Freq: Once | INTRAVENOUS | Status: AC
  Administered 2019-06-25: 3 mL via INTRAVENOUS

## 2019-06-25 MED ORDER — ENOXAPARIN SODIUM 40 MG/0.4ML ~~LOC~~ SOLN: 40.0000 mg | SUBCUTANEOUS | Status: DC

## 2019-06-25 MED ORDER — ONDANSETRON HCL 4 MG/2ML IJ SOLN
INTRAMUSCULAR | Status: DC | PRN
  Administered 2019-06-25: 4 mg via INTRAVENOUS

## 2019-06-25 MED ORDER — HYDROMORPHONE HCL 1 MG/ML IJ SOLN
0.5000 mg | INTRAMUSCULAR | Status: DC | PRN
  Administered 2019-06-25: 2 mg via INTRAVENOUS
  Filled 2019-06-25: qty 2

## 2019-06-25 MED ORDER — DIPHENHYDRAMINE HCL 50 MG/ML IJ SOLN: 12.5000 mg | Freq: Four times a day (QID) | INTRAMUSCULAR | Status: DC | PRN

## 2019-06-25 MED ORDER — SODIUM CHLORIDE 0.9 % IV BOLUS (SEPSIS)
1000.0000 mL | Freq: Once | INTRAVENOUS | Status: AC
  Administered 2019-06-25: 03:00:00 1000 mL via INTRAVENOUS

## 2019-06-25 MED ORDER — SODIUM CHLORIDE 0.9 % IV SOLN
2.0000 g | INTRAVENOUS | Status: DC
  Administered 2019-06-25: 2 g via INTRAVENOUS
  Filled 2019-06-25: qty 2

## 2019-06-25 MED ORDER — SIMETHICONE 80 MG PO CHEW: 40.0000 mg | CHEWABLE_TABLET | Freq: Four times a day (QID) | ORAL | Status: DC | PRN

## 2019-06-25 MED ORDER — ONDANSETRON HCL 4 MG/2ML IJ SOLN
4.0000 mg | Freq: Once | INTRAMUSCULAR | Status: AC
  Administered 2019-06-25: 4 mg via INTRAVENOUS
  Filled 2019-06-25: qty 2

## 2019-06-25 MED ORDER — SCOPOLAMINE 1 MG/3DAYS TD PT72
MEDICATED_PATCH | TRANSDERMAL | Status: AC
  Filled 2019-06-25: qty 1

## 2019-06-25 MED ORDER — ESMOLOL HCL 100 MG/10ML IV SOLN
INTRAVENOUS | Status: AC
  Filled 2019-06-25: qty 10

## 2019-06-25 MED ORDER — ONDANSETRON HCL 4 MG/2ML IJ SOLN
INTRAMUSCULAR | Status: AC
  Filled 2019-06-25: qty 4

## 2019-06-25 MED ORDER — KCL IN DEXTROSE-NACL 20-5-0.45 MEQ/L-%-% IV SOLN
INTRAVENOUS | Status: DC
  Administered 2019-06-25: 05:00:00 via INTRAVENOUS
  Filled 2019-06-25: qty 1000

## 2019-06-25 MED ORDER — SUCCINYLCHOLINE CHLORIDE 200 MG/10ML IV SOSY
PREFILLED_SYRINGE | INTRAVENOUS | Status: AC
  Filled 2019-06-25: qty 10

## 2019-06-25 MED ORDER — LIDOCAINE 2% (20 MG/ML) 5 ML SYRINGE
INTRAMUSCULAR | Status: AC
  Filled 2019-06-25: qty 10

## 2019-06-25 MED ORDER — 0.9 % SODIUM CHLORIDE (POUR BTL) OPTIME
TOPICAL | Status: DC | PRN
  Administered 2019-06-25: 1000 mL

## 2019-06-25 MED ORDER — KETOROLAC TROMETHAMINE 30 MG/ML IJ SOLN
INTRAMUSCULAR | Status: DC | PRN
  Administered 2019-06-25: 30 mg via INTRAVENOUS

## 2019-06-25 MED ORDER — DOCUSATE SODIUM 100 MG PO CAPS: 100.0000 mg | ORAL_CAPSULE | Freq: Two times a day (BID) | ORAL | Status: DC

## 2019-06-25 MED ORDER — SUCCINYLCHOLINE CHLORIDE 20 MG/ML IJ SOLN
INTRAMUSCULAR | Status: DC | PRN
  Administered 2019-06-25: 100 mg via INTRAVENOUS

## 2019-06-25 MED ORDER — FENTANYL CITRATE (PF) 100 MCG/2ML IJ SOLN: 25.0000 ug | INTRAMUSCULAR | Status: DC | PRN

## 2019-06-25 MED ORDER — ROCURONIUM BROMIDE 10 MG/ML (PF) SYRINGE
PREFILLED_SYRINGE | INTRAVENOUS | Status: DC | PRN
  Administered 2019-06-25: 40 mg via INTRAVENOUS

## 2019-06-25 MED ORDER — PROMETHAZINE HCL 25 MG/ML IJ SOLN: 6.2500 mg | INTRAMUSCULAR | Status: DC | PRN

## 2019-06-25 SURGICAL SUPPLY — 47 items
ADH SKN CLS APL DERMABOND .7 (GAUZE/BANDAGES/DRESSINGS) ×1
APL PRP STRL LF DISP 70% ISPRP (MISCELLANEOUS) ×1
APPLIER CLIP 5 13 M/L LIGAMAX5 (MISCELLANEOUS) ×3
APR CLP MED LRG 5 ANG JAW (MISCELLANEOUS) ×1
BAG SPEC RTRVL 10 TROC 200 (ENDOMECHANICALS) ×1
BLADE CLIPPER SURG (BLADE) IMPLANT
CANISTER SUCT 3000ML PPV (MISCELLANEOUS) ×3 IMPLANT
CHLORAPREP W/TINT 26 (MISCELLANEOUS) ×3 IMPLANT
CLIP APPLIE 5 13 M/L LIGAMAX5 (MISCELLANEOUS) ×1 IMPLANT
CLOSURE WOUND 1/2 X4 (GAUZE/BANDAGES/DRESSINGS) ×1
COVER MAYO STAND STRL (DRAPES) IMPLANT
COVER SURGICAL LIGHT HANDLE (MISCELLANEOUS) ×3 IMPLANT
COVER WAND RF STERILE (DRAPES) ×1 IMPLANT
DERMABOND ADVANCED (GAUZE/BANDAGES/DRESSINGS) ×2
DERMABOND ADVANCED .7 DNX12 (GAUZE/BANDAGES/DRESSINGS) ×1 IMPLANT
DRAPE C-ARM 42X72 X-RAY (DRAPES) IMPLANT
ELECT REM PT RETURN 9FT ADLT (ELECTROSURGICAL) ×3
ELECTRODE REM PT RTRN 9FT ADLT (ELECTROSURGICAL) ×1 IMPLANT
ENDOLOOP SUT PDS II  0 18 (SUTURE) ×2
ENDOLOOP SUT PDS II 0 18 (SUTURE) IMPLANT
GLOVE BIO SURGEON STRL SZ7 (GLOVE) ×3 IMPLANT
GLOVE BIOGEL PI IND STRL 7.5 (GLOVE) ×1 IMPLANT
GLOVE BIOGEL PI INDICATOR 7.5 (GLOVE) ×6
GOWN STRL REUS W/ TWL LRG LVL3 (GOWN DISPOSABLE) ×3 IMPLANT
GOWN STRL REUS W/TWL LRG LVL3 (GOWN DISPOSABLE) ×9
GRASPER SUT TROCAR 14GX15 (MISCELLANEOUS) ×3 IMPLANT
KIT BASIN OR (CUSTOM PROCEDURE TRAY) ×3 IMPLANT
KIT TURNOVER KIT B (KITS) ×3 IMPLANT
NS IRRIG 1000ML POUR BTL (IV SOLUTION) ×3 IMPLANT
PAD ARMBOARD 7.5X6 YLW CONV (MISCELLANEOUS) ×5 IMPLANT
POUCH RETRIEVAL ECOSAC 10 (ENDOMECHANICALS) ×1 IMPLANT
POUCH RETRIEVAL ECOSAC 10MM (ENDOMECHANICALS) ×2
SCISSORS LAP 5X35 DISP (ENDOMECHANICALS) ×3 IMPLANT
SET CHOLANGIOGRAPH 5 50 .035 (SET/KITS/TRAYS/PACK) IMPLANT
SET IRRIG TUBING LAPAROSCOPIC (IRRIGATION / IRRIGATOR) ×3 IMPLANT
SET TUBE SMOKE EVAC HIGH FLOW (TUBING) ×3 IMPLANT
SLEEVE ENDOPATH XCEL 5M (ENDOMECHANICALS) ×6 IMPLANT
SPECIMEN JAR SMALL (MISCELLANEOUS) ×3 IMPLANT
STRIP CLOSURE SKIN 1/2X4 (GAUZE/BANDAGES/DRESSINGS) ×2 IMPLANT
SUT MNCRL AB 4-0 PS2 18 (SUTURE) ×3 IMPLANT
SUT VICRYL 0 UR6 27IN ABS (SUTURE) ×3 IMPLANT
TOWEL GREEN STERILE (TOWEL DISPOSABLE) ×3 IMPLANT
TOWEL GREEN STERILE FF (TOWEL DISPOSABLE) ×3 IMPLANT
TRAY LAPAROSCOPIC MC (CUSTOM PROCEDURE TRAY) ×3 IMPLANT
TROCAR XCEL BLUNT TIP 100MML (ENDOMECHANICALS) ×3 IMPLANT
TROCAR XCEL NON-BLD 5MMX100MML (ENDOMECHANICALS) ×3 IMPLANT
WATER STERILE IRR 1000ML POUR (IV SOLUTION) ×3 IMPLANT

## 2019-06-25 NOTE — Progress Notes (Signed)
Received patient from ED, VSS and grimacing in pain, oriented to room, bed control, call light and plan of care with the aid of video interpreter. Pre-op procedure done and administered PRN pain medication Fentanyl 50 mcg.  Will monitor.

## 2019-06-25 NOTE — Anesthesia Preprocedure Evaluation (Signed)
Anesthesia Evaluation  Patient identified by MRN, date of birth, ID band Patient awake    Reviewed: Allergy & Precautions, NPO status , Patient's Chart, lab work & pertinent test results  Airway Mallampati: II  TM Distance: >3 FB Neck ROM: Full    Dental  (+) Teeth Intact, Dental Advisory Given   Pulmonary neg pulmonary ROS,    Pulmonary exam normal breath sounds clear to auscultation       Cardiovascular negative cardio ROS Normal cardiovascular exam Rhythm:Regular Rate:Normal     Neuro/Psych negative neurological ROS     GI/Hepatic negative GI ROS, GERD  Medicated,CHOLECYSTITIS   Endo/Other  Obesity   Renal/GU negative Renal ROS     Musculoskeletal negative musculoskeletal ROS (+)   Abdominal   Peds  Hematology negative hematology ROS (+)   Anesthesia Other Findings Day of surgery medications reviewed with the patient.  Reproductive/Obstetrics                             Anesthesia Physical Anesthesia Plan  ASA: II and emergent  Anesthesia Plan: General   Post-op Pain Management:    Induction: Intravenous  PONV Risk Score and Plan: 4 or greater and Scopolamine patch - Pre-op, Midazolam, Dexamethasone, Ondansetron and Diphenhydramine  Airway Management Planned: Oral ETT  Additional Equipment:   Intra-op Plan:   Post-operative Plan: Extubation in OR  Informed Consent: I have reviewed the patients History and Physical, chart, labs and discussed the procedure including the risks, benefits and alternatives for the proposed anesthesia with the patient or authorized representative who has indicated his/her understanding and acceptance.     Dental advisory given  Plan Discussed with: CRNA  Anesthesia Plan Comments:         Anesthesia Quick Evaluation

## 2019-06-25 NOTE — ED Triage Notes (Signed)
Pt c/o epigastric and RUQ pain with NV.

## 2019-06-25 NOTE — Anesthesia Postprocedure Evaluation (Signed)
Anesthesia Post Note  Patient: Kimberly Avila  Procedure(s) Performed: LAPAROSCOPIC CHOLECYSTECTOMY (N/A Abdomen)     Patient location during evaluation: PACU Anesthesia Type: General Level of consciousness: awake and alert Pain management: pain level controlled Vital Signs Assessment: post-procedure vital signs reviewed and stable Respiratory status: spontaneous breathing, nonlabored ventilation, respiratory function stable and patient connected to nasal cannula oxygen Cardiovascular status: blood pressure returned to baseline and stable Postop Assessment: no apparent nausea or vomiting Anesthetic complications: no    Last Vitals:  Vitals:   06/25/19 0500 06/25/19 1100  BP: 105/76 113/61  Pulse: 74 80  Resp: 18   Temp: 36.5 C (!) 36.1 C  SpO2: 100% 98%    Last Pain:  Vitals:   06/25/19 0715  TempSrc:   PainSc: 5                  Catalina Gravel

## 2019-06-25 NOTE — Interval H&P Note (Signed)
History and Physical Interval Note:  06/25/2019 9:10 AM I have seen patient, reviewed data and examined, discussed lap chole with her. Will proceed today Kimberly Avila  has presented today for surgery, with the diagnosis of CHOLECYSTITIS.  The various methods of treatment have been discussed with the patient and family. After consideration of risks, benefits and other options for treatment, the patient has consented to  Procedure(s): LAPAROSCOPIC CHOLECYSTECTOMY WITH POSS. INTRAOPERATIVE CHOLANGIOGRAM (N/A) as a surgical intervention.  The patient's history has been reviewed, patient examined, no change in status, stable for surgery.  I have reviewed the patient's chart and labs.  Questions were answered to the patient's satisfaction.     Rolm Bookbinder

## 2019-06-25 NOTE — Anesthesia Procedure Notes (Signed)
Procedure Name: Intubation Date/Time: 06/25/2019 9:42 AM Performed by: Wilburn Cornelia, CRNA Pre-anesthesia Checklist: Patient identified, Emergency Drugs available, Suction available, Patient being monitored and Timeout performed Patient Re-evaluated:Patient Re-evaluated prior to induction Oxygen Delivery Method: Circle system utilized Preoxygenation: Pre-oxygenation with 100% oxygen Induction Type: IV induction and Rapid sequence Ventilation: Mask ventilation without difficulty Laryngoscope Size: Mac and 3 Grade View: Grade I Tube type: Oral Tube size: 7.0 mm Number of attempts: 1 Airway Equipment and Method: Stylet Placement Confirmation: ETT inserted through vocal cords under direct vision,  positive ETCO2,  CO2 detector and breath sounds checked- equal and bilateral Secured at: 21 cm Tube secured with: Tape Dental Injury: Teeth and Oropharynx as per pre-operative assessment

## 2019-06-25 NOTE — Op Note (Signed)
Preoperative diagnosis:acute cholecystitis Postoperative diagnosis:same as above Procedure: Laparoscopic cholecystectomy Surgeon: Dr. Serita Grammes Anesthesia: General Estimated blood loss: Minimal Drains:none Specimens: Gallbladder to pathology Complications: None Sponge and needle counts correct at completion Disposition to recovery stable condition  Indications: This is a78 yof with ruq pain, [place] with gallstone impacted. She has cholecystitis on exam and on [place].I discussed proceeding with lap chole today via the [employer] interpretery.  Procedure: After informed consent was obtained the patient was taken the operatingroom. She was givenantibiotics.SCDs were in place. She was placed under general anesthesia without complication. Herabdomen was prepped and draped in the standard sterile surgical fashion. A surgical timeout was then performed.  Iinfiltrated Marcaine below herumbilicus. I made a vertical incision. I grasped the fascia with a Kocherclamp. I incised the fascia and entered into the peritoneum bluntly. This was done without injury. I placed a 0 Vicryl pursestring suture and inserted a Hassan trocar. The abdomen was insufflatedto66mmHg pressure. I then inserted 3 further 5 mm trocars in the epigastrium and right side of the abdomen under direct vision without complication.The gallbladder had evidence of acute cholecystitis and was tense. I had to aspirate it just to be able to grasp it.I then retracted it cephalad and lateral. I was able to dissect in the triangle and obtain the critical view of safety.  I clipped the artery 3 times and divided it leaving 2 clips in place. I then treated the cystic duct in a similar fashion. The duct was inflamed and the clips traversed the duct. I did also place an endoloop around the stump.  The gallbladder was then removed from the liver bed without difficulty.It was placed in a retrieval bag. I removed the  gallbladder and a very large stone from the umbilcus. I did make this incision larger just to remove the stones. I then was able to obtain hemostasis.I irrigated and this was clear. I removed the Hasson trocar. I then tied down my pursestring suture. I placed an additional 0 Vicrylin theumbilical incision. I then removed the remaining trocars and desufflated the  abdomen. I then closed the incision with 4-0 Monocryl and glue.She tolerated this well was extubated and transferred to recovery stable

## 2019-06-25 NOTE — Transfer of Care (Signed)
Immediate Anesthesia Transfer of Care Note  Patient: Kimberly Avila  Procedure(s) Performed: LAPAROSCOPIC CHOLECYSTECTOMY (N/A Abdomen)  Patient Location: PACU  Anesthesia Type:General  Level of Consciousness: awake, alert  and oriented  Airway & Oxygen Therapy: Patient Spontanous Breathing and Patient connected to nasal cannula oxygen  Post-op Assessment: Report given to RN and Post -op Vital signs reviewed and stable  Post vital signs: Reviewed and stable  Last Vitals:  Vitals Value Taken Time  BP 113/61 06/25/19 1101  Temp 36.1 C 06/25/19 1100  Pulse 72 06/25/19 1108  Resp 11 06/25/19 1108  SpO2 100 % 06/25/19 1108  Vitals shown include unvalidated device data.  Last Pain:  Vitals:   06/25/19 0715  TempSrc:   PainSc: 5          Complications: No apparent anesthesia complications

## 2019-06-25 NOTE — Discharge Instructions (Signed)
[facility] LAPAROSCOPIC SURGERY: POST OP INSTRUCTIONS  Always review your discharge instruction sheet given to you by the facility where your surgery was performed. IF YOU HAVE DISABILITY OR FAMILY LEAVE FORMS, YOU MUST BRING THEM TO THE OFFICE FOR PROCESSING.   DO NOT GIVE THEM TO YOUR DOCTOR.  1. A prescription for pain medication may be given to you upon discharge.  Take your pain medication as prescribed, if needed.  If narcotic pain medicine is not needed, then you may take acetaminophen (Tylenol), naprosyn (Alleve), or ibuprofen (Advil) as needed. 2. Take your usually prescribed medications unless otherwise directed. 3. If you need a refill on your pain medication, please contact your pharmacy.  They will contact our office to request authorization. Prescriptions will not be filled after 5pm or on week-ends. 4. You should follow a light diet the first few days after arrival home, such as soup and crackers, etc.  Be sure to include lots of fluids daily. 5. Most patients will experience some swelling and bruising in the area of the incisions.  Ice packs will help.  Swelling and bruising can take several days to resolve.  6. It is common to experience some constipation if taking pain medication after surgery.  Increasing fluid intake and taking a stool softener (such as Colace) will usually help or prevent this problem from occurring.  A mild laxative (Milk of Magnesia or Miralax) should be taken according to package instructions if there are no bowel movements after 48 hours. 7. Unless discharge instructions indicate otherwise, you may remove your bandages 48 hours after surgery, and you may shower at that time.  You may have steri-strips (small skin tapes) in place directly over the incision.  These strips should be left on the skin for 7-10 days.  If your surgeon used skin glue on the incision, you may shower in 24 hours.  The glue will flake  off over the next 2-3 weeks.  Any sutures or staples will be removed at the office during your follow-up visit. 8. ACTIVITIES:  You may resume regular (light) daily activities beginning the next day--such as daily self-care, walking, climbing stairs--gradually increasing activities as tolerated.  You may have sexual intercourse when it is comfortable.  Refrain from any heavy lifting or straining until approved by your doctor. a. You may drive when you are no longer taking prescription pain medication, you can comfortably wear a seatbelt, and you can safely maneuver your car and apply brakes. b. RETURN TO WORK:  __________________________________________________________ 9. You should see your doctor in the office for a follow-up appointment approximately 2-3 weeks after your surgery.  Make sure that you call for this appointment within a day or two after you arrive home to insure a convenient appointment time. 10. OTHER INSTRUCTIONS: __________________________________________________________________________________________________________________________ __________________________________________________________________________________________________________________________ WHEN TO CALL YOUR DOCTOR: 1. Fever over 101.0 2. Inability to urinate 3. Continued bleeding from incision. 4. Increased pain, redness, or drainage from the incision. 5. Increasing abdominal pain  The clinic staff is available to answer your questions during regular business hours.  Please dont hesitate to call and ask to speak to one of the nurses for clinical concerns.  If you have a medical emergency, go to the nearest emergency room or call 911.  A surgeon from [facility] is always on call at the hospital. [address], [place], [place], [place]  [address] ? [address], [place], [place]   [address] [phone] ? [phone] ? [phone]  [phone] Web site:  [email or web address]     Managing Your Pain After Surgery Without Opioids    Thank you for participating in our program to help patients manage their pain after surgery without opioids. This is part of our effort to provide you with the best care possible, without exposing you or your family to the risk that opioids pose.  What pain can I expect after surgery? You can expect to have some pain after surgery. This is normal. The pain is typically worse the day after surgery, and quickly begins to get better. Many studies have found that many patients are able to manage their pain after surgery with Over-the-Counter (OTC) medications such as Tylenol and Motrin. If you have a condition that does not allow you to take Tylenol or Motrin, notify your surgical team.  How will I manage my pain? The best strategy for controlling your pain after surgery is around the clock pain control with Tylenol (acetaminophen) and Motrin (ibuprofen or Advil). Alternating these medications with each other allows you to maximize your pain control. In addition to Tylenol and Motrin, you can use heating pads or ice packs on your incisions to help reduce your pain.  How will I alternate your regular strength over-the-counter pain medication? You will take a dose of pain medication every three hours. ; Start by taking 650 mg of Tylenol (2 pills of 325 mg) ; 3 hours later take 600 mg of Motrin (3 pills of 200 mg) ; 3 hours after taking the Motrin take 650 mg of Tylenol ; 3 hours after that take 600 mg of Motrin.   - 1 -  See example - if your first dose of Tylenol is at 12:00 PM   12:00 PM Tylenol 650 mg (2 pills of 325 mg)  3:00 PM Motrin 600 mg (3 pills of 200 mg)  6:00 PM Tylenol 650 mg (2 pills of 325 mg)  9:00 PM Motrin 600 mg (3 pills of 200 mg)  Continue alternating every 3 hours   We recommend that you follow this schedule around-the-clock for at least 3 days after surgery, or until  you feel that it is no longer needed. Use the table on the last page of this handout to keep track of the medications you are taking. Important: Do not take more than 3000mg  of Tylenol or 3200mg  of Motrin in a 24-hour period. Do not take ibuprofen/Motrin if you have a history of bleeding stomach ulcers, severe kidney disease, &/or actively taking a blood thinner  What if I still have pain? If you have pain that is not controlled with the over-the-counter pain medications (Tylenol and Motrin or Advil) you might have what we call breakthrough pain. You will receive a prescription for a small amount of an opioid pain medication such as Oxycodone, Tramadol, or Tylenol with Codeine. Use these opioid pills in the first 24 hours after surgery if you have breakthrough pain. Do not take more than 1 pill every 4-6 hours.  If you still have uncontrolled pain after using all opioid pills, don't hesitate to call our staff using the number provided. We will help make sure you are managing your pain in the best way possible, and if necessary, we can provide a prescription for additional pain medication.   Day 1    Time  Name of Medication Number of pills taken  Amount of Acetaminophen  Pain Level   Comments  AM PM       AM PM  AM PM       AM PM       AM PM       AM PM       AM PM       AM PM       Total Daily amount of Acetaminophen Do not take more than  3,000 mg per day      Day 2    Time  Name of Medication Number of pills taken  Amount of Acetaminophen  Pain Level   Comments  AM PM       AM PM       AM PM       AM PM       AM PM       AM PM       AM PM       AM PM       Total Daily amount of Acetaminophen Do not take more than  3,000 mg per day      Day 3    Time  Name of Medication Number of pills taken  Amount of Acetaminophen  Pain Level   Comments  AM PM       AM PM       AM PM       AM PM          AM PM       AM PM       AM PM       AM PM        Total Daily amount of Acetaminophen Do not take more than  3,000 mg per day      Day 4    Time  Name of Medication Number of pills taken  Amount of Acetaminophen  Pain Level   Comments  AM PM       AM PM       AM PM       AM PM       AM PM       AM PM       AM PM       AM PM       Total Daily amount of Acetaminophen Do not take more than  3,000 mg per day      Day 5    Time  Name of Medication Number of pills taken  Amount of Acetaminophen  Pain Level   Comments  AM PM       AM PM       AM PM       AM PM       AM PM       AM PM       AM PM       AM PM       Total Daily amount of Acetaminophen Do not take more than  3,000 mg per day       Day 6    Time  Name of Medication Number of pills taken  Amount of Acetaminophen  Pain Level  Comments  AM PM       AM PM       AM PM       AM PM       AM PM       AM PM       AM PM       AM PM       Total Daily amount  of Acetaminophen Do not take more than  3,000 mg per day      Day 7    Time  Name of Medication Number of pills taken  Amount of Acetaminophen  Pain Level   Comments  AM PM       AM PM       AM PM       AM PM       AM PM       AM PM       AM PM       AM PM       Total Daily amount of Acetaminophen Do not take more than  3,000 mg per day        For additional information about how and where to safely dispose of unused opioid medications - [employer]  Disclaimer: This document contains information and/or instructional materials adapted from [facility] for the typical patient with your condition. It does not replace medical advice from your health care provider because your experience may differ from that of the typical patient. Talk to your health care provider if you have any questions about this document, your condition or your treatment plan. Adapted from [place] [facility]

## 2019-06-25 NOTE — Discharge Summary (Signed)
     Patient ID: Kimberly Avila [id number] 12-13-1981 38 y.o.  Admit date: 06/25/2019 Discharge date: 06/25/2019  Admitting Diagnosis: cholecystitis  Discharge Diagnosis Patient Active Problem List   Diagnosis Date Noted  . Acute calculous cholecystitis 06/25/2019  . Genital warts 12/31/2016  . Pregnancy 03/21/2014  . Gestational Diabetes Mellitus, class A2 03/16/2014  . Language barrier, speaks Spanish only 01/30/2014  . Desire for permanent sterilization 01/03/2014  . Group B streptococcus urinary tract infection complicating pregnancy 19/14/7829  . [place] multiparity in labor and delivery, antepartum 10/31/2013  . 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 Short interval between pregnancies complicating pregnancy, antepartum 10/10/2013  . Supervision of high-risk pregnancy 10/10/2013  . History of gestational diabetes in prior pregnancy, currently pregnant 10/10/2013  . DYSLIPIDEMIA 05/07/2010  . HEMORRHOIDS 08/22/2009    Consultants none  Reason for Admission: Pt is a 38 yo F who presented to the Ed with pain starting today this afternoon.  It was 1-2 hours after a meal.  She states that this is the worst pain she has had. She barely even recalls when she was here before and had a scan in 2015 showing a large gallstone.  She had an episode of n/v.  The pain is worse in the epigastric region.  She denies fever/jaundice/chills.  She is unaware of any family members with gallbladder disease.  She cannot get comfortable and all the medicine she has gotten in the ED has not relieved the pain.  She is not recently post partum.  She has not had child for 5 years.   Procedures Lap chole, Dr. Donne Hazel 06/25/2019  Hospital Course:  The patient was admitted and underwent a laparoscopic cholecystectomy.  The patient tolerated the procedure well.  On POD 0, the patient was tolerating a regular diet, voiding well, mobilizing, and pain was controlled with oral pain medications.   The patient was stable for DC home at this time with appropriate follow up made.   Physical Exam: Abd: soft, appropriately tender, incisions are c/d/i  Allergies as of 06/25/2019      Reactions   Gelatin Itching   Ibuprofen Rash   Nyquil [pseudoeph-doxylamine-dm-apap] Itching   Tylenol [acetaminophen] Itching      Medication List    TAKE these medications   etonogestrel 68 MG Impl implant Commonly known as: NEXPLANON 1 each by Subdermal route once. 3 year removal date is 05/26/2017   omeprazole 20 MG capsule Commonly known as: PRILOSEC Take 20 mg by mouth daily.   oxyCODONE 5 MG immediate release tablet Commonly known as: Oxy IR/ROXICODONE Take 1 tablet (5 mg total) by mouth every 4 (four) hours as needed for moderate pain.        Follow-up Information    [facility] up in 3 week(s).   Specialty: General Surgery Why: Our office will call you with appointment date and time.  Please bring photo ID with you to your appointment and an insurance card if you have this. Contact information: [place] [address] [phone]           Signed: Saverio Danker, [facility] 06/25/2019, 11:38 AM Pager: [phone]

## 2019-06-25 NOTE — ED Provider Notes (Signed)
Emergency Department Provider Note   I have reviewed the triage vital signs and the nursing notes.   HISTORY  Chief Complaint Abdominal Pain   HPI Kimberly Avila is a 38 y.o. female who presents to the emergency department with epigastric abdominal pain that radiates to RUQ for last few hours taht started a couple hours after her last meal. One episode of vomiting. Radiates to back as well. Crying and in exquisite pain on my exam.   No other associated or modifying symptoms.    Past Medical History:  Diagnosis Date  . Gestational diabetes    during pregnancy    Patient Active Problem List   Diagnosis Date Noted  . Genital warts 12/31/2016  . Pregnancy 03/21/2014  . Gestational Diabetes Mellitus, class A2 03/16/2014  . Language barrier, speaks Spanish only 01/30/2014  . Desire for permanent sterilization 01/03/2014  . Group B streptococcus urinary tract infection complicating pregnancy [id number]/8299  . [place] multiparity in labor and delivery, antepartum 10/31/2013  . 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 Short interval between pregnancies complicating pregnancy, antepartum 10/10/2013  . Supervision of high-risk pregnancy 10/10/2013  . History of gestational diabetes in prior pregnancy, currently pregnant 10/10/2013  . DYSLIPIDEMIA 05/07/2010  . HEMORRHOIDS 08/22/2009    Past Surgical History:  Procedure Laterality Date  . NO PAST SURGERIES      Current Outpatient Rx  . Order #: [id number] Class: Historical Med    Allergies Gelatin, Ibuprofen, Nyquil [pseudoeph-doxylamine-dm-apap], and Tylenol [acetaminophen]  Family History  Problem Relation Age of Onset  . Diabetes Maternal Uncle     Social History Social History   Tobacco Use  . Smoking status: Never Smoker  . Smokeless tobacco: Never Used  Substance Use Topics  . Alcohol use: No  . Drug use: No    Review of Systems  All other systems negative except as documented in the HPI. All  pertinent positives and negatives as reviewed in the HPI. ____________________________________________   PHYSICAL EXAM:  VITAL SIGNS: ED Triage Vitals  Enc Vitals Group     BP 06/25/19 0057 (!) 156/91     Pulse Rate 06/25/19 0057 83     Resp 06/25/19 0057 17     Temp 06/25/19 0057 99 F (37.2 C)     Temp Source 06/25/19 0057 Oral     SpO2 06/25/19 0057 100 %     Weight 06/25/19 0057 178 lb (80.7 kg)     Height 06/25/19 0057 5\' 3"  (1.6 m)     Head Circumference --      Peak Flow --      Pain Score 06/25/19 0101 10     Pain Loc --      Pain Edu? --      Excl. in [place]? --     Constitutional: Alert and oriented. Well appearing and in pain with crying. Eyes: Conjunctivae are normal. PERRL. EOMI. Head: Atraumatic. Nose: No congestion/rhinnorhea. Mouth/Throat: Mucous membranes are moist.  Oropharynx non-erythematous. Neck: No stridor.  No meningeal signs.   Cardiovascular: Normal rate, regular rhythm. Good peripheral circulation. Grossly normal heart sounds.   Respiratory: Normal respiratory effort.  No retractions. Lungs CTAB. Gastrointestinal: Soft and ttp to epigastric in RUQ. No distention.  Musculoskeletal: No lower extremity tenderness nor edema. No gross deformities of extremities. Neurologic:  Normal speech and language. No gross focal neurologic deficits are appreciated.  Skin:  Skin is warm, dry and intact. No rash noted.   ____________________________________________   Reva Bores (  all labs ordered are listed, but only abnormal results are displayed)  Labs Reviewed  COMPREHENSIVE METABOLIC PANEL - Abnormal; Notable for the following components:      Result Value   Potassium 3.3 (*)    CO2 21 (*)    Glucose, Bld 106 (*)    BUN 5 (*)    All other components within normal limits  CBC - Abnormal; Notable for the following components:   WBC 12.2 (*)    All other components within normal limits  URINALYSIS, ROUTINE W REFLEX MICROSCOPIC - Abnormal; Notable for the  following components:   Color, Urine STRAW (*)    Specific Gravity, Urine 1.004 (*)    All other components within normal limits  LIPASE, BLOOD  I-STAT BETA HCG BLOOD, ED ([facility], [facility], [facility] ONLY)   ____________________________________________  RADIOLOGY  [place] Abdomen Limited Ruq  Result Date: 06/25/2019 CLINICAL DATA:  Abdominal pain. EXAM: ULTRASOUND ABDOMEN LIMITED RIGHT UPPER QUADRANT COMPARISON:  Right upper quadrant ultrasound 08/20/2014 FINDINGS: Gallbladder: Physiologically distended. 2.1 cm shadowing stone in the fundus with mild intraluminal sludge. No gallbladder wall thickening or pericholecystic fluid. Positive sonographic Murphy sign noted by sonographer. Common bile duct: Diameter: 6 mm. Liver: Previous circumscribed hyperechoic lesion in the left lobe of the liver is not visualized currently. Borderline diffuse increase in parenchymal echogenicity. Portal vein is patent on color Doppler imaging with normal direction of blood flow towards the liver. IMPRESSION: Gallstone. Positive sonographic Murphy sign, a common indicator of acute cholecystitis, however no additional sonographic findings, and patient also had sonographic Murphy sign on exam 5 years prior, therefore significance is uncertain. Electronically Signed   By: Narda RutherfordMelanie  Sanford M.D.   On: 06/25/2019 03:09    ____________________________________________   PROCEDURES  Procedure(s) performed:   Procedures   ____________________________________________   INITIAL IMPRESSION / ASSESSMENT AND PLAN / ED COURSE  Cholecystitis versus pancreatitis.  Labs pending we will add on pain medicine, fluids and ultrasound.  2 doses of pain medicine did not really help we will give a third dose.  Discussed with surgery findings of the ultrasound and her labs.  They will see for recommendations.  Surgery to admit.   Pertinent labs & imaging results that were available during my care of the patient were reviewed by me and considered in my  medical decision making (see chart for details).   ____________________________________________  FINAL CLINICAL IMPRESSION(S) / ED DIAGNOSES  Final diagnoses:  Abdominal pain     MEDICATIONS GIVEN DURING THIS VISIT:  Medications  fentaNYL (SUBLIMAZE) injection 50 mcg (50 mcg Intravenous Given 06/25/19 0128)  sodium chloride flush (NS) 0.9 % injection 3 mL (3 mLs Intravenous Given 06/25/19 0128)  ondansetron (ZOFRAN) injection 4 mg (4 mg Intravenous Given 06/25/19 0128)  HYDROmorphone (DILAUDID) injection 0.5 mg (0.5 mg Intravenous Given 06/25/19 0236)  sodium chloride 0.9 % bolus 1,000 mL (0 mLs Intravenous Stopped 06/25/19 0326)  HYDROmorphone (DILAUDID) injection 0.5 mg (0.5 mg Intravenous Given 06/25/19 0332)     NEW OUTPATIENT MEDICATIONS STARTED DURING THIS VISIT:  New Prescriptions   No medications on file    Note:  This note was prepared with assistance of Dragon voice recognition software. Occasional wrong-word or sound-a-like substitutions may have occurred due to the inherent limitations of voice recognition software.   Cobie Marcoux, Barbara CowerJason, MD 06/25/19 [phone]

## 2019-06-25 NOTE — H&P (Signed)
Kimberly Avila is an 38 y.o. female.   Chief Complaint: abdominal pain HPI:  Pt is a 38 yo F who presented to the Ed with pain starting today this afternoon.  It was 1-2 hours after a meal.  She states that this is the worst pain she has had. She barely even recalls when she was here before and had a scan in 2015 showing a large gallstone.  She had an episode of n/v.  The pain is worse in the epigastric region.  She denies fever/jaundice/chills.  She is unaware of any family members with gallbladder disease.  She cannot get comfortable and all the medicine she has gotten in the ED has not relieved the pain.  She is not recently post partum.  She has not had child for 5 years.    Past Medical History:  Diagnosis Date  . Gestational diabetes    during pregnancy    Past Surgical History:  Procedure Laterality Date  . NO PAST SURGERIES      Family History  Problem Relation Age of Onset  . Diabetes Maternal Uncle    Social History:  reports that she has never smoked. She has never used smokeless tobacco. She reports that she does not drink alcohol or use drugs.  Allergies:  Allergies  Allergen Reactions  . Gelatin Itching  . Ibuprofen Rash  . Nyquil [Pseudoeph-Doxylamine-Dm-Apap] Itching  . Tylenol [Acetaminophen] Itching    Meds: nexplanon  Results for orders placed or performed during the hospital encounter of 06/25/19 (from the past 48 hour(s))  Urinalysis, Routine w reflex microscopic     Status: Abnormal   Collection Time: 06/25/19  1:00 AM  Result Value Ref Range   Color, Urine STRAW (A) YELLOW   APPearance CLEAR CLEAR   Specific Gravity, Urine 1.004 (L) 1.005 - 1.030   pH 8.0 5.0 - 8.0   Glucose, UA NEGATIVE NEGATIVE mg/dL   Hgb urine dipstick NEGATIVE NEGATIVE   Bilirubin Urine NEGATIVE NEGATIVE   Ketones, ur NEGATIVE NEGATIVE mg/dL   Protein, ur NEGATIVE NEGATIVE mg/dL   Nitrite NEGATIVE NEGATIVE   Leukocytes,Ua NEGATIVE NEGATIVE    Comment: Performed at  [facility], [address] [place], [place] [address]  Lipase, blood     Status: None   Collection Time: 06/25/19  1:14 AM  Result Value Ref Range   Lipase 28 11 - 51 U/L    Comment: Performed at [facility], [address] [place], [place] [address]  Comprehensive metabolic panel     Status: Abnormal   Collection Time: 06/25/19  1:14 AM  Result Value Ref Range   Sodium 135 135 - 145 mmol/L   Potassium 3.3 (L) 3.5 - 5.1 mmol/L   Chloride 104 98 - 111 mmol/L   CO2 21 (L) 22 - 32 mmol/L   Glucose, Bld 106 (H) 70 - 99 mg/dL   BUN 5 (L) 6 - 20 mg/dL   Creatinine, Ser 0.980.61 0.44 - 1.00 mg/dL   Calcium 9.1 8.9 - 11.910.3 mg/dL   Total Protein 7.2 6.5 - 8.1 g/dL   Albumin 4.0 3.5 - 5.0 g/dL   AST 27 15 - 41 U/L   ALT 28 0 - 44 U/L   Alkaline Phosphatase 64 38 - 126 U/L   Total Bilirubin 1.2 0.3 - 1.2 mg/dL   GFR calc non Af Amer >60 >60 mL/min   GFR calc Af Amer >60 >60 mL/min   Anion gap 10 5 -  15    Comment: Performed at [facility], [address] [place], [place] [address]  CBC     Status: Abnormal   Collection Time: 06/25/19  1:14 AM  Result Value Ref Range   WBC 12.2 (H) 4.0 - 10.5 K/uL   RBC 4.95 3.87 - 5.11 MIL/uL   Hemoglobin 14.5 12.0 - 15.0 g/dL   HCT 19.142.5 47.836.0 - 29.546.0 %   MCV 85.9 80.0 - 100.0 fL   MCH 29.3 26.0 - 34.0 pg   MCHC 34.1 30.0 - 36.0 g/dL   RDW 62.112.1 30.811.5 - 65.715.5 %   Platelets 259 150 - 400 K/uL   nRBC 0.0 0.0 - 0.2 %    Comment: Performed at [facility], [address] [place], [place] [address]  I-Stat beta hCG blood, ED     Status: None   Collection Time: 06/25/19  1:29 AM  Result Value Ref Range   I-stat hCG, quantitative <5.0 <5 mIU/mL   Comment 3            Comment:   GEST. AGE      CONC.  (mIU/mL)   <=1 WEEK        5 - 50     2 WEEKS       50 - 500     3 WEEKS       100 - 10,000     4 WEEKS     1,000 - 30,000        FEMALE AND NON-PREGNANT FEMALE:     LESS THAN 5 mIU/mL    [place] Abdomen Limited Ruq  Result Date:  06/25/2019 CLINICAL DATA:  Abdominal pain. EXAM: ULTRASOUND ABDOMEN LIMITED RIGHT UPPER QUADRANT COMPARISON:  Right upper quadrant ultrasound 08/20/2014 FINDINGS: Gallbladder: Physiologically distended. 2.1 cm shadowing stone in the fundus with mild intraluminal sludge. No gallbladder wall thickening or pericholecystic fluid. Positive sonographic Murphy sign noted by sonographer. Common bile duct: Diameter: 6 mm. Liver: Previous circumscribed hyperechoic lesion in the left lobe of the liver is not visualized currently. Borderline diffuse increase in parenchymal echogenicity. Portal vein is patent on color Doppler imaging with normal direction of blood flow towards the liver. IMPRESSION: Gallstone. Positive sonographic Murphy sign, a common indicator of acute cholecystitis, however no additional sonographic findings, and patient also had sonographic Murphy sign on exam 5 years prior, therefore significance is uncertain. Electronically Signed   By: Narda RutherfordMelanie  Sanford M.D.   On: 06/25/2019 03:09    Review of Systems  Constitutional: Negative.   HENT: Negative.   Eyes: Negative.   Respiratory: Negative.   Cardiovascular: Negative.   Gastrointestinal: Positive for abdominal pain, nausea and vomiting.  Genitourinary: Negative.   Musculoskeletal: Negative.   Skin: Negative.   Neurological: Negative.   Endo/Heme/Allergies: Negative.   Psychiatric/Behavioral: Negative.     Blood pressure 123/78, pulse 79, temperature 99 F (37.2 C), temperature source Oral, resp. rate 17, height 5\' 3"  (1.6 m), weight 80.7 kg, SpO2 99 %. Physical Exam  Constitutional: She is oriented to person, place, and time. She appears well-developed and well-nourished. She appears distressed.  Crying in pain  HENT:  Head: Normocephalic and atraumatic.  Right Ear: External ear normal.  Left Ear: External ear normal.  Eyes: Pupils are equal, round, and reactive to light. Conjunctivae are normal. Right eye exhibits no discharge. Left  eye exhibits no discharge. No scleral icterus.  Neck: Normal range of motion. Neck supple. No tracheal deviation present. No thyromegaly present.  Cardiovascular: Normal rate, regular rhythm and intact distal pulses.  Respiratory: Effort normal. No respiratory distress. She exhibits no tenderness.  GI: Soft. She exhibits no distension. There is abdominal tenderness (epigastrium). There is no rebound and no guarding.  Musculoskeletal: Normal range of motion.  Lymphadenopathy:    She has no cervical adenopathy.  Neurological: She is alert and oriented to person, place, and time. Coordination normal.  Skin: Skin is warm and dry. No rash noted. No erythema. No pallor.  Psychiatric: She has a normal mood and affect. Her behavior is normal. Judgment and thought content normal.     Assessment/Plan Acute calculous cholecystitis. hypokalemia  Will admit patient to floor. IV fluids Iv antibiotics Pain control Plan lap chole today or tomorrow as schedule allows. Briefly discussed surgery, but patient is significant distress and could not tolerate long discussion at this time.      Stark Klein, MD 06/25/2019, 3:50 AM

## 2019-06-26 ENCOUNTER — Encounter (HOSPITAL_COMMUNITY): Payer: Self-pay | Admitting: General Surgery

## 2019-06-30 ENCOUNTER — Emergency Department (HOSPITAL_COMMUNITY)
Admission: EM | Admit: 2019-06-30 | Discharge: 2019-07-01 | Disposition: A | Discharge status: Home / Self Care | Payer: Self-pay | Attending: Emergency Medicine | Admitting: Emergency Medicine

## 2019-06-30 ENCOUNTER — Other Ambulatory Visit: Payer: Self-pay

## 2019-06-30 ENCOUNTER — Encounter (HOSPITAL_COMMUNITY): Payer: Self-pay | Admitting: Emergency Medicine

## 2019-06-30 ENCOUNTER — Emergency Department (HOSPITAL_COMMUNITY): Payer: Self-pay

## 2019-06-30 DIAGNOSIS — Z9049 Acquired absence of other specified parts of digestive tract: Secondary | ICD-10-CM | POA: Insufficient documentation

## 2019-06-30 DIAGNOSIS — R1013 Epigastric pain: Secondary | ICD-10-CM | POA: Insufficient documentation

## 2019-06-30 DIAGNOSIS — G8918 Other acute postprocedural pain: Secondary | ICD-10-CM | POA: Insufficient documentation

## 2019-06-30 DIAGNOSIS — R109 Unspecified abdominal pain: Secondary | ICD-10-CM

## 2019-06-30 DIAGNOSIS — N12 Tubulo-interstitial nephritis, not specified as acute or chronic: Secondary | ICD-10-CM

## 2019-06-30 DIAGNOSIS — R1011 Right upper quadrant pain: Secondary | ICD-10-CM

## 2019-06-30 DIAGNOSIS — R112 Nausea with vomiting, unspecified: Secondary | ICD-10-CM | POA: Insufficient documentation

## 2019-06-30 DIAGNOSIS — I10 Essential (primary) hypertension: Secondary | ICD-10-CM | POA: Insufficient documentation

## 2019-06-30 DIAGNOSIS — Z79899 Other long term (current) drug therapy: Secondary | ICD-10-CM | POA: Insufficient documentation

## 2019-06-30 LAB — URINALYSIS, ROUTINE W REFLEX MICROSCOPIC
Bilirubin Urine: NEGATIVE
Glucose, UA: NEGATIVE mg/dL
Ketones, ur: NEGATIVE mg/dL
Nitrite: NEGATIVE
Protein, ur: 100 mg/dL — AB
Specific Gravity, Urine: 1.024 (ref 1.005–1.030)
pH: 6 (ref 5.0–8.0)

## 2019-06-30 LAB — COMPREHENSIVE METABOLIC PANEL
ALT: 70 U/L — ABNORMAL HIGH (ref 0–44)
AST: 93 U/L — ABNORMAL HIGH (ref 15–41)
Albumin: 3.9 g/dL (ref 3.5–5.0)
Alkaline Phosphatase: 92 U/L (ref 38–126)
Anion gap: 12 (ref 5–15)
BUN: 11 mg/dL (ref 6–20)
CO2: 23 mmol/L (ref 22–32)
Calcium: 9.4 mg/dL (ref 8.9–10.3)
Chloride: 101 mmol/L (ref 98–111)
Creatinine, Ser: 0.76 mg/dL (ref 0.44–1.00)
GFR calc Af Amer: >60 mL/min (ref >60)
GFR calc non Af Amer: >60 mL/min (ref >60)
Glucose, Bld: 141 mg/dL — ABNORMAL HIGH (ref 70–99)
Potassium: 3.8 mmol/L (ref 3.5–5.1)
Sodium: 136 mmol/L (ref 135–145)
Total Bilirubin: 1.4 mg/dL — ABNORMAL HIGH (ref 0.3–1.2)
Total Protein: 7.3 g/dL (ref 6.5–8.1)

## 2019-06-30 LAB — I-STAT BETA HCG BLOOD, ED (MC, WL, AP ONLY): I-stat hCG, quantitative: <5 m[IU]/mL (ref <5)

## 2019-06-30 LAB — CBC
HCT: 43.2 % (ref 36.0–46.0)
Hemoglobin: 14.6 g/dL (ref 12.0–15.0)
MCH: 29.3 pg (ref 26.0–34.0)
MCHC: 33.8 g/dL (ref 30.0–36.0)
MCV: 86.6 fL (ref 80.0–100.0)
Platelets: 274 10*3/uL (ref 150–400)
RBC: 4.99 MIL/uL (ref 3.87–5.11)
RDW: 12.2 % (ref 11.5–15.5)
WBC: 10 10*3/uL (ref 4.0–10.5)
nRBC: 0 % (ref 0.0–0.2)

## 2019-06-30 LAB — LIPASE, BLOOD: Lipase: 25 U/L (ref 11–51)

## 2019-06-30 MED ORDER — IOHEXOL 300 MG/ML  SOLN
100.0000 mL | Freq: Once | INTRAMUSCULAR | Status: AC | PRN
  Administered 2019-06-30: 100 mL via INTRAVENOUS

## 2019-06-30 MED ORDER — SODIUM CHLORIDE 0.9% FLUSH: 3.0000 mL | Freq: Once | INTRAVENOUS | Status: DC

## 2019-06-30 MED ORDER — ONDANSETRON HCL 4 MG/2ML IJ SOLN
4.0000 mg | Freq: Once | INTRAMUSCULAR | Status: AC
  Administered 2019-06-30: 4 mg via INTRAVENOUS
  Filled 2019-06-30: qty 2

## 2019-06-30 MED ORDER — HYDROMORPHONE HCL 1 MG/ML IJ SOLN
0.5000 mg | Freq: Once | INTRAMUSCULAR | Status: AC
  Administered 2019-06-30: 0.5 mg via INTRAVENOUS
  Filled 2019-06-30: qty 1

## 2019-06-30 MED ORDER — MORPHINE SULFATE (PF) 4 MG/ML IV SOLN
4.0000 mg | Freq: Once | INTRAVENOUS | Status: DC
  Filled 2019-06-30: qty 1

## 2019-06-30 MED ORDER — SODIUM CHLORIDE 0.9 % IV BOLUS
500.0000 mL | Freq: Once | INTRAVENOUS | Status: AC
  Administered 2019-06-30: 21:00:00 500 mL via INTRAVENOUS

## 2019-06-30 MED ORDER — SODIUM CHLORIDE 0.9 % IV SOLN
1.0000 g | Freq: Once | INTRAVENOUS | Status: AC
  Administered 2019-06-30: 1 g via INTRAVENOUS
  Filled 2019-06-30: qty 10

## 2019-06-30 NOTE — ED Notes (Signed)
Pt complaining of 10/10 epigastric pain

## 2019-06-30 NOTE — ED Provider Notes (Addendum)
[facility]   CSN: [id number] Arrival date & time: 06/30/19  1648    History   Chief Complaint Chief Complaint  Patient presents with   Abdominal Pain   Emesis   Post-op Problem    HPI Kimberly Avila is a 38 y.o. female with recent cholecystectomy on 06/25/2019 who presents with severe epigastric and right upper quadrant pain that began after eating a fried potato today.  She had one episode of vomiting at home.  She describes her pain as the same as prior to her cholecystectomy.  She denies any fevers, chest pain.  The pain takes her breath away.  The pain radiates to her back.  She is currently on her menstrual cycle, however she has not had it in years because of the Nexplanon implant.  This started yesterday.     HPI  Past Medical History:  Diagnosis Date   Gestational diabetes    during pregnancy    Patient Active Problem List   Diagnosis Date Noted   Acute calculous cholecystitis 06/25/2019   Genital warts 12/31/2016   Pregnancy 03/21/2014   Gestational Diabetes Mellitus, class A2 03/16/2014   Language barrier, speaks Spanish only 01/30/2014   Desire for permanent sterilization 01/03/2014   Group B streptococcus urinary tract infection complicating pregnancy 11/04/2013   Grand multiparity in labor and delivery, antepartum 10/31/2013   ASCUS with positive high risk HPV 10/14/2013   Short interval between pregnancies complicating pregnancy, antepartum 10/10/2013   Supervision of high-risk pregnancy 10/10/2013   History of gestational diabetes in prior pregnancy, currently pregnant 10/10/2013   DYSLIPIDEMIA 05/07/2010   HEMORRHOIDS 08/22/2009    Past Surgical History:  Procedure Laterality Date   CHOLECYSTECTOMY N/A 06/25/2019   Procedure: LAPAROSCOPIC CHOLECYSTECTOMY;  Surgeon: Emelia LoronWakefield, Matthew, MD;  [facility];  Service: General;  Laterality: N/A;   NO PAST SURGERIES       OB  History    Gravida  6   Para  6   Term  6   Preterm      AB      Living  6     SAB      TAB      Ectopic      Multiple      Live Births  6            Home Medications    Prior to Admission medications   Medication Sig Start Date End Date Taking? Authorizing Provider  etonogestrel (NEXPLANON) 68 MG IMPL implant 68 mg by Subdermal route once. 3 year removal date is 05/26/2017    Yes [provider]  omeprazole (PRILOSEC) 20 MG capsule Take 20 mg by mouth daily.   Yes [provider]  oxyCODONE (OXY IR/ROXICODONE) 5 MG immediate release tablet Take 1 tablet (5 mg total) by mouth every 4 (four) hours as needed for moderate pain. 06/25/19  Yes Emelia LoronWakefield, Matthew, MD    Family History Family History  Problem Relation Age of Onset   Diabetes Maternal Uncle     Social History Social History   Tobacco Use   Smoking status: Never Smoker   Smokeless tobacco: Never Used  Substance Use Topics   Alcohol use: No   Drug use: No     Allergies   Gelatin, Ibuprofen, Nyquil [pseudoeph-doxylamine-dm-apap], and Tylenol [acetaminophen]   Review of Systems Review of Systems  Constitutional: Negative for chills and fever.  HENT: Negative for facial swelling and sore throat.  Respiratory: Negative for shortness of breath.   Cardiovascular: Negative for chest pain.  Gastrointestinal: Positive for abdominal pain and vomiting. Negative for nausea.  Genitourinary: Positive for vaginal bleeding. Negative for dysuria.  Musculoskeletal: Positive for back pain.  Skin: Negative for rash and wound.  Neurological: Negative for headaches.  Psychiatric/Behavioral: The patient is not nervous/anxious.      Physical Exam Updated Vital Signs BP 114/71    Pulse 79    Temp 98.1 F (36.7 C) (Oral)    Resp 18    SpO2 99%   Physical Exam Vitals signs and nursing note reviewed.  Constitutional:      General: She is not in acute distress.    Appearance: She is  well-developed. She is not diaphoretic.  HENT:     Head: Normocephalic and atraumatic.     Mouth/Throat:     Pharynx: No oropharyngeal exudate.  Eyes:     General: No scleral icterus.       Right eye: No discharge.        Left eye: No discharge.     Conjunctiva/sclera: Conjunctivae normal.     Pupils: Pupils are equal, round, and reactive to light.  Neck:     Musculoskeletal: Normal range of motion and neck supple.     Thyroid: No thyromegaly.  Cardiovascular:     Rate and Rhythm: Normal rate and regular rhythm.     Heart sounds: Normal heart sounds. No murmur. No friction rub. No gallop.   Pulmonary:     Effort: Pulmonary effort is normal. No respiratory distress.     Breath sounds: Normal breath sounds. No stridor. No wheezing or rales.  Abdominal:     General: Bowel sounds are normal. There is no distension.     Palpations: Abdomen is soft.     Tenderness: There is abdominal tenderness in the right upper quadrant and epigastric area. There is right CVA tenderness and guarding. There is no rebound. Positive signs include Murphy's sign.     Comments: Surgical wounds well-appearing with no erythema or discharge, Steri-Strips in place  Lymphadenopathy:     Cervical: No cervical adenopathy.  Skin:    General: Skin is warm and dry.     Coloration: Skin is not pale.     Findings: No rash.  Neurological:     Mental Status: She is alert.     Coordination: Coordination normal.      ED Treatments / Results  Labs (all labs ordered are listed, but only abnormal results are displayed) Labs Reviewed  COMPREHENSIVE METABOLIC PANEL - Abnormal; Notable for the following components:      Result Value   Glucose, Bld 141 (*)    AST 93 (*)    ALT 70 (*)    Total Bilirubin 1.4 (*)    All other components within normal limits  URINALYSIS, ROUTINE W REFLEX MICROSCOPIC - Abnormal; Notable for the following components:   Color, Urine AMBER (*)    APPearance CLOUDY (*)    Hgb urine  dipstick LARGE (*)    Protein, ur 100 (*)    Leukocytes,Ua SMALL (*)    Bacteria, UA MANY (*)    All other components within normal limits  URINE CULTURE  LIPASE, BLOOD  CBC  I-STAT BETA HCG BLOOD, ED ([facility], [facility], [facility] ONLY)    EKG None  Radiology Ct Abdomen Pelvis W Contrast  Result Date: 06/30/2019 CLINICAL DATA:  History of recent cholecystectomy with epigastric pain and vomiting EXAM: CT ABDOMEN  AND PELVIS WITH CONTRAST TECHNIQUE: Multidetector CT imaging of the abdomen and pelvis was performed using the standard protocol following bolus administration of intravenous contrast. CONTRAST:  100mL OMNIPAQUE IOHEXOL 300 MG/ML  SOLN COMPARISON:  None. FINDINGS: Lower chest: Lung bases are free of acute infiltrate or sizable effusion. Hepatobiliary: Liver is well visualized and within normal limits. Changes of recent cholecystectomy are seen. Some minimal fluid is noted in the gallbladder fossa although no air is identified and no focal collection is seen to suggest biloma. These are likely normal postoperative changes given the short-term since the surgery. Pancreas: Unremarkable. No pancreatic ductal dilatation or surrounding inflammatory changes. Spleen: Normal in size without focal abnormality. Adrenals/Urinary Tract: Adrenal glands are within normal limits bilaterally. Kidneys are well visualized without renal calculi or obstructive changes. No ureteral stones are seen. The bladder is decompressed. Stomach/Bowel: The appendix is within normal limits. No obstructive or inflammatory changes of large or small bowel are seen. The stomach is within normal limits. Vascular/Lymphatic: No significant vascular findings are present. No enlarged abdominal or pelvic lymph nodes. Reproductive: Uterus and bilateral adnexa are unremarkable. Other: No abdominal wall hernia or abnormality. No abdominopelvic ascites. Musculoskeletal: No acute or significant osseous findings. IMPRESSION: Status post cholecystectomy. A  minimal amount of fluid is noted in the gallbladder fossa consistent with the recent surgery. No biloma or focal abscess is seen. No other focal abnormality is noted. Electronically Signed   By: Alcide CleverMark  Lukens M.D.   On: 06/30/2019 21:53    Procedures Procedures (including critical care time)  Medications Ordered in ED Medications  sodium chloride flush (NS) 0.9 % injection 3 mL (has no administration in time range)  LORazepam (ATIVAN) injection 0.5 mg (has no administration in time range)  ondansetron (ZOFRAN) injection 4 mg (4 mg Intravenous Given 06/30/19 2058)  sodium chloride 0.9 % bolus 500 mL (500 mLs Intravenous New Bag/Given 06/30/19 2103)  HYDROmorphone (DILAUDID) injection 0.5 mg (0.5 mg Intravenous Given 06/30/19 2058)  iohexol (OMNIPAQUE) 300 MG/ML solution 100 mL (100 mLs Intravenous Contrast Given 06/30/19 2125)  cefTRIAXone (ROCEPHIN) 1 g in sodium chloride 0.9 % 100 mL IVPB (0 g Intravenous Stopped 06/30/19 2342)  HYDROmorphone (DILAUDID) injection 0.5 mg (0.5 mg Intravenous Given 06/30/19 2306)     Initial Impression / Assessment and Plan / ED Course  I have reviewed the triage vital signs and the nursing notes.  Pertinent labs & imaging results that were available during my care of the patient were reviewed by me and considered in my medical decision making (see chart for details).  Clinical Course as of Jun 30 28  Thu Jun 30, 2019  [id number] 38 year old female who is status post lap cholecystectomy here with increased right upper quadrant pain that she rates as severe in nature.  She is continued to have pain despite pain medication and she has some slight elevations in her LFTs.  Discussed with general surgery who feels that there is nonoperative indication for her.  He did feel she may need to be admitted for pain control and there may need to be an exploration if she has a retained stone.   [MB]    Clinical Course User Index [MB] Terrilee FilesButler, Michael C, MD       Patient presenting  with right upper quadrant and epigastric pain status post cholecystectomy on 06/25/2019.  Patient's pain is severe and she is very tender.  CT abdomen pelvis shows small amount of fluid in the gallbladder fossa consistent with recent surgery, but  no other abnormality noted.  Patient has mild elevation of LFTs, AST 93, ALT 70, total bilirubin 1.4.  Patient's pain difficult to control, improving for a little bit after Dilaudid, but immediately returning.  I discussed patient case with patient's surgeon, who is on-call tonight, Dr. Donne Hazel, who advised MRCP.  MRCP ordered and patient will require Ativan for antianxiety.  If abnormal, follow-up with Dr. Donne Hazel and patient can be admitted and GI can be consulted in the morning.  Patient does have some back pain and tenderness over the right CVA.  UA shows bacteria, 11-20 WBCs.  It is a dirty sample, however if no other cause of pain is found, patient could be treated for pyelonephritis.  Rocephin given in the ED.  Will send urine culture.  We will at shift change, patient care transferred to Charlann Lange, PA-C, who is aware of plan. I discussed patient case with Dr. Melina Copa who guided the patient's management and agrees with plan.   Final Clinical Impressions(s) / ED Diagnoses   Final diagnoses:  Right upper quadrant abdominal pain  Postoperative pain    ED Discharge Orders    None           Frederica Kuster, PA-C 07/01/19 0030    Frederica Kuster, PA-C 07/01/19 0032    Hayden Rasmussen, MD 07/01/19 1008

## 2019-06-30 NOTE — Progress Notes (Signed)
Patient ID: Kimberly Avila, female   DOB: 03-14-1981, 38 y.o.   MRN: [id number] She is pod 5 from lap chole for acute cholecystitis.  Pain in ruq,epigastrium to back. No n/v.  Her abdomen is mildly tender with clean incisions.  Transaminases mildly elevated and bill 1.4 (was 1.2). Did not do cholangiogram due to friable duct (I put endoloop on it due to that).  Her ct scan shows just postop changes.  Agree with mrcp just to make sure no stone in duct. If that is negative and pain doesn't clear will admit and recheck lfts.  If mrcp positive for stone in duct will need admission and gi consult

## 2019-06-30 NOTE — ED Triage Notes (Signed)
Pt speaks spanish. Pt had her gallbladder removed on Saturday. States today she ate a meal and 10 minutes after she had sever generalized abdominal and back pain with vomiting. Pain was better after vomiting. Pain currently 4/10. Denies fevers or diarrhea. Pt did not take any medications for pain or nausea prior to arrival.

## 2019-06-30 NOTE — ED Provider Notes (Addendum)
Patient presented to previous treatment team for evaluation of recurrent RUQ and epigastric abdominal pain after cholecystectomy one week ago. She was pain free for 4 days before the pain returned after eating a fried meal. No fever, nausea, vomiting.   Dr. Donne Hazel was contacted who recommended MRCP to evaluate for ductal stone after recent cholecystectomy. If negative, she can be discharged home with office follow up.   MRCP done and initial read called to me by radiologist as negative for ductal dilatation or stone.   Re-evaluation of the patient finds her comfortable. No pain. Repeat exam elicits tenderness of the epigastric and RUQ abdomen. VSS.   She does have evidence of UTI, pyelonephritis considered. Rocephin provided here. Will discharge home on Keflex.   She can be discharged home per plan of previous treatment team.    Charlann Lange, PA-C 07/01/19 0713    Charlann Lange, PA-C 07/01/19 6578    Hayden Rasmussen, MD 07/01/19 [id number]

## 2019-06-30 NOTE — ED Notes (Signed)
Kimberly Avila (daughter) [phone] - speaks english

## 2019-06-30 NOTE — ED Notes (Signed)
Spoke with imaging about MRCP, explained that they would be ready for the pt around 0030

## 2019-06-30 NOTE — ED Notes (Signed)
Patient transported to CT 

## 2019-07-01 ENCOUNTER — Emergency Department (HOSPITAL_COMMUNITY): Payer: Self-pay

## 2019-07-01 LAB — URINE CULTURE

## 2019-07-01 MED ORDER — CEPHALEXIN 500 MG PO CAPS: 500.0000 mg | ORAL_CAPSULE | Freq: Four times a day (QID) | ORAL | 0 refills | Status: DC

## 2019-07-01 MED ORDER — OXYCODONE HCL 5 MG PO TABS: 5.0000 mg | ORAL_TABLET | ORAL | 0 refills | Status: DC | PRN

## 2019-07-01 MED ORDER — HYDROMORPHONE HCL 1 MG/ML IJ SOLN
0.5000 mg | Freq: Once | INTRAMUSCULAR | Status: DC
  Filled 2019-07-01: qty 1

## 2019-07-01 MED ORDER — LORAZEPAM 2 MG/ML IJ SOLN
0.5000 mg | Freq: Once | INTRAMUSCULAR | Status: AC
  Administered 2019-07-01: 0.5 mg via INTRAVENOUS
  Filled 2019-07-01: qty 1

## 2019-07-01 MED ORDER — GADOBUTROL 1 MMOL/ML IV SOLN
7.5000 mL | Freq: Once | INTRAVENOUS | Status: AC | PRN
  Administered 2019-07-01: 7.5 mL via INTRAVENOUS

## 2019-07-01 NOTE — Discharge Instructions (Addendum)
Follow up with your surgeon for recheck next week if pain continues.   You have an infection in your urine which may be contributing to symptoms of pain. Take the antibiotic prescribed until gone.   Return to the emergency department if you have any high fever, severe, pain or uncontrolled vomiting.

## 2019-07-01 NOTE — ED Notes (Signed)
Pt taken to MRCP 

## 2019-07-01 NOTE — ED Notes (Signed)
Patient verbalizes understanding of discharge instructions. Opportunity for questioning and answers were provided. Armband removed by staff, pt discharged from ED ambulatory with husband to transport pt home

## 2020-02-04 IMAGING — MR MR ABDOMEN WO/W CM MRCP
18 of 19 series · 46 of 48 positions shown · IV contrast (Gadavist)
Comparison: CT 06/30/2019.  Ultrasound 06/25/2019.

CLINICAL DATA: Cholecystectomy 6 days ago with epigastric and right
upper quadrant abdominal pain.

EXAM:
MRI ABDOMEN WITHOUT AND WITH CONTRAST (INCLUDING MRCP)
TECHNIQUE: Multiplanar multisequence MR imaging of the abdomen was performed
both before and after the administration of intravenous contrast.
Heavily T2-weighted images of the biliary and pancreatic ducts were
obtained, and three-dimensional MRCP images were rendered by post
processing.
CONTRAST:  7.5 cc Gadavist

[Series 4: bSSFP · coronal · 6.0mm · 0.74mm/px · 1 of 30 slices shown]
[im 1/30]
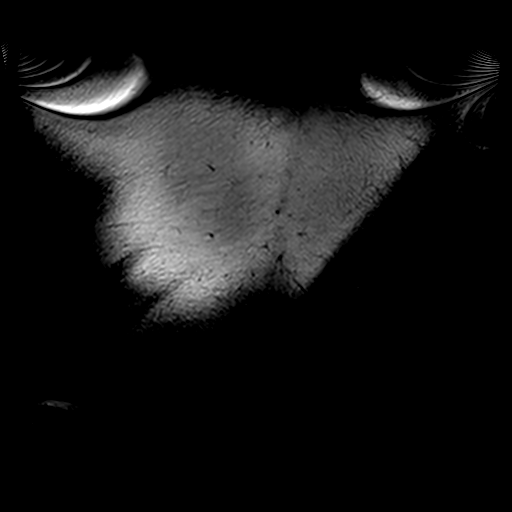

[Series 5: ax haste · axial · 6.0mm · 1.19mm/px · 1 of 30 slices shown]
[im 1/30]
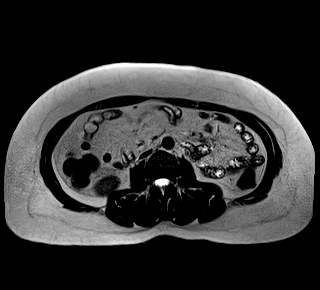

[Series 10: T2 fat-sat · axial · 6.0mm · 1.19mm/px · 1 of 30 slices shown]
[im 1/30]
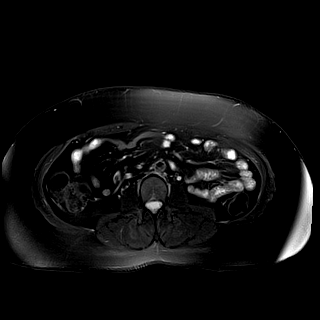

[Series 18: DWI · axial · 6.0mm · 1.42mm/px · z∈[-104,+104]mm · 4 of 90 slices shown (1 of 2)]
[im 1/90]
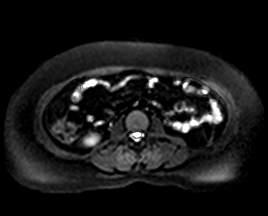
[im 30/90]
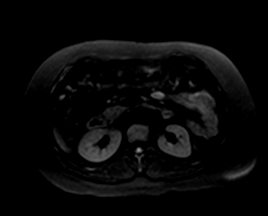
[im 60/90]
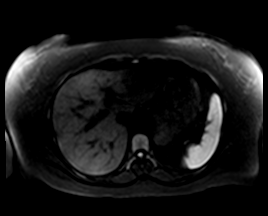
[im 90/90]
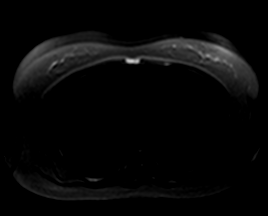

[Series 19: DWI · axial · 6.0mm · 1.42mm/px · 1 of 30 slices shown (2 of 2)]
[im 1/30]
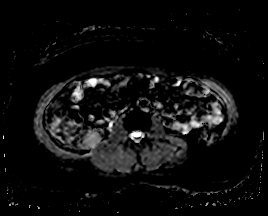

[Series 22: ax in and · axial · 3.0mm · 1.19mm/px · z∈[-117,+96]mm · 6 of 144 slices shown]
[im 1/144]
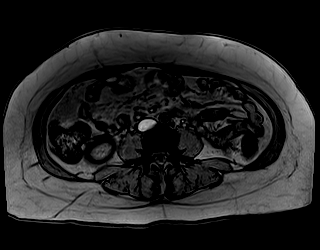
[im 29/144]
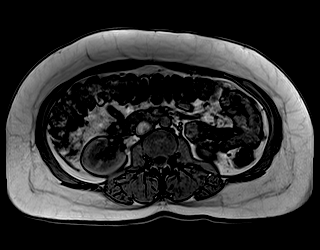
[im 58/144]
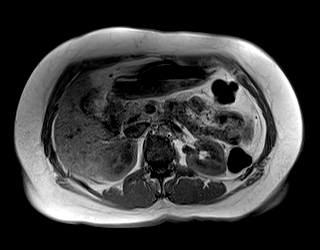
[im 86/144]
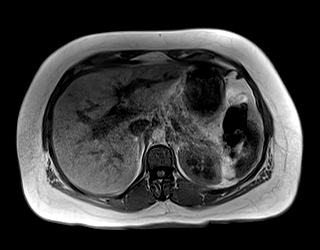
[im 115/144]
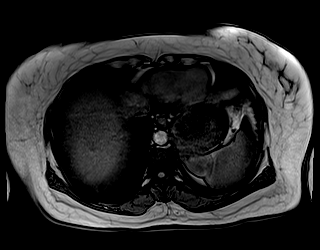
[im 144/144]
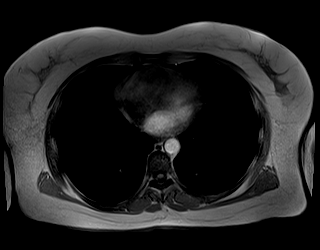

[Series 23: MRCP · coronal · 4.0mm · 1.12mm/px · 1 of 15 slices shown]
[im 1/15]
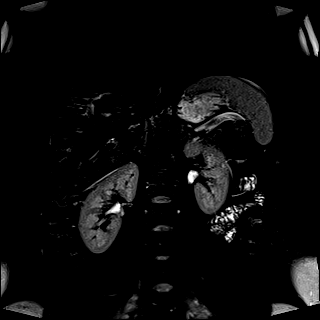

[Series 24: radials · coronal · 50.0mm · 0.78mm/px · 1 of 5 slices shown]
[im 1/5]
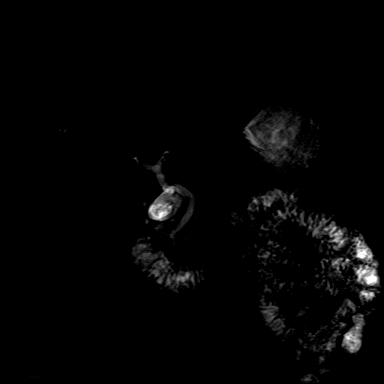

[Series 25: T1 dynamic · axial · non-contrast · 3.0mm · 1.19mm/px · z∈[-110,+103]mm · 3 of 72 slices shown (1 of 5)]
[im 1/72]
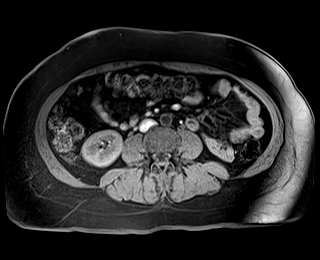
[im 36/72]
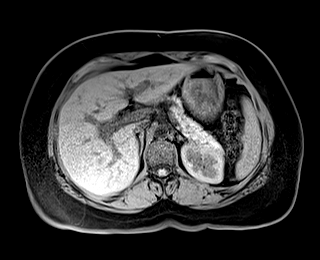
[im 72/72]
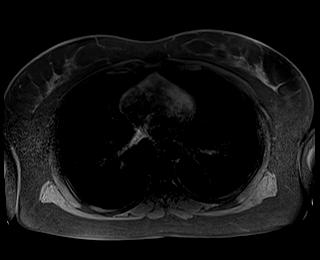

[Series 27: T1 dynamic post-contrast · axial · 3.0mm · 1.19mm/px · z∈[-110,+103]mm · 3 of 72 slices shown (1 of 5)]
[im 1/72]
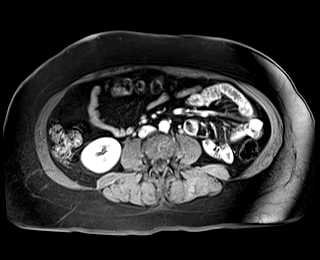
[im 36/72]
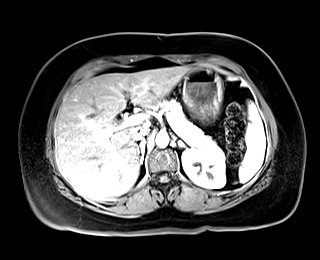
[im 72/72]
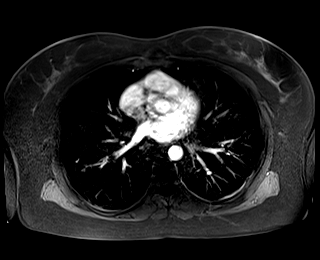

[Series 28: T1 dynamic · axial · 3.0mm · 1.19mm/px · z∈[-110,+103]mm · 3 of 72 slices shown (2 of 5)]
[im 1/72]
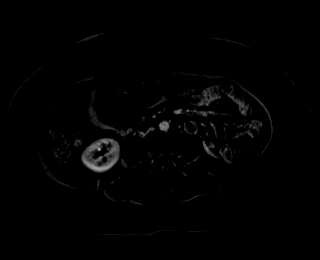
[im 36/72]
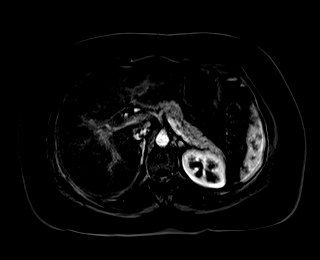
[im 72/72]
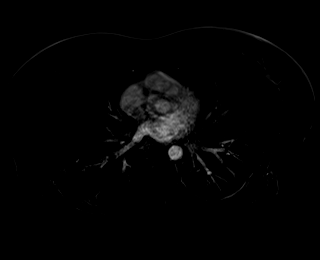

[Series 29: T1 dynamic post-contrast · axial · 3.0mm · 1.19mm/px · z∈[-110,+103]mm · 3 of 72 slices shown (2 of 5)]
[im 1/72]
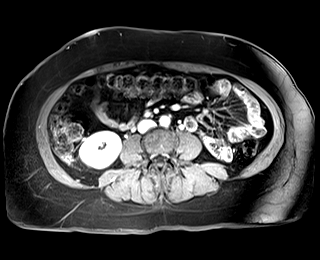
[im 36/72]
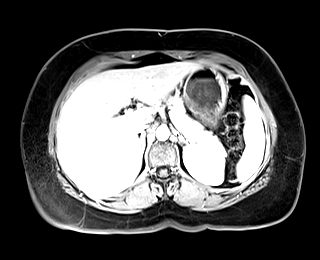
[im 72/72]
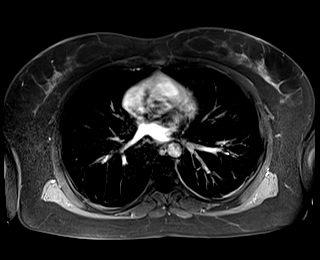

[Series 30: T1 dynamic · axial · 3.0mm · 1.19mm/px · z∈[-110,+103]mm · 3 of 72 slices shown (3 of 5)]
[im 1/72]
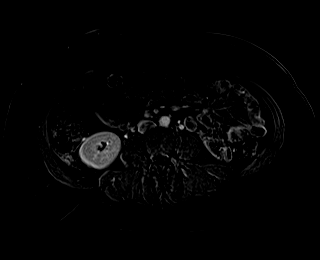
[im 36/72]
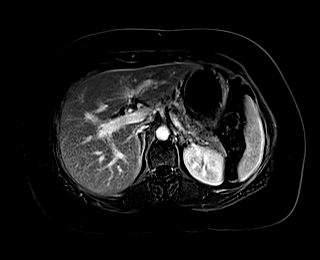
[im 72/72]
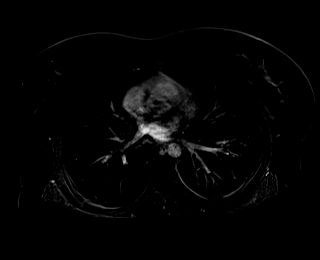

[Series 31: T1 dynamic post-contrast · axial · 3.0mm · 1.19mm/px · z∈[-110,+103]mm · 3 of 72 slices shown (3 of 5)]
[im 1/72]
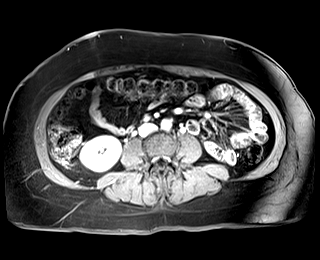
[im 36/72]
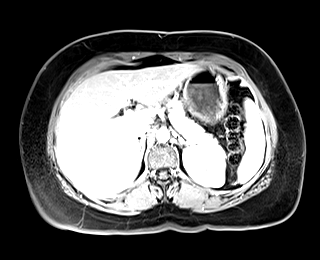
[im 72/72]
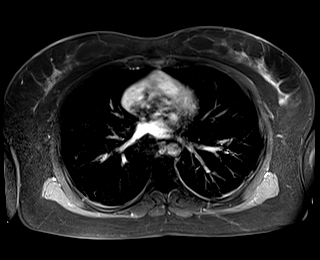

[Series 32: T1 dynamic · axial · 3.0mm · 1.19mm/px · z∈[-110,+103]mm · 3 of 72 slices shown (4 of 5)]
[im 1/72]
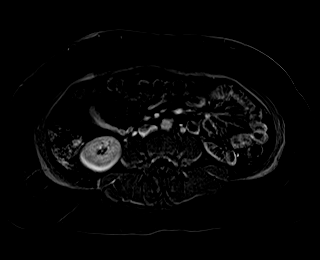
[im 36/72]
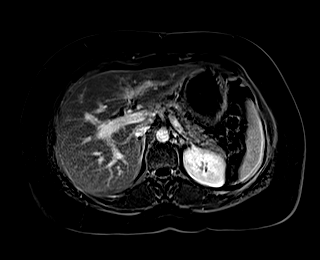
[im 72/72]
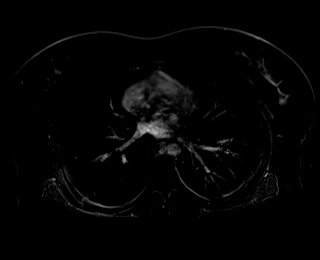

[Series 33: T1 dynamic post-contrast · axial · 3.0mm · 1.19mm/px · z∈[-110,+103]mm · 3 of 72 slices shown (4 of 5)]
[im 1/72]
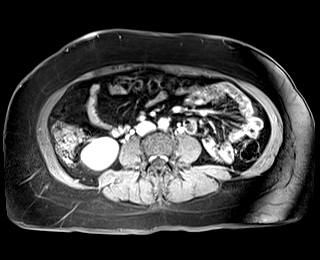
[im 36/72]
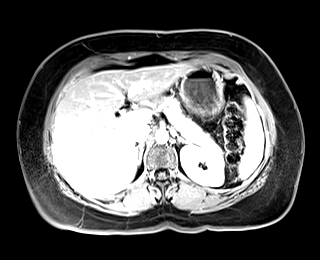
[im 72/72]
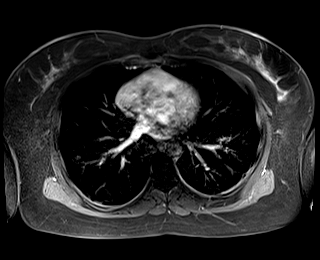

[Series 34: T1 dynamic · axial · 3.0mm · 1.19mm/px · z∈[-110,+103]mm · 3 of 72 slices shown (5 of 5)]
[im 1/72]
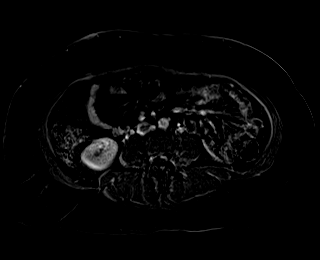
[im 36/72]
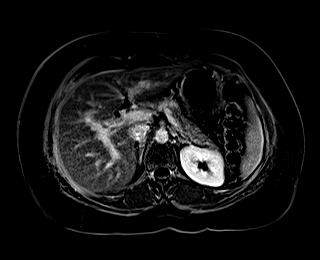
[im 72/72]
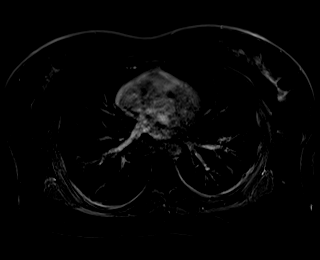

[Series 35: T1 dynamic post-contrast · coronal · 3.0mm · 1.31mm/px · 3 of 72 slices shown (5 of 5)]
[im 1/72]
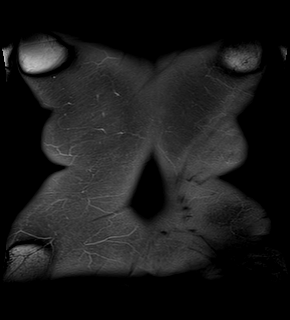
[im 36/72]
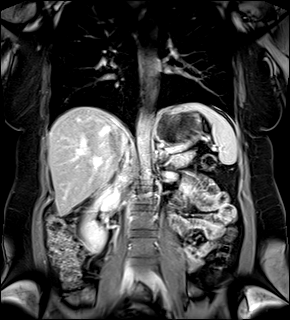
[im 72/72]
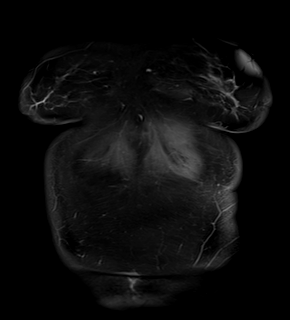

[46 of 48 positions shown; findings below may reference images not displayed]

FINDINGS: Lower chest:  The visualized lower chest appears unremarkable.

Hepatobiliary: The liver is normal in signal without focal
abnormality or abnormal enhancement. There is a small amount of
fluid in the cholecystectomy bed and Morison's pouch. The common
hepatic duct measures up to 8 mm in diameter. No evidence of
choledocholithiasis.

Pancreas: Unremarkable. No pancreatic ductal dilatation or
surrounding inflammatory changes.

Spleen: Normal in size without focal abnormality.

Adrenals/Urinary Tract: Both adrenal glands appear normal. The
kidneys and visualized ureters appear normal. No hydronephrosis.

Stomach/Bowel: No evidence of bowel wall thickening, distention or
surrounding inflammatory change.

Vascular/Lymphatic: There are no enlarged abdominal lymph nodes. No
significant vascular findings.

Other: No ascites, free air or focal extraluminal fluid collection.

Musculoskeletal: No acute or significant osseous findings.
IMPRESSION: 1. Minimal extrahepatic biliary prominence post cholecystectomy. No
evidence of choledocholithiasis.
2. Expected small amount of fluid in the cholecystectomy bed and
Morison's pouch. No focal fluid collection or postoperative
complications identified.

## 2020-11-18 ENCOUNTER — Emergency Department (HOSPITAL_COMMUNITY): Payer: No Typology Code available for payment source

## 2020-11-18 ENCOUNTER — Emergency Department (HOSPITAL_COMMUNITY)
Admission: EM | Admit: 2020-11-18 | Discharge: 2020-11-18 | Disposition: A | Discharge status: Home / Self Care | Payer: No Typology Code available for payment source | Attending: Emergency Medicine | Admitting: Emergency Medicine

## 2020-11-18 ENCOUNTER — Encounter (HOSPITAL_COMMUNITY): Payer: Self-pay | Admitting: Emergency Medicine

## 2020-11-18 ENCOUNTER — Other Ambulatory Visit: Payer: Self-pay

## 2020-11-18 DIAGNOSIS — M25569 Pain in unspecified knee: Secondary | ICD-10-CM

## 2020-11-18 DIAGNOSIS — M25561 Pain in right knee: Secondary | ICD-10-CM | POA: Insufficient documentation

## 2020-11-18 LAB — I-STAT BETA HCG BLOOD, ED (MC, WL, AP ONLY): I-stat hCG, quantitative: <5 m[IU]/mL (ref <5)

## 2020-11-18 MED ORDER — ENOXAPARIN SODIUM 80 MG/0.8ML ~~LOC~~ SOLN
75.0000 mg | Freq: Once | SUBCUTANEOUS | Status: AC
  Administered 2020-11-18: 75 mg via SUBCUTANEOUS
  Filled 2020-11-18: qty 0.75

## 2020-11-18 NOTE — ED Provider Notes (Signed)
[place] EMERGENCY DEPARTMENT Provider Note  CSN: [id number] Arrival date & time: 11/18/20 1642    History Chief Complaint  Patient presents with  . Knee Pain    HPI History provided through video Spanish Interpreter Kimberly Avila is a 39 y.o. female with no significant PMH reports 3 days of burning pain in R knee and just below that area, worse with movement. No known injury. She is allergic to all OTC pain medications so she has not been taking anything for the pain. No CP, SOB or fevers.    Past Medical History:  Diagnosis Date  . Gestational diabetes    during pregnancy    Past Surgical History:  Procedure Laterality Date  . CHOLECYSTECTOMY N/A 06/25/2019   Procedure: LAPAROSCOPIC CHOLECYSTECTOMY;  Surgeon: Emelia Loron, MD;  Location[facility] OR;  Service: General;  Laterality: N/A;  . NO PAST SURGERIES      Family History  Problem Relation Age of Onset  . Diabetes Maternal Uncle     Social History   Tobacco Use  . Smoking status: Never Smoker  . Smokeless tobacco: Never Used  Substance Use Topics  . Alcohol use: No  . Drug use: No     Home Medications Prior to Admission medications   Medication Sig Start Date End Date Taking? Authorizing Provider  cephALEXin (KEFLEX) 500 MG capsule Take 1 capsule (500 mg total) by mouth 4 (four) times daily. 07/01/19   Elpidio Anis, PA-C  etonogestrel (NEXPLANON) 68 MG IMPL implant 68 mg by Subdermal route once. 3 year removal date is 05/26/2017     [provider]  omeprazole (PRILOSEC) 20 MG capsule Take 20 mg by mouth daily.    [provider]  oxyCODONE (OXY IR/ROXICODONE) 5 MG immediate release tablet Take 1 tablet (5 mg total) by mouth every 4 (four) hours as needed for moderate pain. 07/01/19   Elpidio Anis, PA-C     Allergies    Gelatin, Ibuprofen, Nyquil [pseudoeph-doxylamine-dm-apap], and Tylenol [acetaminophen]   Review of Systems   Review of Systems A comprehensive review  of systems was completed and negative except as noted in HPI.    Physical Exam BP 120/81 (BP Location: Left Arm)   Pulse 66   Temp 98.2 F (36.8 C) (Oral)   Resp 16   LMP 10/20/2020   SpO2 100%   Physical Exam Vitals and nursing note reviewed.  HENT:     Head: Normocephalic.     Nose: Nose normal.  Eyes:     Extraocular Movements: Extraocular movements intact.  Pulmonary:     Effort: Pulmonary effort is normal.  Musculoskeletal:        General: Swelling and tenderness (R medial knee and proximal anterior leg, some soreness to R calf and R popliteal fossa) present. Normal range of motion.     Cervical back: Neck supple.  Skin:    Findings: No rash (on exposed skin).  Neurological:     Mental Status: She is alert and oriented to person, place, and time.  Psychiatric:        Mood and Affect: Mood normal.      ED Results / Procedures / Treatments   Labs (all labs ordered are listed, but only abnormal results are displayed) Labs Reviewed  I-STAT BETA HCG BLOOD, ED ([facility], [facility], [facility] ONLY)    EKG None  Radiology DG Knee Complete 4 Views Right  Result Date: 11/18/2020 CLINICAL DATA:  Bruising noted.  Knee pain EXAM: RIGHT KNEE -  COMPLETE 4+ VIEW COMPARISON:  None. FINDINGS: No evidence of fracture, dislocation, or joint effusion. No evidence of severe arthropathy or other focal bone abnormality. Soft tissues are unremarkable. IMPRESSION: Negative. Electronically Signed   By: Tish Frederickson M.D.   On: 11/18/2020 18:20    Procedures Procedures  Medications Ordered in the ED Medications  enoxaparin (LOVENOX) 100 mg/mL injection 1 mg/kg (has no administration in time range)     MDM Rules/Calculators/A&P MDM Patient's xray is normal. Exam with some signs of DVT but not significantly swelling and no tenderness over proximal deep veins. Vitals normal. Plan ACE wrap for comfort. She cannot take APAP or Motrin per her report. Plan Lovenox and return in AM for Doppler.  ED  Course  I have reviewed the triage vital signs and the nursing notes.  Pertinent labs & imaging results that were available during my care of the patient were reviewed by me and considered in my medical decision making (see chart for details).     Final Clinical Impression(s) / ED Diagnoses Final diagnoses:  Knee pain  Acute pain of right knee    Rx / DC Orders ED Discharge Orders         Ordered    LE VENOUS        11/18/20 1946           Pollyann Savoy, MD 11/18/20 1946

## 2020-11-18 NOTE — ED Triage Notes (Signed)
Pt presents to ED POV. Pt c/o R knee pain. Pain is 10/10 burning pain, worsens with movement. Pt ambulating independently.

## 2020-11-18 NOTE — Discharge Instructions (Signed)
IMPORTANT PATIENT INSTRUCTIONS:  You have been scheduled for an Outpatient Vascular Study at [facility].    If tomorrow is a Saturday, Sunday or holiday, please go to the [facility] Registration Desk at 11 am tomorrow morning and tell them you are there for a vascular study.   If tomorrow is a weekday (Monday-Friday), please go to [facility] Registration at 11 am and tell them you are there for a vascular study. 

## 2020-11-18 NOTE — ED Notes (Signed)
Patient verbalizes understanding of discharge instructions. Opportunity for questioning and answers were provided. Armband removed by staff, pt discharged from ED ambulatory.   

## 2020-11-19 ENCOUNTER — Other Ambulatory Visit (HOSPITAL_COMMUNITY): Payer: Self-pay | Admitting: Emergency Medicine

## 2020-11-19 ENCOUNTER — Ambulatory Visit (HOSPITAL_COMMUNITY): Admission: RE | Admit: 2020-11-19 | Payer: No Typology Code available for payment source | Source: Ambulatory Visit

## 2020-11-19 DIAGNOSIS — M7989 Other specified soft tissue disorders: Secondary | ICD-10-CM

## 2022-01-22 ENCOUNTER — Emergency Department (HOSPITAL_COMMUNITY): Payer: No Typology Code available for payment source

## 2022-01-22 ENCOUNTER — Encounter (HOSPITAL_COMMUNITY): Payer: Self-pay | Admitting: Emergency Medicine

## 2022-01-22 ENCOUNTER — Emergency Department (HOSPITAL_COMMUNITY)
Admission: EM | Admit: 2022-01-22 | Discharge: 2022-01-22 | Disposition: A | Discharge status: Home / Self Care | Payer: No Typology Code available for payment source | Attending: Emergency Medicine | Admitting: Emergency Medicine

## 2022-01-22 DIAGNOSIS — N939 Abnormal uterine and vaginal bleeding, unspecified: Secondary | ICD-10-CM

## 2022-01-22 DIAGNOSIS — N9489 Other specified conditions associated with female genital organs and menstrual cycle: Secondary | ICD-10-CM | POA: Insufficient documentation

## 2022-01-22 DIAGNOSIS — N12 Tubulo-interstitial nephritis, not specified as acute or chronic: Secondary | ICD-10-CM | POA: Insufficient documentation

## 2022-01-22 DIAGNOSIS — E876 Hypokalemia: Secondary | ICD-10-CM | POA: Insufficient documentation

## 2022-01-22 DIAGNOSIS — N938 Other specified abnormal uterine and vaginal bleeding: Secondary | ICD-10-CM | POA: Insufficient documentation

## 2022-01-22 LAB — URINALYSIS, MICROSCOPIC (REFLEX): RBC / HPF: >50 RBC/hpf (ref 0–5)

## 2022-01-22 LAB — CBC WITH DIFFERENTIAL/PLATELET
Abs Immature Granulocytes: 0.03 10*3/uL (ref 0.00–0.07)
Basophils Absolute: 0.1 10*3/uL (ref 0.0–0.1)
Basophils Relative: 1 %
Eosinophils Absolute: 0.2 10*3/uL (ref 0.0–0.5)
Eosinophils Relative: 3 %
HCT: 41.8 % (ref 36.0–46.0)
Hemoglobin: 14.1 g/dL (ref 12.0–15.0)
Immature Granulocytes: 0 %
Lymphocytes Relative: 32 %
Lymphs Abs: 2.6 10*3/uL (ref 0.7–4.0)
MCH: 29.2 pg (ref 26.0–34.0)
MCHC: 33.7 g/dL (ref 30.0–36.0)
MCV: 86.5 fL (ref 80.0–100.0)
Monocytes Absolute: 0.6 10*3/uL (ref 0.1–1.0)
Monocytes Relative: 7 %
Neutro Abs: 4.7 10*3/uL (ref 1.7–7.7)
Neutrophils Relative %: 57 %
Platelets: 274 10*3/uL (ref 150–400)
RBC: 4.83 MIL/uL (ref 3.87–5.11)
RDW: 12.6 % (ref 11.5–15.5)
WBC: 8.3 10*3/uL (ref 4.0–10.5)
nRBC: 0 % (ref 0.0–0.2)

## 2022-01-22 LAB — URINALYSIS, ROUTINE W REFLEX MICROSCOPIC
Bilirubin Urine: NEGATIVE
Glucose, UA: NEGATIVE mg/dL
Ketones, ur: NEGATIVE mg/dL
Nitrite: NEGATIVE
Protein, ur: NEGATIVE mg/dL
Specific Gravity, Urine: 1.025 (ref 1.005–1.030)
pH: 5.5 (ref 5.0–8.0)

## 2022-01-22 LAB — COMPREHENSIVE METABOLIC PANEL
ALT: 14 U/L (ref 0–44)
AST: 19 U/L (ref 15–41)
Albumin: 3.9 g/dL (ref 3.5–5.0)
Alkaline Phosphatase: 53 U/L (ref 38–126)
Anion gap: 9 (ref 5–15)
BUN: 10 mg/dL (ref 6–20)
CO2: 25 mmol/L (ref 22–32)
Calcium: 9.2 mg/dL (ref 8.9–10.3)
Chloride: 105 mmol/L (ref 98–111)
Creatinine, Ser: 0.7 mg/dL (ref 0.44–1.00)
GFR, Estimated: >60 mL/min (ref >60)
Glucose, Bld: 127 mg/dL — ABNORMAL HIGH (ref 70–99)
Potassium: 3.4 mmol/L — ABNORMAL LOW (ref 3.5–5.1)
Sodium: 139 mmol/L (ref 135–145)
Total Bilirubin: 1.3 mg/dL — ABNORMAL HIGH (ref 0.3–1.2)
Total Protein: 7.2 g/dL (ref 6.5–8.1)

## 2022-01-22 LAB — I-STAT BETA HCG BLOOD, ED (MC, WL, AP ONLY): I-stat hCG, quantitative: <5 m[IU]/mL (ref <5)

## 2022-01-22 MED ORDER — CEFPODOXIME PROXETIL 200 MG PO TABS: 200.0000 mg | ORAL_TABLET | Freq: Two times a day (BID) | ORAL | 0 refills | Status: DC

## 2022-01-22 MED ORDER — IOHEXOL 300 MG/ML  SOLN
100.0000 mL | Freq: Once | INTRAMUSCULAR | Status: AC | PRN
  Administered 2022-01-22: 100 mL via INTRAVENOUS

## 2022-01-22 NOTE — ED Provider Notes (Signed)
[facility] EMERGENCY DEPARTMENT Provider Note   CSN: [id number] Arrival date & time: 01/22/22  1117     History  Chief Complaint  Patient presents with   Vaginal Bleeding    Kimberly Avila is a 41 y.o. female with history of diabetes, cholecystectomy.  Patient states that she began having bilateral side pain on the 12 January.  Patient states that she then began having her period on January 16 through January 21.  Patient states during this time of her period her pain went away.  Patient states that she had some itchiness to her vagina with a period but this resolved itself.  Patient states that she began bleeding again on the 23rd and has been bleeding since this time.  Patient states that she is going through 1 pad every 6 hours.  Patient denies fever, nausea, vomiting, vaginal discharge, painful urination.  The history is limited by a language barrier. A language interpreter was used.  Vaginal Bleeding Associated symptoms: no abdominal pain, no dysuria, no fever, no nausea and no vaginal discharge       Home Medications Prior to Admission medications   Medication Sig Start Date End Date Taking? Authorizing Provider  cefpodoxime (VANTIN) 200 MG tablet Take 1 tablet (200 mg total) by mouth 2 (two) times daily. 01/22/22  Yes Azucena Cecil, PA-C  cephALEXin (KEFLEX) 500 MG capsule Take 1 capsule (500 mg total) by mouth 4 (four) times daily. 07/01/19   Charlann Lange, PA-C  etonogestrel (NEXPLANON) 68 MG IMPL implant 68 mg by Subdermal route once. 3 year removal date is 05/26/2017     [provider]  omeprazole (PRILOSEC) 20 MG capsule Take 20 mg by mouth daily.    [provider]  oxyCODONE (OXY IR/ROXICODONE) 5 MG immediate release tablet Take 1 tablet (5 mg total) by mouth every 4 (four) hours as needed for moderate pain. 07/01/19   Charlann Lange, PA-C      Allergies    Gelatin, Ibuprofen, Nyquil [pseudoeph-doxylamine-dm-apap], and  Tylenol [acetaminophen]    Review of Systems   Review of Systems  Constitutional:  Negative for chills and fever.  Gastrointestinal:  Negative for abdominal pain, diarrhea, nausea and vomiting.  Genitourinary:  Positive for flank pain and vaginal bleeding. Negative for dysuria, hematuria and vaginal discharge.  All other systems reviewed and are negative.  Physical Exam Updated Vital Signs BP 117/73    Pulse 62    Temp 97.9 F (36.6 C) (Oral)    Resp 15    SpO2 100%  Physical Exam Vitals and nursing note reviewed.  Constitutional:      General: She is not in acute distress.    Appearance: She is not ill-appearing, toxic-appearing or diaphoretic.  HENT:     Head: Normocephalic and atraumatic.     Nose: Nose normal.     Mouth/Throat:     Mouth: Mucous membranes are moist.  Eyes:     Extraocular Movements: Extraocular movements intact.     Pupils: Pupils are equal, round, and reactive to light.  Cardiovascular:     Rate and Rhythm: Normal rate and regular rhythm.     Pulses: Normal pulses.     Heart sounds: Normal heart sounds.  Pulmonary:     Effort: Pulmonary effort is normal. No respiratory distress.     Breath sounds: Rhonchi present. No wheezing or rales.  Abdominal:     General: Abdomen is flat.     Palpations: Abdomen is soft.  Tenderness: There is no abdominal tenderness. There is right CVA tenderness and left CVA tenderness. There is no guarding.  Genitourinary:    General: Normal vulva.     Vagina: No vaginal discharge.     Comments: There is slight bleeding noted to the vaginal vault.  No active hemorrhaging. Musculoskeletal:     Cervical back: Normal range of motion and neck supple. No tenderness.  Skin:    General: Skin is warm and dry.     Capillary Refill: Capillary refill takes less than 2 seconds.  Neurological:     General: No focal deficit present.     Mental Status: She is alert.    ED Results / Procedures / Treatments   Labs (all labs ordered  are listed, but only abnormal results are displayed) Labs Reviewed  COMPREHENSIVE METABOLIC PANEL - Abnormal; Notable for the following components:      Result Value   Potassium 3.4 (*)    Glucose, Bld 127 (*)    Total Bilirubin 1.3 (*)    All other components within normal limits  URINALYSIS, ROUTINE W REFLEX MICROSCOPIC - Abnormal; Notable for the following components:   Hgb urine dipstick LARGE (*)    Leukocytes,Ua TRACE (*)    All other components within normal limits  URINALYSIS, MICROSCOPIC (REFLEX) - Abnormal; Notable for the following components:   Bacteria, UA MANY (*)    All other components within normal limits  CBC WITH DIFFERENTIAL/PLATELET  I-STAT BETA HCG BLOOD, ED ([facility], [facility], [facility] ONLY)    EKG None  Radiology DG Chest 2 View  Result Date: 01/22/2022 CLINICAL DATA:  Left chest pain.  Back and flank pain. EXAM: CHEST - 2 VIEW COMPARISON:  08/01/2021 FINDINGS: The cardiomediastinal silhouette is unchanged with normal heart size. Lung volumes are low. There is the suggestion of mild peribronchial thickening, and there is slight asymmetric density in the left lung base. No pleural effusion or pneumothorax is identified. Right upper quadrant abdominal surgical clips are noted. No acute osseous abnormality is seen. IMPRESSION: Low lung volumes with minimal left basilar opacity, likely atelectasis although early infection is not excluded. Electronically Signed   By: Sebastian Ache M.D.   On: 01/22/2022 13:09   CT ABDOMEN PELVIS W CONTRAST  Result Date: 01/22/2022 CLINICAL DATA:  Bilateral flank and lower abdominal pain in vaginal bleeding that began on 01/01/2022. EXAM: CT ABDOMEN AND PELVIS WITH CONTRAST TECHNIQUE: Multidetector CT imaging of the abdomen and pelvis was performed using the standard protocol following bolus administration of intravenous contrast. RADIATION DOSE REDUCTION: This exam was performed according to the departmental dose-optimization program which includes  automated exposure control, adjustment of the mA and/or kV according to patient size and/or use of iterative reconstruction technique. CONTRAST:  OMNIPAQUE IOHEXOL 300 MG/ML  SOLN COMPARISON:  06/30/2019 FINDINGS: Lower chest: Unremarkable. Hepatobiliary: No focal liver abnormality is seen. Status post cholecystectomy. No biliary dilatation. Pancreas: Unremarkable. No pancreatic ductal dilatation or surrounding inflammatory changes. Spleen: Normal in size without focal abnormality. Adrenals/Urinary Tract: Adrenal glands are unremarkable. Kidneys are normal, without renal calculi, focal lesion, or hydronephrosis. Bladder is unremarkable. Stomach/Bowel: Small hiatal hernia. Normal appearing small bowel, colon and appendix. Vascular/Lymphatic: No significant vascular findings are present. No enlarged abdominal or pelvic lymph nodes. Reproductive: Uterus and bilateral adnexa are unremarkable. Other: No abdominal wall hernia or abnormality. No abdominopelvic ascites. Musculoskeletal: Unremarkable bones. IMPRESSION: 1. No acute abnormality. 2. Small hiatal hernia. Electronically Signed   By: Beckie Salts M.D.   On:  01/22/2022 18:50    Procedures Procedures    Medications Ordered in ED Medications  iohexol (OMNIPAQUE) 300 MG/ML solution 100 mL (100 mLs Intravenous Contrast Given 01/22/22 1835)    ED Course/ Medical Decision Making/ A&P Clinical Course as of 01/22/22 1902  Wed Jan 22, 2022  1742 Nitrite: NEGATIVE [CG]    Clinical Course User Index [CG] Azucena Cecil, PA-C                           Medical Decision Making Amount and/or Complexity of Data Reviewed Labs: ordered. Decision-making details documented in ED Course. Radiology: ordered.   41 year old female presents due to vaginal bleeding, flank pain.  On examination, patient is afebrile, nontachycardic, not hypoxic, clear lung sounds bilaterally, has right-sided and left-sided CVA tenderness.  Patient worked up utilizing the  following labs are personally ordered and interpreted by me: CMP, beta-hCG, CBC, UA, CT abdomen pelvis CMP: Shows slightly decreased potassium of 3.4 Beta hCG: Negative CBC: Unremarkable.  Patient hemoglobin stable.  No white count to indicate infection UA: Shows hemoglobin, leukocytes. No nitrites.  CT abdomen pelvis: No acute abnormality.  At this time, I feel this patient most likely has pain related to pyelonephritis.  I have placed her on antibiotics.  I have referred her to a gynecologist for follow-up and evaluation of her abnormal uterine bleeding.  Her hemoglobin levels are stable, her bleeding is moderate but not severe.  Her CBC confirms this.  I have discussed the patient and her case with Dr. Billy Fischer who agrees with current plan for management.  The patient has been given return precautions and she voices understanding.  The patient has had all of her questions answered utilizing interpreter.  The patient is stable at time of discharge.  Final Clinical Impression(s) / ED Diagnoses Final diagnoses:  Vaginal bleeding  Pyelonephritis    Rx / DC Orders ED Discharge Orders          Ordered    cefpodoxime (VANTIN) 200 MG tablet  2 times daily        01/22/22 1856              Azucena Cecil, PA-C 01/22/22 Fidela Salisbury, MD 01/23/22 2209

## 2022-01-22 NOTE — ED Notes (Signed)
Patient transported to X-ray 

## 2022-01-22 NOTE — ED Triage Notes (Signed)
Patient here for evaluation of bilateral flank and lower abdominal pain and vaginal bleeding that started January 11th a few days before he amenstrual period. Patient states after her menstrual period started on January 16 the pain got worse. At that time patient reports also experiencing dysuria and heavier than normal vaginal bleeding. Patient alert, oriented, ambulatory, and in no apparent distress at this time.

## 2022-01-22 NOTE — ED Provider Triage Note (Signed)
Emergency Medicine Provider Triage Evaluation Note  Kimberly Avila , a 41 y.o. female  was evaluated in triage.  Pt complains of mid to lower back pain radiating to the bilateral lower abdomen over the past 3 weeks.  Patient has had some irritative urinary symptoms including dysuria.  No fevers or vomiting reported.  She had her normal period about 2 weeks ago but then resumed bleeding for the past several days.    Review of Systems  Positive: Dysuria, vaginal bleeding, back and abdominal pain Negative: Fever and vomiting  Physical Exam  BP (!) 141/96 (BP Location: Left Arm)    Pulse 81    Temp 97.9 F (36.6 C) (Oral)    Resp 11    SpO2 100%  Gen:   Awake, no distress   Resp:  Normal effort  MSK:   Moves extremities without difficulty  Other:  Abdomen is soft and nontender  Medical Decision Making  Medically screening exam initiated at 12:42 PM.  Appropriate orders placed.  SAHANA BOYLAND was informed that the remainder of the evaluation will be completed by another provider, this initial triage assessment does not replace that evaluation, and the importance of remaining in the ED until their evaluation is complete.     Renne Crigler, PA-C 01/22/22 1243

## 2022-01-22 NOTE — Discharge Instructions (Signed)
Please return to the ED with any new or worsening signs or symptoms such as lightheadedness, dizziness, weakness, chest pain, shortness of breath Please call and make an appointment tomorrow for the Center for women[facility] at [facility] for women.  I have attached the number to this discharge packet, you will need to call in the morning for an appointment Please return to the ED if you begin going through greater than 1 pad per hour.

## 2022-02-19 ENCOUNTER — Encounter: Payer: Self-pay | Admitting: Pediatric Intensive Care

## 2022-02-19 DIAGNOSIS — Z139 Encounter for screening, unspecified: Secondary | ICD-10-CM

## 2022-02-19 LAB — GLUCOSE, POCT (MANUAL RESULT ENTRY): POC Glucose: 138 mg/dL — AB (ref 70–99)

## 2022-02-19 NOTE — Congregational Nurse Program (Signed)
?  Dept: [phone] ? ? ?Congregational Nurse Program Note ? ?Date of Encounter: 02/19/2022 ? ?Past Medical History: ?Past Medical History:  ?Diagnosis Date  ? Gestational diabetes   ? during pregnancy  ? ? ?Encounter Details: ? CNP Questionnaire - 02/19/22 1600   ? ?  ? Questionnaire  ? Do you give verbal consent to treat you today? Yes   ? Location Patient Served  [facility]   ? Visit Setting Church or Organization   ? Patient Status Immigrant   ? Insurance Uninsured ([place] Card/Care Connects/Self-Pay)   ? Insurance Referral [facility]   ? Medication N/A   ? Medical Provider No   ? Screening Referrals Breast Cancer   ? Medical Referral [facility]   ? Medical Appointment Made [facility] PCP[facility]cl[facility]   ? Food N/A   ? Transportation N/A   ? Housing/Utilities N/A   ? Interpersonal Safety N/A   ? Intervention Blood pressure;Blood glucose;Case Management;Counsel;Navigate Healthcare System   ? ED Visit Averted N/A   ? Life-Saving Intervention Made N/A   ? ?  ?  ? ?  ? ?Initial encounter. Spanish interpretation via [employer] ID [id number]. Client states recent history of small lumps under left breast at 3 oclock and six oclock. These are palpable, 2cm and tender. Last mammogram was six years ago. Denies breast CA history for self or family. Denies discharge from breasts. States she is just finishing period. Also would like BG and BP check. Plan: refer to PCP and for mammogram scholarship program. Follow up in clinic next week. ?Shann Medal BSN RN CCNP ?[phone] ? ? ?

## 2022-02-26 ENCOUNTER — Ambulatory Visit (INDEPENDENT_AMBULATORY_CARE_PROVIDER_SITE_OTHER): Payer: Self-pay | Admitting: Primary Care

## 2022-02-26 ENCOUNTER — Encounter (INDEPENDENT_AMBULATORY_CARE_PROVIDER_SITE_OTHER): Payer: Self-pay | Admitting: Primary Care

## 2022-02-26 ENCOUNTER — Other Ambulatory Visit: Payer: Self-pay

## 2022-02-26 VITALS — BP 122/80 | HR 84 | Temp 97.8°F | Ht 62.0 in | Wt 169.2 lb

## 2022-02-26 DIAGNOSIS — Z131 Encounter for screening for diabetes mellitus: Secondary | ICD-10-CM

## 2022-02-26 DIAGNOSIS — E6609 Other obesity due to excess calories: Secondary | ICD-10-CM

## 2022-02-26 DIAGNOSIS — Z6830 Body mass index (BMI) 30.0-30.9, adult: Secondary | ICD-10-CM

## 2022-02-26 DIAGNOSIS — N6321 Unspecified lump in the left breast, upper outer quadrant: Secondary | ICD-10-CM

## 2022-02-26 LAB — POCT GLYCOSYLATED HEMOGLOBIN (HGB A1C): Hemoglobin A1C: 5.4 % (ref 4.0–5.6)

## 2022-02-26 NOTE — Patient Instructions (Signed)
Autoexamen de mamas ?Breast Self-Awareness ?El autoexamen de mamas es para Geologist, engineering apariencia y la sensibilidad de las [place]. Es importante autoexaminarse las [place]. Permite detectar un problema en las mamas a tiempo mientras todav?a es peque?o y puede tratarse. Todas las mujeres deben autoexaminarse las [place], incluso aquellas que se sometieron a implantes mamarios. Informe al m?dico si advierte un cambio en las mamas. ?Lo que necesita: ?Un espejo. ?Una habitaci?n bien iluminada. ?C?mo realizar el autoexamen de [place] ?El autoexamen de [place] es una forma de aprender qu? es normal para sus mamas y revisar si hay cambios. Para hacer un autoexamen de las mamas: ?Busque cambios ? ?Qu?tese toda la ropa por encima de la cintura. ?P?rese frente a un espejo en una habitaci?n con buena iluminaci?n. ?Apoye las [employer] caderas. ?Empuje hacia abajo con las manos. ?M?rese las mamas y los pezones en el espejo para ver si una mama o un pez?n se ve diferente del otro. Durante el examen, intente determinar si: ?La forma de una mama es diferente. ?El tama?o de [place] mama es diferente. ?Hay arrugas, depresiones y protuberancias en [place] Payment y no en la otra. ?Observe cada mama para buscar cambios en la piel, por ejemplo: ?Enrojecimiento. ?Zonas escamosas. ?Observe si hay cambios en los pezones, por ejemplo: ?L?quido alrededor [employer]. ?Sangrado. ?Hoyuelos. ?Enrojecimiento. ?Un cambio en el lugar de los pezones. ?[place] si hay cambios ? ?Acu?stese en el piso boca arriba. ?P?lpese cada mama. Para hacerlo, siga estos pasos: ?Elija una mama para palpar. ?Coloque el brazo m?s cercano a esa mama por encima de la cabeza. ?Use el otro brazo para palpar la zona del pez?n de la mama. P?lpese la zona con las yemas de los tres dedos del medio y haga c?rculos con los dedos. Con el primer c?rculo, presione suavemente. Con el segundo, m?s fuerte. Con el tercero, a?n m?s fuerte. ?Siga haciendo c?rculos con los dedos con las diferentes  presiones a medida que desciende por la mama. Det?ngase cuando sienta las costillas. ?Desplace los dedos un poco hacia el centro del cuerpo. ?Empiece a hacer c?rculos con los dedos nuevamente y esta vez haga movimientos ascendentes hasta llegar a la clav?cula. ?Siga haciendo c?rculos [place] y Milinda Antis llegar a la axila. Recuerde hacerlos con las tres presiones. ?P?lpese la otra mama de la misma forma. ?Si?ntese o p?rese en la ducha o la ba?era. ?Con agua jabonosa en la piel, p?lpese cada mama del mismo modo que lo hizo en el paso 2 mientras estaba acostada en el piso. ?Anote sus hallazgos ?Anotar lo que encuentra puede ayudarla a recordar qu? contarle al m?dico. Escriba los siguientes datos: ?Qu? es normal para cada mama. ?Cualquier cambio que encuentre en cada mama, por ejemplo: ?La clase de cambios que encuentra. ?Si tiene dolor. ?Si hay bultos, su tama?o y ubicaci?n. ?Cu?ndo tuvo su ?ltimo per?odo menstrual. ?Consejos generales ?Exam?nese las [employer]. ?Si est? amamantando, el mejor momento para el examen de las mamas es despu?s de darle de Psychologist, clinical al beb? o despu?s de Manufacturing engineer. ?Si tiene per?odos menstruales, el mejor momento para hacerlo es de 5 a 7 d?as despu?s de la finalizado el per?odo menstrual. ?Con el tiempo, se sentir? c?moda con el autoexamen y comenzar? a saber si hay cambios en sus mamas. ?Comun?quese con un m?dico si: ?Observa un cambio en la forma o el tama?o de las mamas o los pezones. ?Observa un cambio en la piel de las mamas o los pezones, como la  piel enrojecida o escamosa. ?Tiene secreci?n de l?quido proveniente de los pezones que no es normal. ?Encuentra un n?dulo o una zona engrosada que no ten?a antes. ?Media planner. ?Tiene alguna inquietud [employer] de la [place]. ?Resumen ?El autoexamen de mamas incluye buscar cambios en las [place], y tambi?n palpar para Engineer, manufacturing cualquier cambio en las mamas. ?El autoexamen de mamas debe hacerse frente a  un espejo en una habitaci?n bien iluminada. ?Debe revisarse las [employer]. Si tiene per?odos menstruales, el mejor momento para hacerlo es de 5 a 7 d?as despu?s de la finalizado el per?odo menstrual. ?Informe al m?dico si ve cambios en las mamas, como cambios en el tama?o, cambios en la piel, dolor o sensibilidad, o un l?quido inusual que sale de los pezones. ?Esta informaci?n no tiene Theme park manager el consejo del m?dico. Aseg?rese de hacerle al m?dico cualquier pregunta que tenga. ?Document Revised: 09/07/2018 Document Reviewed: 09/07/2018 ?Elsevier Patient Education ? 2022 Elsevier Inc. ? ?

## 2022-02-26 NOTE — Progress Notes (Signed)
?[facility] ? ?Kimberly Avila, is a 41 y.o. female ? ?[id number] ? ?[id number] ? ?DOB - 1981-01-27 ? ?Chief Complaint  ?Patient presents with  ? Breast Mass  ?  LEFT BREAST FELT 8 DAYS AGO   ?    ? ?Subjective:  ? ?Kimberly Avila is a 41 y.o. Hispanic female [name] [phone] today for concerns of a breast mass felt on left breast approximately 3 :30.and 6 o'clock ( She has felt this for 8 days ago.Patient's last menstrual period was 02/14/2022 (exact date).  Patient has No headache, No chest pain, No abdominal pain - No Nausea, No new weakness tingling or numbness, No Cough - SOB. ? ?No problems updated. ? ?Allergies  ?Allergen Reactions  ? Gelatin Itching  ? Ibuprofen Rash  ? Nyquil [Pseudoeph-Doxylamine-Dm-Apap] Itching  ? Tylenol [Acetaminophen] Itching  ? ? ?Past Medical History:  ?Diagnosis Date  ? Gestational diabetes   ? during pregnancy  ? ? ?No current outpatient medications on file prior to visit.  ? ?No current facility-administered medications on file prior to visit.  ? ? ?Objective:  ? ?Vitals:  ? 02/26/22 1500  ?BP: 122/80  ?Pulse: 84  ?Temp: 97.8 ?F (36.6 ?C)  ?TempSrc: Oral  ?SpO2: 98%  ?Weight: 169 lb 3.2 oz (76.7 kg)  ?Height: 5\' 2"  (1.575 m)  ? ? ?Exam ?General appearance : Awake, alert, not in any distress. Speech Clear. Not toxic looking ?HEENT: Atraumatic and Normocephalic, pupils equally reactive to light and accomodation ?Neck: Supple, no JVD. No cervical lymphadenopathy.  ?Chest: Good air entry bilaterally, no added sounds  ?CVS: S1 S2 regular, no murmurs.  ?BREASTS: Symmetric in size. No masses, skin changes, nipple drainage, or lymphadenopathy. Taught self breast exam and had patient to demonstrate SBE. ?Abdomen: Bowel sounds present, Non tender and not distended with no gaurding, rigidity or rebound. ?Extremities: B/L Lower Ext shows no edema, both legs are warm to touch ?Neurology: Awake alert, and oriented X 3, CN II-XII intact, Non focal ?Skin: No  Rash ? ?Data Review ?Lab Results  ?Component Value Date  ? HGBA1C 5.4 02/26/2022  ? HGBA1C 5.3 05/11/2019  ? ? ?Assessment & Plan  ? ?[name] was seen today for breast mass. ? ?Diagnoses and all orders for this visit: ? ?Mass of upper outer quadrant of left breast ?Undetectable with breast exam did find a ingrown hair on left breast. No family hx of breast cancer and patient to demonstrate SBE. ? ?Screening for diabetes mellitus ?-     HgB A1c - HgB A1c 5.4 - not diabetic per ADA guidelines  ? ?Class 1 obesity due to excess calories without serious comorbidity with body mass index (BMI) of 30.0 to 30.9 in adult ?Obesity is 30-39 indicating an excess in caloric intake or underlining conditions. This may lead to other co-morbidities. Lifestyle modifications of diet and exercise may reduce obesity.   ?  ? ?Patient have been counseled extensively about nutrition and exercise. Other issues discussed during this visit include: low cholesterol diet, weight control and daily exercise, foot care, annual eye examinations at Ophthalmology, importance of adherence with medications and regular follow-up. We also discussed long term complications of uncontrolled diabetes and hypertension.  ? ?Return if symptoms worsen or fail to improve. ? ?The patient was given clear instructions to go to ER or return to medical center if symptoms don't improve, worsen or new problems develop. The patient verbalized understanding. The patient was told to call to get lab results if they haven't heard anything  in the next week.  ? ?This note has been created with Education officer, environmental. Any transcriptional errors are unintentional.  ? ?Kimberly Sessions, NP ?03/01/2022, 9:56 PM  ?

## 2022-02-27 ENCOUNTER — Ambulatory Visit (INDEPENDENT_AMBULATORY_CARE_PROVIDER_SITE_OTHER): Payer: Self-pay | Admitting: Primary Care

## 2022-02-27 ENCOUNTER — Encounter (INDEPENDENT_AMBULATORY_CARE_PROVIDER_SITE_OTHER): Payer: Self-pay | Admitting: Primary Care

## 2022-02-27 VITALS — BP 127/77 | HR 63 | Temp 97.5°F | Wt 168.6 lb

## 2022-02-27 DIAGNOSIS — M25559 Pain in unspecified hip: Secondary | ICD-10-CM

## 2022-02-27 MED ORDER — FLUCONAZOLE 150 MG PO TABS: 150.0000 mg | ORAL_TABLET | Freq: Every day | ORAL | 0 refills | Status: DC

## 2022-02-27 NOTE — Progress Notes (Signed)
? ?Acute Office Visit ? ?Subjective:  ? ? Patient ID: Kimberly Avila, female    DOB: 06/11/1981, 41 y.o.   MRN: [id number] ? ?Chief Complaint  ?Patient presents with  ? Abdominal Pain  ?  Lower. Pain when she sits   ? ? ?HPI ? Ms. Kimberly Avila is a 41 y.o. Hispanic female.(Interpreter Marolyn Haller  [phone])  I was consulted for evaluation of pelvic pain only when she sits down and walks.  Onset of symptoms was abrupt starting 3 months ago with intermittent  course since that time. The pain occurs prior to onset of menses and with intercourse. It is located in the pelvic and lasts 10 minutes. She describes the pain as sharp, aching, and intermittent. Symptoms improve with none.  ?Past Medical History:  ?Diagnosis Date  ? Gestational diabetes   ? during pregnancy  ? ? ?Past Surgical History:  ?Procedure Laterality Date  ? CHOLECYSTECTOMY N/A 06/25/2019  ? Procedure: LAPAROSCOPIC CHOLECYSTECTOMY;  Surgeon: Emelia Loron, MD;  Location: [facility] OR;  Service: General;  Laterality: N/A;  ? NO PAST SURGERIES    ? ? ?Family History  ?Problem Relation Age of Onset  ? Diabetes Maternal Uncle   ? ? ?Social History  ? ?Socioeconomic History  ? Marital status: Married  ?  Spouse name: Not on file  ? Number of children: Not on file  ? Years of education: Not on file  ? Highest education level: Not on file  ?Occupational History  ? Not on file  ?Tobacco Use  ? Smoking status: Never  ? Smokeless tobacco: Never  ?Substance and Sexual Activity  ? Alcohol use: No  ? Drug use: No  ? Sexual activity: Yes  ?  Birth control/protection: None, Implant  ?  Comment: wants birth control unable to pay for tubal  ?Other Topics Concern  ? Not on file  ?Social History Narrative  ? ** Merged History Encounter **  ?    ? ?Social Determinants of Health  ? ?Financial Resource Strain: Not on file  ?Food Insecurity: Not on file  ?Transportation Needs: Not on file  ?Physical Activity: Not on file  ?Stress: Not on file  ?Social Connections: Not on  file  ?Intimate Partner Violence: Not on file  ? ? ?No outpatient medications prior to visit.  ? ?No facility-administered medications prior to visit.  ? ? ?Allergies  ?Allergen Reactions  ? Gelatin Itching  ? Ibuprofen Rash  ? Nyquil [Pseudoeph-Doxylamine-Dm-Apap] Itching  ? Tylenol [Acetaminophen] Itching  ? ? ?Review of Systems ?Comprehensive ROS Pertinent positive and negative noted in HPI   ?   ?Objective:  ? ?BP 127/77 (BP Location: Right Arm, Patient Position: Sitting, Cuff Size: Normal)   Pulse 63   Temp (!) 97.5 ?F (36.4 ?C) (Oral)   Wt 168 lb 9.6 oz (76.5 kg)   LMP 02/14/2022 (Exact Date)   SpO2 94%   BMI 30.84 kg/m?  ?Wt Readings from Last 3 Encounters:  ?02/27/22 168 lb 9.6 oz (76.5 kg)  ?02/26/22 169 lb 3.2 oz (76.7 kg)  ?06/25/19 172 lb 6.4 oz (78.2 kg)  ?Physical exam: ?General: Vital signs reviewed.  Patient is well-developed and well-nourished,female in no acute distress and cooperative with exam. ?Head: Normocephalic and atraumatic. ?Eyes: EOMI, conjunctivae normal, no scleral icterus. ?Neck: Supple, trachea midline, normal ROM, no JVD, masses, thyromegaly, or carotid bruit present. ?Cardiovascular: RRR, S1 normal, S2 normal, no murmurs, gallops, or rubs. ?Pulmonary/Chest: Clear to auscultation bilaterally, no wheezes, rales, or rhonchi. ?Abdominal:  Soft, non-tender, non-distended, BS +, no masses, organomegaly, or guarding present. ?Musculoskeletal: No joint deformities, erythema, or stiffness, ROM full and nontender. ?Extremities: No lower extremity edema bilaterally,  pulses symmetric and intact bilaterally. No cyanosis or clubbing. ?Neurological: A&O x3, Strength is normal ?Skin: Warm, dry and intact. No rashes or erythema. ?Psychiatric: Normal mood and affect. speech and behavior is normal. Cognition and memory are normal. ?   ? ?Health Maintenance Due  ?Topic Date Due  ? COVID-19 Vaccine (1) Never done  ? FOOT EXAM  Never done  ? OPHTHALMOLOGY EXAM  Never done  ? URINE MICROALBUMIN   Never done  ? Hepatitis C Screening  Never done  ? ? ?There are no preventive care reminders to display for this patient. ? ? ?No results found for: TSH ?Lab Results  ?Component Value Date  ? WBC 8.3 01/22/2022  ? HGB 14.1 01/22/2022  ? HCT 41.8 01/22/2022  ? MCV 86.5 01/22/2022  ? PLT 274 01/22/2022  ? ?Lab Results  ?Component Value Date  ? NA 139 01/22/2022  ? K 3.4 (L) 01/22/2022  ? CO2 25 01/22/2022  ? GLUCOSE 127 (H) 01/22/2022  ? BUN 10 01/22/2022  ? CREATININE 0.70 01/22/2022  ? BILITOT 1.3 (H) 01/22/2022  ? ALKPHOS 53 01/22/2022  ? AST 19 01/22/2022  ? ALT 14 01/22/2022  ? PROT 7.2 01/22/2022  ? ALBUMIN 3.9 01/22/2022  ? CALCIUM 9.2 01/22/2022  ? ANIONGAP 9 01/22/2022  ? ?Lab Results  ?Component Value Date  ? CHOL 124 05/08/2010  ? ?Lab Results  ?Component Value Date  ? HDL 50 05/08/2010  ? ?Lab Results  ?Component Value Date  ? LDLCALC 54 05/08/2010  ? ?Lab Results  ?Component Value Date  ? TRIG 101 05/08/2010  ? ?Lab Results  ?Component Value Date  ? CHOLHDL 2.5 Ratio 05/08/2010  ? ?Lab Results  ?Component Value Date  ? HGBA1C 5.4 02/26/2022  ? ? ?   ?Assessment & Plan:  ?Kimberly Avila was seen today for abdominal pain. ? ?Diagnoses and all orders for this visit: ? ?Pain in joint involving pelvic region and thigh, unspecified laterality ?-     Cancel: Ambulatory referral to Gynecology ? ?Other orders ?-     fluconazole (DIFLUCAN) 150 MG tablet; Take 1 tablet (150 mg total) by mouth daily. ? ?Meds ordered this encounter  ?Medications  ? fluconazole (DIFLUCAN) 150 MG tablet  ?  Sig: Take 1 tablet (150 mg total) by mouth daily.  ?  Dispense:  1 tablet  ?  Refill:  0  ? ? ? ?Grayce Sessions, NP ? ?

## 2022-02-27 NOTE — Patient Instructions (Addendum)
Departamemto de salud ?[place] [phone] ?Open ? Closes 5?PMDolor p?lvico en la mujer ?Pelvic Pain, Female ?El dolor p?lvico se siente en la parte inferior del vientre (abdomen), debajo del ombligo y a nivel de las caderas. El dolor puede tener las siguientes caracter?sticas: ?Comenzar de repente (ser agudo). ?Volver a presentarse (ser recurrente). ?Durar mucho tiempo (volverse cr?nico). El dolor p?lvico que dura m?s de 6 meses se denomina dolor p?lvico cr?nico. ?El dolor p?lvico puede tener muchas causas. A veces, la causa del dolor p?lvico no se conoce. ?Siga estas instrucciones en su casa: ? ?Use los medicamentos de venta libre y los recetados solamente como se lo haya indicado el m?dico. ?Haga reposo como se lo haya indicado el m?dico. ?No tenga sexo si siente dolor. ?Lleve un registro del dolor p?lvico. Escriba los siguientes datos: ?Cu?ndo comenz? Conservation officer, historic buildings. ?La ubicaci?n del dolor. ?Qu? parece mejorar o empeorar el dolor, como alimentos o su per?odo mensual (ciclo menstrual). ?Cualquier s?ntoma que se presente junto con Conservation officer, historic buildings. ?Concurra a [place]. ?Comun?quese con un m?dico si: ?El medicamento no [employer], o el dolor regresa. ?Aparecen nuevos s?ntomas. ?Tiene sangrado o secreci?n inusual de la vagina. ?Tiene fiebre o escalofr?os. ?Tiene dificultad para defecar (estre?imiento). ?Observa sangre en el pis (orina) o en la materia fecal (heces). ?El pis tiene mal olor. ?Se siente d?bil o siente que va a desvanecerse. ?Solicite ayuda de inmediato si: ?Siente un dolor repentino y [place] intenso. ?Siente un dolor muy intenso y tambi?n tiene alguno de estos s?ntomas: ?Cristy Hilts. ?Sensaci?n de que va a vomitar (n?useas). ?V?mitos. ?Tiene mucho sudor. ?Se desmaya. ?Estos s?ntomas pueden Sales executive. Solicite ayuda de inmediato. Comun?quese con el servicio de emergencias de su localidad (911 en los [place]). ?No espere a ver si los s?ntomas desaparecen. ?No  conduzca por sus propios medios [employer]. ?Resumen ?El dolor p?lvico se siente en la parte inferior del vientre (abdomen), debajo del ombligo y a nivel de las caderas. ?El dolor p?lvico puede tener muchas causas. ?Lleve un registro del dolor p?lvico. ?Esta informaci?n no tiene Marine scientist el consejo del m?dico. Aseg?rese de hacerle al m?dico cualquier pregunta que tenga. ?Document Revised: 05/09/2021 Document Reviewed: 05/09/2021 ?Elsevier Patient Education ? [place]. ? ?

## 2022-03-05 ENCOUNTER — Encounter: Payer: Self-pay | Admitting: Pediatric Intensive Care

## 2022-03-05 NOTE — Congregational Nurse Program (Signed)
?  Dept: [phone] ? ? ?Congregational Nurse Program Note ? ?Date of Encounter: 03/05/2022 ? ?Past Medical History: ?Past Medical History:  ?Diagnosis Date  ? Gestational diabetes   ? during pregnancy  ? ? ?Encounter Details: Client states she was able to go to PCP appointment. She is relieved that provider stated that breast lumps are related to menstrual cycle only. She will return this week for a PAP. Follow up in clinic as needed. ?Lisette Abu BSN RN CCNP ?[phone] ? ? ? ? ?

## 2022-03-13 ENCOUNTER — Other Ambulatory Visit: Payer: Self-pay

## 2022-03-13 ENCOUNTER — Encounter (INDEPENDENT_AMBULATORY_CARE_PROVIDER_SITE_OTHER): Payer: Self-pay | Admitting: Primary Care

## 2022-03-13 ENCOUNTER — Ambulatory Visit (INDEPENDENT_AMBULATORY_CARE_PROVIDER_SITE_OTHER): Payer: Self-pay | Admitting: Primary Care

## 2022-03-13 VITALS — BP 121/78 | HR 70 | Temp 97.8°F | Ht 62.0 in | Wt 169.2 lb

## 2022-03-13 DIAGNOSIS — R300 Dysuria: Secondary | ICD-10-CM

## 2022-03-13 DIAGNOSIS — Y93E8 Activity, other personal hygiene: Secondary | ICD-10-CM

## 2022-03-13 LAB — POCT URINALYSIS DIP (CLINITEK)
Bilirubin, UA: NEGATIVE
Blood, UA: NEGATIVE
Glucose, UA: NEGATIVE mg/dL
Ketones, POC UA: NEGATIVE mg/dL
Leukocytes, UA: NEGATIVE
Nitrite, UA: NEGATIVE
POC PROTEIN,UA: NEGATIVE
Spec Grav, UA: <=1.005 — AB (ref 1.010–1.025)
Urobilinogen, UA: 0.2 E.U./dL
pH, UA: 6 (ref 5.0–8.0)

## 2022-03-13 NOTE — Progress Notes (Signed)
? ? ?   [facility] ? ?Subjective:  ? ? Ms. Kimberly Avila is a 41 y.o. Hispanic female([place] [phone])  who complains of burning with urination, dysuria, and frequency for 6 weeks.  Patient also complains of back pain. Patient denies congestion, cough, fever, and headache.  Patient does have a history of recurrent UTI.  Patient does have a history of pyelonephritis. ?The following portions of the patient's history were reviewed and updated as appropriate: allergies, current medications, past family history, past medical history, past social history, and past surgical history. ?Review of Systems ?Pertinent items noted in HPI and remainder of comprehensive ROS otherwise negative.  ?  ?Objective:  ? ? BP 121/78 (BP Location: Right Arm, Patient Position: Sitting, Cuff Size: Large)   Pulse 70   Temp 97.8 ?F (36.6 ?C) (Oral)   Ht 5\' 2"  (1.575 m)   Wt 169 lb 3.2 oz (76.7 kg)   LMP 02/14/2022 (Exact Date)   SpO2 95%   BMI 30.95 kg/m?  ?General: alert, appears stated age, no distress, and mildly obese  ?Abdomen: soft, non-tender, without masses or organomegaly in the left flank and in the right flank  ?Back: bilateral CVA tenderness  ?GU: defer exam  ? ?Laboratory:  ?Urine dipstick shows negative for all components.   ? ?  ?Assessment:  ?Kimberly Avila was seen today for back pain. ? ?Diagnoses and all orders for this visit: ? ?Dysuria ?POCT URINALYSIS DIP (CLINITEK) ? 1. Medications: not indicated at this time ?2. Maintain adequate hydration, water , 100% cranberry juice  ?3. Follow up if symptoms not improving, and prn.  ?4. Acidophilous yogurt ? ?Activities involving personal hygiene ?Discussed risk for UTI in woman ? ? ?This note has been created with Byrd Hesselbach. Any transcriptional errors are unintentional.  ? ?Education officer, environmental, NP ?03/13/2022, 10:52 AM  ?

## 2022-03-20 ENCOUNTER — Ambulatory Visit (INDEPENDENT_AMBULATORY_CARE_PROVIDER_SITE_OTHER): Payer: No Typology Code available for payment source | Admitting: Primary Care

## 2022-04-09 ENCOUNTER — Ambulatory Visit (INDEPENDENT_AMBULATORY_CARE_PROVIDER_SITE_OTHER): Payer: Self-pay | Admitting: *Deleted

## 2022-04-09 LAB — GLUCOSE, POCT (MANUAL RESULT ENTRY): POC Glucose: 131 mg/dl — AB (ref 70–99)

## 2022-04-09 NOTE — Telephone Encounter (Signed)
Call received from Kimberly Medal, RN from Congregational nursing [phone] to report patient c/o chst pain comes and goes at times since 1 month and 1/2. Denies chest pain now. Reports episodes as pain in left breast area radiating to left arm and jaw. Denies N/V sweating. No dizziness , no lightheadedness. No available appt within 3 days per protocol. Reviewed with RN translating with patient if chest pain noted call back or go to ED if difficulty breathing , sweating dizziness occur greater than 5  minutes. Patient verbalized understanding .  ? ? ? Reason for Disposition ? [1] Chest pain(s) lasting a few seconds AND [2] persists > 3 days ? ?Answer Assessment - Initial Assessment Questions ?1. LOCATION: "Where does it hurt?"   ?    Congregational nurse with patient and report no chest pain at this time. Reports episodes of chest pain left chest breast area at times  ?2. RADIATION: "Does the pain go anywhere else?" (e.g., into neck, jaw, arms, back) ?    Left arm. jaw ?3. ONSET: "When did the chest pain begin?" (Minutes, hours or days)  ?    X 1 month and 1/2 ago  ?4. PATTERN "Does the pain come and go, or has it been constant since it started?"  "Does it get worse with exertion?"  ?    Comes and goes  ?5. DURATION: "How long does it last" (e.g., seconds, minutes, hours) ?    Did not report but not greater than 5  minutes  ?6. SEVERITY: "How bad is the pain?"  (e.g., Scale 1-10; mild, moderate, or severe) ?   - MILD (1-3): doesn't interfere with normal activities  ?   - MODERATE (4-7): interferes with normal activities or awakens from sleep ?   - SEVERE (8-10): excruciating pain, unable to do any normal activities   ?    na ?7. CARDIAC RISK FACTORS: "Do you have any history of heart problems or risk factors for heart disease?" (e.g., angina, prior heart attack; diabetes, high blood pressure, high cholesterol, smoker, or strong family history of heart disease) ?    See hx ?8. PULMONARY RISK FACTORS: "Do you  have any history of lung disease?"  (e.g., blood clots in lung, asthma, emphysema, birth control pills) ?    na ?9. CAUSE: "What do you think is causing the chest pain?" ?    Unknown  ?10. OTHER SYMPTOMS: "Do you have any other symptoms?" (e.g., dizziness, nausea, vomiting, sweating, fever, difficulty breathing, cough) ?      Denies  ?11. PREGNANCY: "Is there any chance you are pregnant?" "When was your last menstrual period?" ?      na ? ?Protocols used: Chest Pain-A-AH ? ?

## 2022-04-16 NOTE — Telephone Encounter (Signed)
Attempted to reach patient with pacific interpreter [phone]) there was no answer. Per DPR advised patient to return call to office to schedule or present to ED for chest pain.  ?

## 2022-04-18 ENCOUNTER — Encounter: Payer: Self-pay | Admitting: Pediatric Intensive Care

## 2022-04-18 DIAGNOSIS — Z139 Encounter for screening, unspecified: Secondary | ICD-10-CM

## 2022-04-18 NOTE — Congregational Nurse Program (Signed)
?  Dept: [phone] ? ? ?Congregational Nurse Program Note ? ?Date of Encounter: 04/18/2022 ? ?Past Medical History: ?Past Medical History:  ?Diagnosis Date  ? Gestational diabetes   ? during pregnancy  ? ? ?Encounter Details: ? CNP Questionnaire - 04/09/22 1545   ? ?  ? Questionnaire  ? Do you give verbal consent to treat you today? Yes   ? Location Patient Served  [facility]   ? Visit Setting Church or Organization   ? Patient Status Immigrant   ? Insurance Uninsured (Orange Card/Care Connects/Self-Pay)   ? Insurance Referral [facility]   ? Medication N/A   ? Medical Provider No   ? Screening Referrals Breast Cancer   ? Medical Referral [facility]   ? Medical Appointment Made [facility] PCP/clinic   ? Food N/A   ? Transportation N/A   ? Housing/Utilities N/A   ? Interpersonal Safety N/A   ? Intervention Case Management;Counsel;[facility]igate [facility]   ? ED Visit Averted N/A   ? Life-Saving Intervention Made N/A   ? ?  ?  ? ?  ? ?Encounter with client Pacific Interpretation ID# [id number]. Client in for BP/BG check. During encounter, client explained that she has been having intermittent left chest pain with some radiation to jaw but not arms. CN called [facility] and was connected to Nurse Triage- they spoke with client. Triage advised client to go to ED/call 911 if continue to have symptoms. This CN advised same. Client states none of those symptoms at present. BP/BG within normal limits. Follow up in clinic next week. ?Shann Medal BSN RN CCNP ?[phone] ? ? ?

## 2022-04-30 DIAGNOSIS — Z139 Encounter for screening, unspecified: Secondary | ICD-10-CM

## 2022-05-05 NOTE — Congregational Nurse Program (Signed)
  Dept: [phone]   Congregational Nurse Program Note  Date of Encounter: 04/30/2022 BP 123/75   CBG 123  Past Medical History: Past Medical History:  Diagnosis Date   Gestational diabetes    during pregnancy    Encounter Details:  CNP Questionnaire - 04/30/22 1716       Questionnaire   Do you give verbal consent to treat you today? Yes    Location Patient Served  [facility]    Visit Setting Church or Organization    Patient Status Immigrant    Insurance Unknown    Insurance Referral N/A    Medical Referral N/A    Medical Appointment Made N/A    Food N/A    Transportation N/A    Housing/Utilities N/A    Intervention Blood pressure;Blood glucose;Advocate;Counsel;Educate;Support    ED Visit Averted N/A    Life-Saving Intervention Made N/A

## 2022-05-15 DIAGNOSIS — Z139 Encounter for screening, unspecified: Secondary | ICD-10-CM

## 2022-05-15 LAB — GLUCOSE, POCT (MANUAL RESULT ENTRY): POC Glucose: 124 mg/dl — AB (ref 70–99)

## 2022-05-15 NOTE — Congregational Nurse Program (Signed)
  Dept: [phone]   Congregational Nurse Program Note  Date of Encounter: 05/15/2022 05/14/22 1600 BP 125/83  CBG 124  Patient to [facility] [place] clinic c/o pain in kidneys, discomfort with urination, and edema. Has been seen by [facility]  and is requesting appt with [facility] for follow up. Advised pt that she must submit her orange card application for [facility]. Meantime, advised pt to return to [facility] for continued care and return to [facility] next week for check.  Estanislado Pandy, RN, CCNP   Dept: [phone]   Congregational Nurse Program Note  Date of Encounter: 05/15/2022  Past Medical History: Past Medical History:  Diagnosis Date   Gestational diabetes    during pregnancy    Encounter Details:  CNP Questionnaire - 05/14/22 1600       Questionnaire   Do you give verbal consent to treat you today? Yes    Location Patient [place] or Organization    Patient Status Immigrant    Insurance Unknown    Insurance Referral Orange Card/Care Connects    Medication N/A    Medical Provider Yes    Screening Referrals N/A    Medical Referral N/A    Medical Appointment Made Other    Food N/A    Transportation N/A    Housing/Utilities N/A    Interpersonal Safety N/A    Intervention Blood pressure;Blood glucose    ED Visit Averted N/A    Life-Saving Intervention Made N/A                  Past Medical History: Past Medical History:  Diagnosis Date   Gestational diabetes    during pregnancy    Encounter Details:  CNP Questionnaire - 05/14/22 1600       Questionnaire   Do you give verbal consent to treat you today? Yes    Location Patient [place] or Organization    Patient Status Immigrant    Insurance Unknown    Insurance Referral [facility]    Medication N/A    Medical Provider Yes    Screening Referrals N/A    Medical Referral N/A    Medical  Appointment Made Other    Food N/A    Transportation N/A    Housing/Utilities N/A    Interpersonal Safety N/A    Intervention Blood pressure;Blood glucose    ED Visit Averted N/A    Life-Saving Intervention Made N/A

## 2022-07-14 ENCOUNTER — Inpatient Hospital Stay (HOSPITAL_COMMUNITY): Payer: No Typology Code available for payment source

## 2022-07-14 ENCOUNTER — Other Ambulatory Visit: Payer: Self-pay

## 2022-07-14 ENCOUNTER — Inpatient Hospital Stay (HOSPITAL_COMMUNITY)
Admission: AD | Admit: 2022-07-14 | Discharge: 2022-07-14 | Disposition: A | Discharge status: Home / Self Care | Payer: No Typology Code available for payment source | Attending: Obstetrics and Gynecology | Admitting: Obstetrics and Gynecology

## 2022-07-14 ENCOUNTER — Encounter (HOSPITAL_COMMUNITY): Payer: Self-pay | Admitting: Obstetrics and Gynecology

## 2022-07-14 DIAGNOSIS — O039 Complete or unspecified spontaneous abortion without complication: Secondary | ICD-10-CM | POA: Insufficient documentation

## 2022-07-14 DIAGNOSIS — Z3A09 9 weeks gestation of pregnancy: Secondary | ICD-10-CM

## 2022-07-14 DIAGNOSIS — Z789 Other specified health status: Secondary | ICD-10-CM

## 2022-07-14 DIAGNOSIS — N939 Abnormal uterine and vaginal bleeding, unspecified: Secondary | ICD-10-CM | POA: Insufficient documentation

## 2022-07-14 LAB — CBC
HCT: 39.6 % (ref 36.0–46.0)
Hemoglobin: 13.6 g/dL (ref 12.0–15.0)
MCH: 28.9 pg (ref 26.0–34.0)
MCHC: 34.3 g/dL (ref 30.0–36.0)
MCV: 84.1 fL (ref 80.0–100.0)
Platelets: 270 10*3/uL (ref 150–400)
RBC: 4.71 MIL/uL (ref 3.87–5.11)
RDW: 13.1 % (ref 11.5–15.5)
WBC: 9.5 10*3/uL (ref 4.0–10.5)
nRBC: 0 % (ref 0.0–0.2)

## 2022-07-14 LAB — WET PREP, GENITAL
Clue Cells Wet Prep HPF POC: NONE SEEN
Sperm: NONE SEEN
Trich, Wet Prep: NONE SEEN
WBC, Wet Prep HPF POC: <10 (ref <10)
Yeast Wet Prep HPF POC: NONE SEEN

## 2022-07-14 LAB — POCT PREGNANCY, URINE: Preg Test, Ur: POSITIVE — AB

## 2022-07-14 LAB — HCG, QUANTITATIVE, PREGNANCY: hCG, Beta Chain, Quant, S: 1645 m[IU]/mL — ABNORMAL HIGH (ref <5)

## 2022-07-14 LAB — ABO/RH: ABO/RH(D): A POS

## 2022-07-14 MED ORDER — DIPHENHYDRAMINE HCL 50 MG/ML IJ SOLN: 12.5000 mg | Freq: Once | INTRAMUSCULAR | Status: DC

## 2022-07-14 MED ORDER — KETOROLAC TROMETHAMINE 60 MG/2ML IM SOLN
60.0000 mg | Freq: Once | INTRAMUSCULAR | Status: AC
  Administered 2022-07-14: 60 mg via INTRAMUSCULAR
  Filled 2022-07-14: qty 2

## 2022-07-14 MED ORDER — OXYCODONE HCL 5 MG PO TABS: 5.0000 mg | ORAL_TABLET | ORAL | 0 refills | Status: DC | PRN

## 2022-07-14 NOTE — MAU Note (Signed)
.  Kimberly Avila is a 41 y.o. at Unknown here in MAU reporting: Had positive HPT in JUne. Last night started having some cramping and spotting. Today bleeding got heavier about 1 hr ago passed a clot (golf ball sized clot). ANd soaked a pad on the  way over. Feels like smething is right in her vagina. LMP: 05/09/22 Onset of complaint: yesterday Pain score: 6 Vitals:   07/14/22 1537  BP: (!) 142/84  Pulse: 83  Resp: 18  Temp: 98 F (36.7 C)     FHT:n/a Lab orders placed from triage:  upt

## 2022-07-14 NOTE — MAU Provider Note (Signed)
Due to language barrier, an interpreter was present during the history-taking and subsequent discussion (and for part of the physical exam) with this patient. In person Interpreter Sunday Spillers  History     CSN: [id number]  Arrival date and time: 07/14/22 1502   Event Date/Time   First Provider Initiated Contact with Patient 07/14/22 1611      Chief Complaint  Patient presents with   Vaginal Bleeding   Kimberly Avila, a  41 y.o. [id number] at [redacted]w[redacted]d presents to MAU with complaints of vaginal bleeding and passing clots. Patient states she had some spotting and cramping that started yesterday. When she got up this morning she was still spotting and cramping. She did some chores around the house and then sat down. Patient states she noticed heavier bleeding and started passing clots. She saturated 1 pad on the way here  to MAU. She notes the largest clot as "golf ball size" and some smaller ones throughout.   She also endorses pelvic pain and pressure and feel like "something is right there."  Notes constant abdominal pain and cramping currently rating pain a 6/10. No attempts at relieving pain. Last intercourse was Saturday without complaints.          OB History     Gravida  7   Para  6   Term  6   Preterm      AB      Living  6      SAB      IAB      Ectopic      Multiple      Live Births  6           Past Medical History:  Diagnosis Date   Gestational diabetes    during pregnancy    Past Surgical History:  Procedure Laterality Date   CHOLECYSTECTOMY N/A 06/25/2019   Procedure: LAPAROSCOPIC CHOLECYSTECTOMY;  Surgeon: Rolm Bookbinder, MD;  Location: [facility] OR;  Service: General;  Laterality: N/A;    Family History  Problem Relation Age of Onset   Diabetes Maternal Uncle     Social History   Tobacco Use   Smoking status: Never   Smokeless tobacco: Never  Vaping Use   Vaping Use: Never used  Substance Use Topics   Alcohol use: No   Drug use: No     Allergies:  Allergies  Allergen Reactions   Gelatin Itching   Ibuprofen Rash   Nyquil [Pseudoeph-Doxylamine-Dm-Apap] Itching   Tylenol [Acetaminophen] Itching    No medications prior to admission.    Review of Systems  Constitutional:  Negative for chills, fatigue and fever.  Respiratory:  Negative for apnea, shortness of breath and wheezing.   Cardiovascular:  Negative for chest pain.  Gastrointestinal:  Positive for abdominal pain. Negative for constipation, diarrhea, nausea and vomiting.  Genitourinary:  Positive for pelvic pain and vaginal bleeding. Negative for difficulty urinating, dysuria, flank pain, vaginal discharge and vaginal pain.  Neurological:  Negative for weakness, light-headedness and headaches.  Psychiatric/Behavioral:  Negative for confusion.    Physical Exam   Blood pressure (!) 142/84, pulse 83, temperature 98 F (36.7 C), resp. rate 18, last menstrual period 05/09/2022.  Physical Exam Vitals and nursing note reviewed. Exam conducted with a chaperone present.  Constitutional:      Appearance: Normal appearance.  HENT:     Head: Normocephalic.  Cardiovascular:     Rate and Rhythm: Normal rate.  Pulmonary:     Effort: Pulmonary effort is normal.  Abdominal:     Palpations: Abdomen is soft.     Tenderness: There is abdominal tenderness. There is no guarding.  Genitourinary:    General: Normal vulva.     Vagina: Bleeding present.     Cervix: Dilated.     Comments: Small sac of fluid c/s gestational sac noted at vaginal introitus. Golf ball sized clot noted in the vaginal vault. Small cot noted inside cervix. Attempted to grasp with ring forceps. Patient became very uncomfortable and unable to grasp.  Musculoskeletal:        General: Normal range of motion.     Cervical back: Normal range of motion.  Skin:    General: Skin is warm and dry.  Neurological:     Mental Status: She is alert and oriented to person, place, and time.     MAU Course   Procedures Lab Orders         Wet prep, genital         CBC         hCG, quantitative, pregnancy         Pregnancy, urine POC    Meds ordered this encounter  Medications   ketorolac (TORADOL) injection 60 mg   diphenhydrAMINE (BENADRYL) injection 12.5 mg   Results for orders placed or performed during the hospital encounter of 07/14/22 (from the past 24 hour(s))  Pregnancy, urine POC     Status: Abnormal   Collection Time: 07/14/22  3:50 PM  Result Value Ref Range   Preg Test, Ur POSITIVE (A) NEGATIVE  Wet prep, genital     Status: None   Collection Time: 07/14/22  4:40 PM   Specimen: Vaginal  Result Value Ref Range   Yeast Wet Prep HPF POC NONE SEEN NONE SEEN   Trich, Wet Prep NONE SEEN NONE SEEN   Clue Cells Wet Prep HPF POC NONE SEEN NONE SEEN   WBC, Wet Prep HPF POC <10 <10   Sperm NONE SEEN   ABO/Rh     Status: None   Collection Time: 07/14/22  4:44 PM  Result Value Ref Range   ABO/RH(D) A POS    No rh immune globuloin      NOT A RH IMMUNE GLOBULIN CANDIDATE, PT RH POSITIVE Performed at [facility], [address], [place], [place] [address]   hCG, quantitative, pregnancy     Status: Abnormal   Collection Time: 07/14/22  4:44 PM  Result Value Ref Range   hCG, Beta Chain, Quant, S 1,645 (H) <5 mIU/mL  CBC     Status: None   Collection Time: 07/14/22  4:47 PM  Result Value Ref Range   WBC 9.5 4.0 - 10.5 K/uL   RBC 4.71 3.87 - 5.11 MIL/uL   Hemoglobin 13.6 12.0 - 15.0 g/dL   HCT 08.6 76.1 - 95.0 %   MCV 84.1 80.0 - 100.0 fL   MCH 28.9 26.0 - 34.0 pg   MCHC 34.3 30.0 - 36.0 g/dL   RDW 93.2 67.1 - 24.5 %   Platelets 270 150 - 400 K/uL   nRBC 0.0 0.0 - 0.2 %   TVUS ordered  US OB LESS THAN 14 WEEKS WITH OB TRANSVAGINAL  Result Date: 07/14/2022 CLINICAL DATA:  Bleeding for 1 day EXAM: OBSTETRIC <14 WK [place] AND TRANSVAGINAL OB US TECHNIQUE: Both transabdominal and transvaginal ultrasound examinations were performed for complete evaluation of the gestation as  well as the maternal uterus, adnexal regions, and pelvic cul-de-sac. Transvaginal technique was  performed to assess early pregnancy. COMPARISON:  None Available. FINDINGS: Intrauterine gestational sac: Not visualized Yolk sac:  Not visualized Embryo:  Not visualized Maternal uterus/adnexae: Heterogenous hyperechoic endometrial thickening. Ovaries are within normal limits. Left ovary measures 2.4 x 1.7 x 2.2 cm. Right ovary measures 2.6 x 1.8 x 3 cm. No significant free fluid. IMPRESSION: No IUP identified. Findings consistent with pregnancy of unknown location, differential of which includes IUP too early to visualize, recent failed pregnancy, and occult ectopic pregnancy. Recommend trending of HCG with repeat ultrasound as indicated Electronically Signed   By: Jasmine Pang M.D.   On: 07/14/2022 18:21      MDM - Pain improved with IM Toradol  - UA normal  - No signs of Anemia  - Wet Prep normal  - Quant 1645 - Suspected gestational sac Contents that were able to be expelled were sent to pathology for confirmation.  - Most likely Complete SAB. Patient hemodynamically stable, plan for discharge with strict return precautions.    Assessment and Plan   1. SAB (spontaneous abortion)   2. [redacted] weeks gestation of pregnancy   3. Vaginal bleeding   4. Language barrier    - Discussed that this is most likely a Spontaneous Abortion. With suspicion of gestational sac expelled, most likely a complete SAB.  - PO pain meds sent to outpatient pharmacy.  - Strict bleeding and return precautions reviewed. - Message sent to office for 2 week SAB follow up.  - Patient stable upon discharge and may return to MAU as needed.   Due to language barrier, an interpreter was present during the history-taking and subsequent discussion (and for part of the physical exam) with this patient. In-Person Interpreter Nettie Elm present for admission and discharge.   Claudette Head, MSN, CNM  07/14/2022, 4:11 PM

## 2022-07-15 LAB — GC/CHLAMYDIA PROBE AMP (~~LOC~~) NOT AT ARMC
Chlamydia: NEGATIVE
Comment: NEGATIVE
Comment: NORMAL
Neisseria Gonorrhea: NEGATIVE

## 2022-07-30 ENCOUNTER — Encounter: Payer: No Typology Code available for payment source | Admitting: Obstetrics and Gynecology

## 2022-08-20 NOTE — Congregational Nurse Program (Signed)
  Dept: [phone]   Congregational Nurse Program Note  Date of Encounter: 08/20/2022 BP (!) 153/94 (BP Location: Right Arm)   Pulse 79   LMP 05/09/2022  Pt to [facility] clinic c/o pain in lungs, describes pain, equal bilaterally, high in the trapezius area. Lungs sound clear in upper and lower lungs; pt states no fever or cough; feels a bit more tired than usual. She then described 2 very stressful life events that she has just experienced, including a loss of a pregnancy at 9 weeks. Enc pt to practice good self care and stress reduction, return for next clinic for follow up Jimmye Norman, RN, CCNP   Past Medical History: Past Medical History:  Diagnosis Date   Gestational diabetes    during pregnancy    Encounter Details:  CNP Questionnaire - 08/20/22 1556       Questionnaire   Do you give verbal consent to treat you today? Yes    Location Patient Served  [facility]    Visit Setting [place] or Organization    Patient Status Immigrant    Insurance Uninsured ([employer] Card/Care Connects/Self-Pay)    Production designer, theatre/television/film Assistance    Medication N/A    Medical Provider No    Screening Referrals N/A    Medical Referral N/A    Medical Appointment Made N/A    Food N/A    Transportation N/A    Housing/Utilities N/A    Interpersonal Safety N/A    Intervention Blood pressure;Advocate;Navigate Healthcare System;Counsel;Support    ED Visit Averted N/A    Life-Saving Intervention Made N/A

## 2022-09-03 NOTE — Congregational Nurse Program (Signed)
  Dept: [phone]   Congregational Nurse Program Note  Date of Encounter: 09/03/2022 BP 118/77 (BP Location: Left Arm)   Pulse 77   LMP 05/09/2022  Pt to [facility] community clinic c/o pain in various skin tags on left breast (no internal nodules noted upon exam) and in the gluteal crease. Referred to [facility], appointment made for 15 dec, 11:30. Instructions reviewed and written for patient.  Jimmye Norman, RN, CCNP   Past Medical History: Past Medical History:  Diagnosis Date   Gestational diabetes    during pregnancy    Encounter Details:  CNP Questionnaire - 09/03/22 1627       Questionnaire   Do you give verbal consent to treat you today? Yes    Location Patient Served  [facility]    Visit Setting [place] or Organization    Patient Status Immigrant    Insurance Uninsured ([employer] Card/Care Connects/Self-Pay)    Insurance Referral [facility] Card/Care Connects    Medication N/A    Medical Provider Yes    Screening Referrals N/A    Medical Referral [facility]    Medical Appointment Made [facility]    Food N/A    Transportation N/A    Housing/Utilities N/A    Interpersonal Safety N/A    Intervention Blood pressure;Advocate;Navigate Healthcare System;Case Management;Counsel;Support    ED Visit Averted N/A    Life-Saving Intervention Made N/A

## 2022-10-15 DIAGNOSIS — Z139 Encounter for screening, unspecified: Secondary | ICD-10-CM

## 2022-10-15 LAB — GLUCOSE, POCT (MANUAL RESULT ENTRY): POC Glucose: 132 mg/dl — AB (ref 70–99)

## 2022-10-15 NOTE — Congregational Nurse Program (Signed)
  Dept: [phone]   Congregational Nurse Program Note  Date of Encounter: 10/15/2022  Past Medical History: Past Medical History:  Diagnosis Date   Gestational diabetes    during pregnancy    Encounter Details:     Dept: [phone]   Congregational Nurse Program Note  Date of Encounter: 10/15/2022 BP 125/71 (BP Location: Left Arm)   Pulse 73   LMP 05/09/2022 Comment: pregnancy ended at 9 weeks, on 14 June 2022  Breastfeeding Unknown  Pt to community health clinic for bp check and cbg. Has appt already for [facility] on Dec. 15.  Jimmye Norman, RN, CCNP   Past Medical History: Past Medical History:  Diagnosis Date   Gestational diabetes    during pregnancy    Encounter Details:  CNP Questionnaire - 10/15/22 1535       Questionnaire   Ask client: Do you give verbal consent for me to treat you today? Yes    Student Assistance N/A    Location Patient Served  [facility]    Visit Setting with Client Organization    Patient Status Migrant    Insurance Uninsured [facility] Card/Care Connects/Self-Pay/Medicaid Family Planning)    Insurance/Financial Assistance Referral N/A    Medical Provider No    Medical Referrals Made N/A    Medical Appointment Made [facility] PCP/clinic    Recently w/o PCP, now 1st time PCP visit completed due to CNs referral or appointment made N/A    Food N/A    Transportation N/A    Housing/Utilities N/A    Interpersonal Safety N/A    Interventions Advocate/Support;Navigate Healthcare System;Educate    Screenings CN Performed Blood Pressure;Blood Glucose    Sent Client to Lab for: N/A    Did client attend any of the following based off CNs referral or appointments made? N/A    ED Visit Averted N/A    Life-Saving Intervention Made N/A

## 2022-12-05 ENCOUNTER — Ambulatory Visit: Payer: Self-pay | Admitting: Internal Medicine

## 2022-12-05 VITALS — BP 128/80 | HR 60 | Resp 12 | Ht 62.0 in | Wt 170.0 lb

## 2022-12-05 DIAGNOSIS — N12 Tubulo-interstitial nephritis, not specified as acute or chronic: Secondary | ICD-10-CM

## 2022-12-05 DIAGNOSIS — R300 Dysuria: Secondary | ICD-10-CM

## 2022-12-05 LAB — POCT URINALYSIS DIPSTICK
Bilirubin, UA: NEGATIVE
Glucose, UA: NEGATIVE
Ketones, UA: NEGATIVE
Leukocytes, UA: NEGATIVE
Nitrite, UA: NEGATIVE
Protein, UA: NEGATIVE
Spec Grav, UA: 1.015 (ref 1.010–1.025)
Urobilinogen, UA: 0.2 E.U./dL
pH, UA: 5 (ref 5.0–8.0)

## 2022-12-05 LAB — POCT URINE PREGNANCY: Preg Test, Ur: NEGATIVE

## 2022-12-05 MED ORDER — CIPROFLOXACIN HCL 500 MG PO TABS: ORAL_TABLET | ORAL | 0 refills | Status: DC

## 2022-12-05 NOTE — Progress Notes (Signed)
    Subjective:    Patient ID: Kimberly Avila, female   DOB: 1981-05-12, 41 y.o.   MRN: [id number]   HPI  Tildon Husky interprets   Worsening bilateral flank pain for 2 weeks:  More so on right side.  Feels like Like she is bruised  in flank areas like would feel from repetitively being hit there/bruised feeling.  States very similar to presentation in what appears to be February of this year when treated for pyelonephritis.  Unable to see if she was ultimately diagnosed with UTI then as no culture result.  No urinary frequency.  Does have suprapubic burning pain with urinating.  Suprapubic pain started before flank pain.   No fever.   She is having her period currently, started on 12/02/2022, 3 days ago.  The pain she is having is not consistent with her menstrual cramps. She did have vaginal discharge for about a week prior--thick and yellow without order and mild itching.  Can have white discharge before her periods normally.  No associated itching or odor.   She is married and sexually active--last intercourse was 3 weeks ago.    She does urinate after intercourse.  Husband without dysuria or penile discharge.   She is drinking lots of water.   She has not taken anything for the pain--multiple allergies for pain relievers.  She can tolerate Aleve or ASA.    No outpatient medications have been marked as taking for the 12/05/22 encounter (Office Visit) with Julieanne Manson, MD.   Allergies  Allergen Reactions  . Gelatin Itching  . Ibuprofen Rash  . Nyquil [Pseudoeph-Doxylamine-Dm-Apap] Itching  . Tylenol [Acetaminophen] Itching     Review of Systems    Objective:   BP 128/80 (BP Location: Left Arm, Patient Position: Sitting, Cuff Size: Normal)   Pulse 60   Resp 12   Ht 5\' 2"  (1.575 m)   Wt 170 lb (77.1 kg)   LMP 12/05/2022 (Exact Date)   BMI 31.09 kg/m   Physical Exam   Assessment & Plan    Probable bilateral pyelonephritis/cystitis as well:  Cipro 500 mg  twice daily for 7 days

## 2022-12-08 ENCOUNTER — Encounter: Payer: Self-pay | Admitting: Internal Medicine

## 2022-12-08 LAB — URINE CULTURE

## 2022-12-10 ENCOUNTER — Ambulatory Visit: Payer: Self-pay | Admitting: Internal Medicine

## 2022-12-10 VITALS — BP 120/80 | HR 80 | Resp 20 | Ht 62.0 in | Wt 171.0 lb

## 2022-12-10 DIAGNOSIS — N946 Dysmenorrhea, unspecified: Secondary | ICD-10-CM

## 2022-12-10 DIAGNOSIS — N12 Tubulo-interstitial nephritis, not specified as acute or chronic: Secondary | ICD-10-CM

## 2022-12-10 MED ORDER — CIPROFLOXACIN HCL 500 MG PO TABS: ORAL_TABLET | ORAL | 0 refills | Status: DC

## 2022-12-10 NOTE — Progress Notes (Signed)
SDOH Screenings   Food Insecurity: No Food Insecurity (12/10/2022)  Housing: Low Risk  (12/10/2022)  Transportation Needs: No Transportation Needs (12/10/2022)  Utilities: Not At Risk (12/10/2022)  Depression (PHQ2-9): Low Risk  (03/13/2022)  Tobacco Use: Low Risk  (12/08/2022)       12/10/2022    6:23 PM 03/13/2022   10:44 AM 02/27/2022   10:49 AM 02/26/2022    2:56 PM  GAD 7 : Generalized Anxiety Score  Nervous, Anxious, on Edge 0 0 0 0  Control/stop worrying 0 0 0 0  Worry too much - different things 0 0 0 1  Trouble relaxing 0 0 0 0  Restless 0 0 0 0  Easily annoyed or irritable 0 0 0 0  Afraid - awful might happen 0 0 0 0  Total GAD 7 Score 0 0 0 1  Anxiety Difficulty Not difficult at all Not difficult at all Not difficult at all Not difficult at all        12/10/2022    6:22 PM 03/13/2022   10:44 AM 02/27/2022   10:49 AM  PHQ9 SCORE ONLY  PHQ-9 Total Score 0 0 0    Patient has no needs.  Patient was informed to let us know if that should change. Dr. Delrae Alfred was informed.

## 2022-12-10 NOTE — Progress Notes (Signed)
    Subjective:    Patient ID: Kimberly Avila, female   DOB: [birth year], 41 y.o.   MRN: [id number]   HPI  Here for follow up of what was felt to be pyelonephritis:  She was placed on Cipro 500 mg twice daily for 7 days.   Her urine culture only grew mixed colonies with low count. She is feeling better, her back pain is only a bit better.   She still has a bit of suprapubic pain as well, but is also better.  She admits her symptoms significantly improved in actuality after passing a large clot with her period.   Current Meds  Medication Sig   ciprofloxacin (CIPRO) 500 MG tablet 1 tab by mouth twice daily for 7 days.   Allergies  Allergen Reactions   Gelatin Itching   Ibuprofen Rash   Nyquil [Pseudoeph-Doxylamine-Dm-Apap] Itching   Tylenol [Acetaminophen] Itching     Review of Systems    Objective:   BP 120/80 (BP Location: Left Arm, Patient Position: Sitting, Cuff Size: Normal)   Pulse 80   Resp 20   Ht 5\' 2"  (1.575 m)   Wt 171 lb (77.6 kg)   LMP 12/05/2022 (Exact Date)   BMI 31.28 kg/m   Physical Exam Looks good Lungs:  CTA CV: RRR without murmur or rub.  Radial pulses normal and equal Abd:  S, + BS, No HSM or mass, mild suprapubic tenderness.  No definite flank tenderness.      Assessment & Plan  Suprapubic tenderness and mild dysuria Extending treatment with Cipro for 3 more days Call if symptoms do not completely resolve

## 2023-02-04 DIAGNOSIS — Z139 Encounter for screening, unspecified: Secondary | ICD-10-CM

## 2023-02-04 LAB — GLUCOSE, POCT (MANUAL RESULT ENTRY)
POC Glucose: 147 mg/dl — AB (ref 70–99)
POC Glucose: 147 mg/dl — AB (ref 70–99)

## 2023-02-04 NOTE — Congregational Nurse Program (Signed)
  Dept: [phone]   Congregational Nurse Program Note  Date of Encounter: 02/25/23 BP 124/74 (BP Location: Left Arm)   Pulse 74   LMP 05/09/2022  Estanislado Pandy, RN, CCNP   Past Medical History: Past Medical History:  Diagnosis Date   Gestational diabetes    during pregnancy    Encounter Details:  CNP Questionnaire - 02/25/23 1707       Questionnaire   Ask client: Do you give verbal consent for me to treat you today? Yes    Student Assistance N/A    Location Patient [place]    Visit Setting with Client Organization    Patient Status Migrant    Insurance Unknown    Insurance/Financial Assistance Referral N/A    Medical Provider Yes    Screening Referrals Made N/A    Medical Referrals Made N/A    Medical Appointment Made N/A    Recently w/o PCP, now 1st time PCP visit completed due to CNs referral or appointment made N/A    Food N/A    Transportation N/A    Housing/Utilities N/A    Interpersonal Safety N/A    Interventions Advocate/Support;Educate;Navigate Healthcare System    Abnormal to Normal Screening Since Last CN Visit N/A    Screenings CN Performed Blood Pressure;Blood Glucose    Sent Client to Lab for: N/A    Did client attend any of the following based off CNs referral or appointments made? N/A    ED Visit Averted N/A    Life-Saving Intervention Made N/A               Dept: [phone]   Congregational Nurse Program Note  Date of Encounter: 02/04/2023 BP 130/81 (BP Location: Left Arm)   Pulse 80   LMP 05/09/2022  CBG 147 Counsel pt on elevated cbg, recommend she return to [facility] clinic next week to monitor. Pt has f/u at [facility] on April 19.  Estanislado Pandy, RN, CCNP  Past Medical History: Past Medical History:  Diagnosis Date   Gestational diabetes    during pregnancy    Encounter Details:

## 2023-02-25 LAB — GLUCOSE, POCT (MANUAL RESULT ENTRY): POC Glucose: 108 mg/dl — AB (ref 70–99)

## 2023-03-10 ENCOUNTER — Other Ambulatory Visit: Payer: Self-pay | Admitting: Internal Medicine

## 2023-03-10 ENCOUNTER — Encounter: Payer: Self-pay | Admitting: Internal Medicine

## 2023-03-10 ENCOUNTER — Ambulatory Visit: Payer: Self-pay | Admitting: Internal Medicine

## 2023-03-10 VITALS — BP 132/88 | HR 70 | Ht 62.0 in | Wt 170.0 lb

## 2023-03-10 DIAGNOSIS — G8929 Other chronic pain: Secondary | ICD-10-CM

## 2023-03-10 DIAGNOSIS — Z124 Encounter for screening for malignant neoplasm of cervix: Secondary | ICD-10-CM

## 2023-03-10 DIAGNOSIS — Z1159 Encounter for screening for other viral diseases: Secondary | ICD-10-CM

## 2023-03-10 DIAGNOSIS — Z131 Encounter for screening for diabetes mellitus: Secondary | ICD-10-CM

## 2023-03-10 DIAGNOSIS — Z0000 Encounter for general adult medical examination without abnormal findings: Secondary | ICD-10-CM

## 2023-03-10 DIAGNOSIS — N6321 Unspecified lump in the left breast, upper outer quadrant: Secondary | ICD-10-CM

## 2023-03-10 DIAGNOSIS — Z6830 Body mass index (BMI) 30.0-30.9, adult: Secondary | ICD-10-CM

## 2023-03-10 DIAGNOSIS — N920 Excessive and frequent menstruation with regular cycle: Secondary | ICD-10-CM

## 2023-03-10 DIAGNOSIS — M546 Pain in thoracic spine: Secondary | ICD-10-CM

## 2023-03-10 DIAGNOSIS — E6609 Other obesity due to excess calories: Secondary | ICD-10-CM

## 2023-03-10 LAB — POCT WET PREP WITH KOH
KOH Prep POC: NEGATIVE
RBC Wet Prep HPF POC: NEGATIVE
Trichomonas, UA: NEGATIVE
Yeast Wet Prep HPF POC: NEGATIVE

## 2023-03-10 MED ORDER — FERROUS GLUCONATE 324 (38 FE) MG PO TABS: ORAL_TABLET | ORAL | 3 refills | Status: DC

## 2023-03-10 NOTE — Progress Notes (Signed)
Subjective:    Patient ID: Kimberly Avila, female   DOB: February 19, 1981, 42 y.o.   MRN: [id number]   HPI  Isidore Moos interprets  CPE with pap  1.  Pap: Last in 2020 and normal (actually, sound like she had another pap in 2021 with PHD when having Nexplanon removed from left arm.  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  Colposcopy and biopsy showed chronic inflammation only.  Not clear she had follow up for some time afterward as she had a pregnancy thereafter.    2.  Mammogram:  performed in 2016 for nipple discharge and left breast pain:  normal.  States now has noted a mass in her left lateral breast for past 9 months.  Has not enlarged.  No pain.  No family history of breast cancer.    3.  Osteoprevention:  Drinks 1 cup milk daily.  Willing to drink 3 cups 2% milk daily.  She is walking daily now that daylight later in day.  40 minutes.  Discussed alternate options during other times of year.    4.  Guaiac Cards/FIT:  Never.    5.  Colonoscopy:  Never.  No family history of colon cancer.    6.  Immunizations:  Did not get influenza vaccine this past fall.  Has not received any COVID vaccines.  She refuses COVID vaccines.  Unable to clarify why.   Immunization History  Administered Date(s) Administered   Influenza Split 09/15/2012   Td 02/18/2007   Tdap 11/29/2012, 01/02/2014     7.  Glucose/Cholesterol:  Elevated blood glucose in past and GDM, but A1C has been in normal range in past 4 years.  Cholesterol not performed in this chart since 2011 when was quite good.  She has never been told her cholesterol was high elsewhere.  No outpatient medications have been marked as taking for the 03/10/23 encounter (Office Visit) with Mack Hook, MD.   Allergies  Allergen Reactions   Gelatin Itching   Ibuprofen Rash   Nyquil [Pseudoeph-Doxylamine-Dm-Apap] Itching   Tylenol [Acetaminophen] Itching    Past Medical History:  Diagnosis Date   Gestational diabetes     during pregnancy   Past Surgical History:  Procedure Laterality Date   CHOLECYSTECTOMY N/A 06/25/2019   Procedure: LAPAROSCOPIC CHOLECYSTECTOMY;  Surgeon: Rolm Bookbinder, MD;  Location: [place];  Service: General;  Laterality: N/A;   Family History  Problem Relation Age of Onset   Other Mother        COVID 2021   Other Father        COVID 2021   Hypertension Father    Diabetes Maternal Uncle    Social History   Socioeconomic History   Marital status: Married    Spouse name: Langley Adie   Number of children: 6   Years of education: Not on file   Highest education level: 6th grade  Occupational History   Occupation: Housewife  Tobacco Use   Smoking status: Never    Passive exposure: Never   Smokeless tobacco: Never  Vaping Use   Vaping Use: Never used  Substance and Sexual Activity   Alcohol use: No   Drug use: No   Sexual activity: Yes    Birth control/protection: None, Condom  Other Topics Concern   Not on file  Social History Narrative   Lives at home with husband and 4 children.   2 oldest children married and live in [place].   Social Determinants of Health  Financial Resource Strain: Low Risk  (03/10/2023)   Overall Financial Resource Strain (CARDIA)    Difficulty of Paying Living Expenses: Not hard at all  Food Insecurity: No Food Insecurity (03/10/2023)   Hunger Vital Sign    Worried About Running Out of Food in the Last Year: Never true    Ran Out of Food in the Last Year: Never true  Transportation Needs: No Transportation Needs (03/10/2023)   PRAPARE - Hydrologist (Medical): No    Lack of Transportation (Non-Medical): No  Physical Activity: Not on file  Stress: Not on file  Social Connections: Not on file  Intimate Partner Violence: Not At Risk (03/10/2023)   Humiliation, Afraid, Rape, and Kick questionnaire    Fear of Current or Ex-Partner: No    Emotionally Abused: No    Physically Abused: No    Sexually  Abused: No      Review of Systems  Genitourinary:        Periods heavy since SAB in 2023 at 9 weeks.   Periods regular.   Flow is about 4-5 days.   + Clotting, can be large at times. Does not take iron when flow is heavy.  Musculoskeletal:        Pain in upper posterior thoracic back that goes up and over her shoulder bilaterally, more so on left.   Occurs 1-2 times weekly.   Lasts about 20 minutes. Feels like someone has punched her.   Activities like raking leaves triggers. Can have, however, at rest.   Has been a problem for 2 months.   Never awakens from sleep. Something like Ephraim Hamburger helps. She is allergic to both ibuprofen and Tylenol.         Objective:   BP 132/88 (BP Location: Left Arm, Patient Position: Sitting, Cuff Size: Normal)   Pulse 70   Ht 5\' 2"  (1.575 m)   Wt 170 lb (77.1 kg)   LMP 05/09/2022   BMI 31.09 kg/m   Physical Exam Constitutional:      Appearance: She is obese.  HENT:     Head: Normocephalic and atraumatic.     Right Ear: Tympanic membrane, ear canal and external ear normal.     Left Ear: Tympanic membrane, ear canal and external ear normal.     Nose: Nose normal.     Mouth/Throat:     Mouth: Mucous membranes are moist.     Pharynx: Oropharynx is clear.  Eyes:     Extraocular Movements: Extraocular movements intact.     Conjunctiva/sclera: Conjunctivae normal.     Pupils: Pupils are equal, round, and reactive to light.     Comments: Discs sharp  Neck:     Thyroid: No thyroid mass or thyromegaly.  Cardiovascular:     Rate and Rhythm: Normal rate and regular rhythm.     Heart sounds: S1 normal and S2 normal. No murmur heard.    No friction rub. No S3 or S4 sounds.     Comments: Carotid, radial, femoral, DP and PT pulses normal and equal.   Pulmonary:     Effort: Pulmonary effort is normal.     Breath sounds: Normal breath sounds and air entry.  Chest:  Breasts:    Right: No inverted nipple, mass or nipple discharge.      Left: No inverted nipple, mass or nipple discharge.       Comments: Perhaps small BB sized lesions along collar of underside of lateral left  breast.   Abdominal:     General: Bowel sounds are normal.     Palpations: Abdomen is soft. There is no hepatomegaly, splenomegaly or mass.     Tenderness: There is no abdominal tenderness.     Hernia: No hernia is present.       Comments: Hypertrophic scars where laparoscopic incisions made in past.  Genitourinary:    Comments: Normal external female genitalia No uterine or adnexal mass.  Mild LLQ tenderness on palpation of left adnexa.   Cervix multiparous with patulous endocervical opening.  Mild light yellow mucous from os.   Musculoskeletal:        General: Normal range of motion.     Cervical back: Normal range of motion and neck supple.     Thoracic back: Tenderness (Traps bilaterally, L>>R.) present.     Right lower leg: No edema.     Left lower leg: No edema.  Lymphadenopathy:     Head:     Right side of head: No submental or submandibular adenopathy.     Left side of head: No submental or submandibular adenopathy.     Cervical: No cervical adenopathy.     Upper Body:     Right upper body: No supraclavicular or axillary adenopathy.     Left upper body: No supraclavicular or axillary adenopathy.     Lower Body: No right inguinal adenopathy. No left inguinal adenopathy.  Skin:    General: Skin is warm.     Capillary Refill: Capillary refill takes less than 2 seconds.     Findings: No rash.  Neurological:     General: No focal deficit present.     Mental Status: She is alert and oriented to person, place, and time.     Cranial Nerves: Cranial nerves 2-12 are intact.     Sensory: Sensation is intact.     Motor: Motor function is intact.     Coordination: Coordination is intact.     Gait: Gait is intact.     Deep Tendon Reflexes: Reflexes are normal and symmetric.  Psychiatric:        Speech: Speech normal.        Behavior:  Behavior normal. Behavior is cooperative.      Assessment & Plan    CPE with pap Diagnostic mammogram, though do not palpate anything abnormal--suspect normal tissue in area of concern at collar of inferior left breast. Encouraged her to rethink COVID vaccination and to get yearly influenza vaccine, particularly since she lost both parents just days apart to [place].   FIT to return in 2 weeks (42 next month) CBC, CMP, A1C, FLP, screen for Hep C  2.  Menorrhagia:  CBC.  Ferrous gluconate 324 mg daily during period.    3.  Upper back/trap pain:  referral to [facility].

## 2023-03-11 LAB — CBC WITH DIFFERENTIAL/PLATELET
Basophils Absolute: 0.1 10*3/uL (ref 0.0–0.2)
Basos: 1 %
EOS (ABSOLUTE): 0.2 10*3/uL (ref 0.0–0.4)
Eos: 2 %
Hematocrit: 38.9 % (ref 34.0–46.6)
Hemoglobin: 12.6 g/dL (ref 11.1–15.9)
Immature Grans (Abs): 0 10*3/uL (ref 0.0–0.1)
Immature Granulocytes: 0 %
Lymphocytes Absolute: 2.4 10*3/uL (ref 0.7–3.1)
Lymphs: 31 %
MCH: 26.9 pg (ref 26.6–33.0)
MCHC: 32.4 g/dL (ref 31.5–35.7)
MCV: 83 fL (ref 79–97)
Monocytes Absolute: 0.6 10*3/uL (ref 0.1–0.9)
Monocytes: 7 %
Neutrophils Absolute: 4.5 10*3/uL (ref 1.4–7.0)
Neutrophils: 59 %
Platelets: 296 10*3/uL (ref 150–450)
RBC: 4.68 x10E6/uL (ref 3.77–5.28)
RDW: 13.3 % (ref 11.7–15.4)
WBC: 7.7 10*3/uL (ref 3.4–10.8)

## 2023-03-11 LAB — COMPREHENSIVE METABOLIC PANEL
ALT: 22 IU/L (ref 0–32)
AST: 17 IU/L (ref 0–40)
Albumin/Globulin Ratio: 1.4 (ref 1.2–2.2)
Albumin: 4.1 g/dL (ref 3.9–4.9)
Alkaline Phosphatase: 78 IU/L (ref 44–121)
BUN/Creatinine Ratio: 17 (ref 9–23)
BUN: 11 mg/dL (ref 6–20)
Bilirubin Total: 0.9 mg/dL (ref 0.0–1.2)
CO2: 21 mmol/L (ref 20–29)
Calcium: 9 mg/dL (ref 8.7–10.2)
Chloride: 105 mmol/L (ref 96–106)
Creatinine, Ser: 0.64 mg/dL (ref 0.57–1.00)
Globulin, Total: 3 g/dL (ref 1.5–4.5)
Glucose: 92 mg/dL (ref 70–99)
Potassium: 3.9 mmol/L (ref 3.5–5.2)
Sodium: 140 mmol/L (ref 134–144)
Total Protein: 7.1 g/dL (ref 6.0–8.5)
eGFR: 115 mL/min/{1.73_m2} (ref >59)

## 2023-03-11 LAB — LIPID PANEL W/O CHOL/HDL RATIO
Cholesterol, Total: 134 mg/dL (ref 100–199)
HDL: 42 mg/dL (ref >39)
LDL Chol Calc (NIH): 78 mg/dL (ref 0–99)
Triglycerides: 67 mg/dL (ref 0–149)
VLDL Cholesterol Cal: 14 mg/dL (ref 5–40)

## 2023-03-11 LAB — CYTOLOGY - PAP

## 2023-03-11 LAB — HEPATITIS C ANTIBODY: Hep C Virus Ab: NONREACTIVE

## 2023-03-11 LAB — HGB A1C W/O EAG: Hgb A1c MFr Bld: 5.7 % — ABNORMAL HIGH (ref 4.8–5.6)

## 2023-03-26 ENCOUNTER — Telehealth: Payer: Self-pay

## 2023-03-26 NOTE — Telephone Encounter (Signed)
Telephoned patient at mobile number using language line interpreter# [id number]. Left a voice message with [facility] contact information.

## 2023-04-08 NOTE — Congregational Nurse Program (Signed)
  Dept: [phone]   Congregational Nurse Program Note  Date of Encounter: 11/26/2022 Jimmye Norman, RN, CCNP   Past Medical History: Past Medical History:  Diagnosis Date   Gestational diabetes    during pregnancy    Encounter Details:

## 2023-05-13 DIAGNOSIS — Z139 Encounter for screening, unspecified: Secondary | ICD-10-CM

## 2023-05-13 LAB — GLUCOSE, POCT (MANUAL RESULT ENTRY): POC Glucose: 120 mg/dl — AB (ref 70–99)

## 2023-05-13 NOTE — Congregational Nurse Program (Signed)
  Dept: [phone]   Congregational Nurse Program Note  Date of Encounter: 05/13/2023 BP 128/80   Pulse 69  CBG 120 Discuss options of dental care with pt at her request.  Jimmye Norman, RN, CCNP   Past Medical History: Past Medical History:  Diagnosis Date   Gestational diabetes    during pregnancy    Encounter Details:  CNP Questionnaire - 05/13/23 1525       Questionnaire   Ask client: Do you give verbal consent for me to treat you today? Yes    Student Assistance N/A    Location Patient Served  [facility]    Visit Setting with Client Organization    Patient Status Migrant    Insurance Uninsured ([place] Card/Care Connects/Self-Pay/Medicaid Family Planning)    Insurance/Financial Assistance Referral N/A    Medication N/A    Medical Provider Yes    Screening Referrals Made Dental;Annual Wellness Visit    Medical Referrals Made Dental    Medical Appointment Made N/A    Recently w/o PCP, now 1st time PCP visit completed due to CNs referral or appointment made N/A    Food N/A    Transportation N/A    Housing/Utilities N/A    Interpersonal Safety N/A    Interventions Advocate/Support;Navigate Healthcare System;Educate    Abnormal to Normal Screening Since Last CN Visit N/A    Screenings CN Performed Blood Pressure;Blood Glucose    Sent Client to Lab for: N/A    Did client attend any of the following based off CNs referral or appointments made? N/A    ED Visit Averted N/A    Life-Saving Intervention Made N/A

## 2023-05-20 DIAGNOSIS — Z139 Encounter for screening, unspecified: Secondary | ICD-10-CM

## 2023-05-20 LAB — GLUCOSE, POCT (MANUAL RESULT ENTRY): POC Glucose: 103 mg/dl — AB (ref 70–99)

## 2023-05-20 NOTE — Congregational Nurse Program (Signed)
  Dept: [phone]   Congregational Nurse Program Note  Date of Encounter: 05/20/2023 BP 116/70 (BP Location: Left Arm)   Pulse 73  Cbg 103 Jimmye Norman, RN, [employer]   Past Medical History: Past Medical History:  Diagnosis Date   Gestational diabetes    during pregnancy    Encounter Details:  CNP Questionnaire - 05/20/23 1548       Questionnaire   Ask client: Do you give verbal consent for me to treat you today? Yes    Student Assistance N/A    Location Patient Served  [facility]    Visit Setting with Client Organization    Patient Status Migrant    Insurance Uninsured ([place] Card/Care Connects/Self-Pay/Medicaid Family Planning)    Insurance/Financial Assistance Referral N/A    Medication N/A    Medical Provider No    Screening Referrals Made N/A    Medical Referrals Made N/A    Medical Appointment Made N/A    Recently w/o PCP, now 1st time PCP visit completed due to CNs referral or appointment made N/A    Food N/A    Transportation N/A    Housing/Utilities N/A    Interpersonal Safety N/A    Interventions Advocate/Support;Navigate Healthcare System;Educate    Abnormal to Normal Screening Since Last CN Visit N/A    Screenings CN Performed Blood Pressure;Blood Glucose    Sent Client to Lab for: N/A    Did client attend any of the following based off CNs referral or appointments made? N/A    ED Visit Averted N/A    Life-Saving Intervention Made N/A

## 2023-07-22 LAB — BASIC METABOLIC PANEL: Glucose: 151

## 2023-07-22 NOTE — Congregational Nurse Program (Signed)
  Dept: [phone]   Congregational Nurse Program Note  Date of Encounter: 07/22/2023  BP 114/78   Pulse 69  CBG 151 Pt has been told she is prediabetic. Pt has h/o gest. Diabetes. Pt states she just ate prior to coming to see me in the [facility] clinic. Reviewed nutrition and exercise lifestyle that can be protective for pts with prediabetes. Pt also states that she is having cramping during her periods which extend to the lower back over the kidneys.  Jimmye Norman, RN, CCNP  Past Medical History: Past Medical History:  Diagnosis Date   Gestational diabetes    during pregnancy    Encounter Details:  CNP Questionnaire - 07/22/23 1618       Questionnaire   Ask client: Do you give verbal consent for me to treat you today? Yes    Student Assistance N/A    Location Patient Served  [facility]    Visit Setting with Client Organization    Patient Status Migrant    Insurance Uninsured ([place] Card/Care Connects/Self-Pay/Medicaid Family Planning)    Insurance/Financial Assistance Referral N/A    Medication N/A    Medical Provider Yes    Screening Referrals Made N/A    Medical Referrals Made N/A    Medical Appointment Made N/A    Recently w/o PCP, now 1st time PCP visit completed due to CNs referral or appointment made Yes    Food N/A    Transportation N/A    Housing/Utilities N/A    Interpersonal Safety N/A    Interventions Advocate/Support;Navigate Healthcare System;Educate    Abnormal to Normal Screening Since Last CN Visit N/A    Screenings CN Performed Blood Pressure;Blood Glucose    Sent Client to Lab for: N/A    Did client attend any of the following based off CNs referral or appointments made? Insurance   Pt has obtained orange card since our last visit   ED Visit Averted N/A    Life-Saving Intervention Made N/A

## 2023-07-27 ENCOUNTER — Telehealth: Payer: Self-pay

## 2023-07-28 NOTE — Telephone Encounter (Signed)
Patient needed an appointment , patient has been scheduled.

## 2023-08-18 ENCOUNTER — Other Ambulatory Visit: Payer: Self-pay | Admitting: Internal Medicine

## 2023-08-18 ENCOUNTER — Ambulatory Visit: Payer: Self-pay | Admitting: Internal Medicine

## 2023-08-18 ENCOUNTER — Encounter: Payer: Self-pay | Admitting: Internal Medicine

## 2023-08-18 VITALS — BP 122/80 | HR 64 | Resp 16 | Ht 62.0 in | Wt 163.0 lb

## 2023-08-18 DIAGNOSIS — N939 Abnormal uterine and vaginal bleeding, unspecified: Secondary | ICD-10-CM

## 2023-08-18 DIAGNOSIS — N920 Excessive and frequent menstruation with regular cycle: Secondary | ICD-10-CM

## 2023-08-18 DIAGNOSIS — N941 Unspecified dyspareunia: Secondary | ICD-10-CM

## 2023-08-18 DIAGNOSIS — N6321 Unspecified lump in the left breast, upper outer quadrant: Secondary | ICD-10-CM

## 2023-08-18 DIAGNOSIS — N898 Other specified noninflammatory disorders of vagina: Secondary | ICD-10-CM

## 2023-08-18 DIAGNOSIS — N889 Noninflammatory disorder of cervix uteri, unspecified: Secondary | ICD-10-CM

## 2023-08-18 LAB — POCT WET PREP WITH KOH
Clue Cells Wet Prep HPF POC: NEGATIVE
KOH Prep POC: NEGATIVE
RBC Wet Prep HPF POC: NEGATIVE
Trichomonas, UA: NEGATIVE
Yeast Wet Prep HPF POC: NEGATIVE

## 2023-08-18 NOTE — Progress Notes (Signed)
Subjective:    Patient ID: Kimberly Avila, female   DOB: 14-Nov-1981, 42 y.o.   MRN: [id number]   HPI  Tereasa Coop interprets   Left breast mass:  She has not gone in for mammogram ordered in March.  She states her phone broke at the time and was likely being called for the appt.  Discussed need to notify the office in future if problem with communication.    2.  Vaginal bleeding with intercourse.  States a fair amount, but not able to quantify.  No associated pain with intercourse.  Notes a bit of bleeding when she urinates subsequently.  She can have discomfort in vaginal and bilateral anterior pelvic area house later.   No vaginal discharge. Last intercourse August 3rd.   She also continues to have heavy periods.   Started after SAB in 06/2022 at [redacted] weeks gestation.   Ultrasound was performed after SAB to be certain all products of conception gone and patient states there was no tissue left.   She stops taking iron supplementation when her last bottle of iron ran out.    3.  HM:  never returned FIT.  Left in hot truck for many weeks to months.  No outpatient medications have been marked as taking for the 08/18/23 encounter (Office Visit) with Julieanne Manson, MD.   Allergies  Allergen Reactions   Gelatin Itching   Ibuprofen Rash   Nyquil [Pseudoeph-Doxylamine-Dm-Apap] Itching   Tylenol [Acetaminophen] Itching     Review of Systems  Genitourinary:  Positive for dyspareunia (Pain actually hours after intercourse.).      Objective:   BP 122/80 (BP Location: Left Arm, Patient Position: Sitting, Cuff Size: Normal)   Pulse 64   Resp 16   Ht 5\' 2"  (1.575 m)   Wt 163 lb (73.9 kg)   LMP 07/28/2023 (Exact Date)   BMI 29.81 kg/m   Physical Exam Cardiovascular:     Rate and Rhythm: Normal rate and regular rhythm.     Pulses: Normal pulses.     Heart sounds: Normal heart sounds.  Pulmonary:     Effort: Pulmonary effort is normal.     Breath sounds: Normal  breath sounds and air entry.  Abdominal:     General: Bowel sounds are normal.     Palpations: Abdomen is soft. There is no hepatomegaly or splenomegaly.     Tenderness: There is no abdominal tenderness.  Genitourinary:       Comments: Very vascular, friable area of inferior endocervix.  Tender during bimanual with pressure directly over this area and very tender with spatula and brush passing over area.   Scant yellow mucousy vaginal discharge.  No other cervical or vaginal mucosa lesion.   Uterine fundus difficult to find as seem retroverted.  No adnexal mass or tenderness.   Musculoskeletal:     Right lower leg: No edema.     Left lower leg: No edema.      Assessment & Plan   Left breast mass:  sending back for mammogram and she is to call if she does not hear anything in next 2 weeks. Reiterated notifying us in future if phone not functional.  2.  Vaginal bleeding following intercourse:  has a friable lesion at inferior endocervix that is the likely culprit.  Pap taken.  Also sending for pelvic ultrasound with continued issues with menorrhagia.   Referral to gyn for cervical lesion--not clear if ectropion or concerning lesion.  3.  Vaginal discharge:  wet prep, GC/chlamydia, RPR, HIV.

## 2023-08-19 LAB — RPR QUALITATIVE: RPR Ser Ql: NONREACTIVE

## 2023-08-19 LAB — BASIC METABOLIC PANEL: Glucose: 167

## 2023-08-19 LAB — HIV ANTIBODY (ROUTINE TESTING W REFLEX): HIV Screen 4th Generation wRfx: NONREACTIVE

## 2023-08-19 LAB — CYTOLOGY - PAP

## 2023-08-19 NOTE — Congregational Nurse Program (Signed)
  Dept: [phone]   Congregational Nurse Program Note  Date of Encounter: 08/19/2023 BP 133/76 (BP Location: Left Arm)   LMP 07/28/2023 (Exact Date)  Pt here to discuss her appointment at [facility] yesterday; pt is anxious that she has a sore or ulcer on her uterus. Pt states she is awaiting a phone call for follow up plan. Offered support to pt; inst pt to come to clinic in 3 weeks if she still needs assistance with appointments. (This clinic will be closed for the next 2 weeks).  Jimmye Norman, RN, CCNP  Past Medical History: Past Medical History:  Diagnosis Date   Gestational diabetes    during pregnancy    Encounter Details:  CNP Questionnaire - 08/19/23 1526       Questionnaire   Ask client: Do you give verbal consent for me to treat you today? Yes    Student Assistance N/A    Location Patient Served  [facility]    Visit Setting with Client Organization    Patient Status Migrant    Insurance Uninsured ([place] Card/Care Connects/Self-Pay/Medicaid Family Planning)    Insurance/Financial Assistance Referral N/A    Medication N/A    Medical Provider Yes    Screening Referrals Made N/A    Medical Referrals Made N/A    Medical Appointment Made N/A    Recently w/o PCP, now 1st time PCP visit completed due to CNs referral or appointment made N/A    Food N/A    Transportation N/A    Housing/Utilities N/A    Interpersonal Safety N/A    Interventions Advocate/Support;Counsel    Abnormal to Normal Screening Since Last CN Visit N/A    Screenings CN Performed Blood Pressure;Blood Glucose    Sent Client to Lab for: N/A    Did client attend any of the following based off CNs referral or appointments made? N/A    ED Visit Averted N/A    Life-Saving Intervention Made N/A

## 2023-08-20 LAB — GC/CHLAMYDIA PROBE AMP
Chlamydia trachomatis, NAA: NEGATIVE
Neisseria Gonorrhoeae by PCR: NEGATIVE

## 2023-08-21 ENCOUNTER — Other Ambulatory Visit: Payer: Self-pay

## 2023-08-21 DIAGNOSIS — Z1211 Encounter for screening for malignant neoplasm of colon: Secondary | ICD-10-CM

## 2023-08-21 LAB — POC FIT TEST STOOL: Fecal Occult Blood: NEGATIVE

## 2023-09-03 ENCOUNTER — Other Ambulatory Visit: Payer: Self-pay

## 2023-09-03 ENCOUNTER — Telehealth: Payer: Self-pay

## 2023-09-03 DIAGNOSIS — N6321 Unspecified lump in the left breast, upper outer quadrant: Secondary | ICD-10-CM

## 2023-09-03 NOTE — Telephone Encounter (Signed)
Telephoned patient using Delorise Royals (interpreter). Left voice message with [facility] contact information.

## 2023-09-16 ENCOUNTER — Telehealth: Payer: Self-pay

## 2023-09-16 NOTE — Telephone Encounter (Signed)
Patient had an appointment for vaginal bleeding in December 26 which was rescheduled due to office been closed appointment was rescheduled to 01/20/23. Patient would like appointment to be sooner because Patient had her September period that was heavy and made patient think it was a hemorrhage , for like 2 days . Period has been normal in the past.  Patient is available any day and any time.   We will call patient if there is a cancellation.

## 2023-09-17 ENCOUNTER — Ambulatory Visit: Payer: Self-pay | Admitting: Hematology and Oncology

## 2023-09-17 DIAGNOSIS — N6320 Unspecified lump in the left breast, unspecified quadrant: Secondary | ICD-10-CM

## 2023-09-17 NOTE — Progress Notes (Signed)
Ms. Kimberly Avila is a 42 y.o. female who presents to [facility] clinic today with complaint of left breast mass.    Pap Smear: Pap not smear completed today. Last Pap smear was 08/18/2023 and was normal. Per patient has no history of an abnormal Pap smear. Last Pap smear result is available in Epic.   Physical exam: Breasts Breasts symmetrical. No skin abnormalities bilateral breasts. No nipple retraction bilateral breasts. No nipple discharge bilateral breasts. No lymphadenopathy. No lumps palpated bilateral breasts.        Pelvic/Bimanual Pap is not indicated today    Smoking History: Patient has never smoked and was not referred to quit line.    Patient Navigation: Patient education provided. Access to services provided for patient through [facility] program. Kimberly Avila interpreter provided. No transportation provided   Colorectal Cancer Screening: Per patient has never had colonoscopy completed No complaints today.    Breast and Cervical Cancer Risk Assessment: Patient does not have family history of breast cancer, known genetic mutations, or radiation treatment to the chest before age 57. Patient does not have history of cervical dysplasia, immunocompromised, or DES exposure in-utero.  Risk Scores as of Encounter on 09/17/2023     Kimberly Avila           5-year 0.4%   Lifetime 7.33%   This patient is [place]/Lat[place] but has no documented birth country, so the [place] model used data from [place] patients to calculate their risk score. Document a birth country in the Demographics activity for a more accurate score.         Last calculated by Kimberly Avila, CMA on 09/17/2023 at  2:19 PM        A: [facility] exam without pap smear Complaint of left breast lump as noted on exam.   P: Referred patient to the [facility] for a diagnostic mammogram. Appointment scheduled 09/17/23.  Kimberly Avila A, NP 09/17/2023 2:25 PM

## 2023-09-18 NOTE — Patient Instructions (Signed)
Taught Kimberly Avila about self breast awareness and gave educational materials to take home. Patient did not need a Pap smear today due to last Pap smear was in 08/18/2023 per patient. Told patient about free cervical cancer screenings to receive a Pap smear if would like one next year. Let her know [facility] will cover Pap smears every 5 years unless has a history of abnormal Pap smears. Referred patient to the [facility] for diagnostic mammogram. Appointment scheduled for 09/17/23. Patient aware of appointment and will be there. Let patient know will follow up with her within the next couple weeks with results. Kimberly Avila verbalized understanding.  Pascal Lux, NP 8:57 AM

## 2023-09-23 LAB — BASIC METABOLIC PANEL: Glucose: 94

## 2023-09-23 NOTE — Congregational Nurse Program (Signed)
  Dept: [phone]   Congregational Nurse Program Note  Date of Encounter: 09/23/2023 BP 130/77 (BP Location: Left Arm)   LMP 08/23/2023 (Exact Date)  Pt doing well. Kimberly Norman, RN, CCNP  Past Medical History: Past Medical History:  Diagnosis Date   Gestational diabetes    during pregnancy    Encounter Details:  CNP Questionnaire - 09/23/23 1638       Questionnaire   Ask client: Do you give verbal consent for me to treat you today? Yes    Student Assistance [employer];Medical Student    Location Patient Served  [facility]    Visit Setting with Client Organization    Patient Status Migrant    Insurance Uninsured ([place] Card/Care Connects/Self-Pay/Medicaid Family Planning)    Insurance/Financial Assistance Referral N/A    Medication N/A    Medical Provider Yes    Screening Referrals Made N/A    Medical Referrals Made N/A    Medical Appointment Made N/A    Recently w/o PCP, now 1st time PCP visit completed due to CNs referral or appointment made N/A    Food N/A    Transportation N/A    Housing/Utilities N/A    Interpersonal Safety N/A    Interventions Advocate/Support;Educate    Abnormal to Normal Screening Since Last CN Visit N/A    Screenings CN Performed Blood Pressure;Blood Glucose    Sent Client to Lab for: N/A    Did client attend any of the following based off CNs referral or appointments made? N/A    ED Visit Averted N/A    Life-Saving Intervention Made N/A

## 2023-09-24 ENCOUNTER — Ambulatory Visit
Admission: RE | Admit: 2023-09-24 | Discharge: 2023-09-24 | Disposition: A | Discharge status: Home / Self Care | Payer: No Typology Code available for payment source | Source: Ambulatory Visit | Attending: Obstetrics and Gynecology | Admitting: Obstetrics and Gynecology

## 2023-09-24 DIAGNOSIS — N6321 Unspecified lump in the left breast, upper outer quadrant: Secondary | ICD-10-CM

## 2023-10-07 NOTE — Congregational Nurse Program (Signed)
  Dept: [phone]   Congregational Nurse Program Note  Date of Encounter: 10/07/2023 BP (!) 140/79   Pulse 69   LMP 09/19/2023 (Exact Date)  Jimmye Norman, RN, CCNP   Past Medical History: Past Medical History:  Diagnosis Date   Gestational diabetes    during pregnancy    Encounter Details:

## 2023-10-15 ENCOUNTER — Ambulatory Visit: Payer: Self-pay | Admitting: Internal Medicine

## 2023-10-19 NOTE — Telephone Encounter (Signed)
Patient has upcoming appointment with GYN

## 2023-11-02 ENCOUNTER — Ambulatory Visit: Payer: No Typology Code available for payment source | Admitting: Obstetrics and Gynecology

## 2023-11-02 ENCOUNTER — Encounter: Payer: Self-pay | Admitting: Obstetrics and Gynecology

## 2023-11-02 ENCOUNTER — Other Ambulatory Visit (HOSPITAL_COMMUNITY)
Admission: RE | Admit: 2023-11-02 | Discharge: 2023-11-02 | Disposition: A | Discharge status: Home / Self Care | Payer: Self-pay | Source: Ambulatory Visit | Attending: Obstetrics and Gynecology | Admitting: Obstetrics and Gynecology

## 2023-11-02 VITALS — BP 135/71 | HR 56 | Ht 61.0 in | Wt 163.0 lb

## 2023-11-02 DIAGNOSIS — Z603 Acculturation difficulty: Secondary | ICD-10-CM

## 2023-11-02 DIAGNOSIS — N939 Abnormal uterine and vaginal bleeding, unspecified: Secondary | ICD-10-CM

## 2023-11-02 DIAGNOSIS — N760 Acute vaginitis: Secondary | ICD-10-CM | POA: Insufficient documentation

## 2023-11-02 DIAGNOSIS — Z758 Other problems related to medical facilities and other health care: Secondary | ICD-10-CM

## 2023-11-02 DIAGNOSIS — N941 Unspecified dyspareunia: Secondary | ICD-10-CM

## 2023-11-02 MED ORDER — FLUCONAZOLE 150 MG PO TABS
150.0000 mg | ORAL_TABLET | Freq: Once | ORAL | 1 refills | Status: AC
Start: 2023-11-02 — End: 2023-11-02

## 2023-11-02 NOTE — Patient Instructions (Signed)
Nabothian cyst on cervix

## 2023-11-02 NOTE — Progress Notes (Signed)
New patient: Pt states that she was seen for a pap smear and doctor "told her she had a spot on her uterus". Saw a Dr. Delrae Alfred.  Patient states currently having itching and some white discharge.  In system patient notified of normal papsmear. Armandina Stammer RN

## 2023-11-02 NOTE — Progress Notes (Signed)
NEW GYNECOLOGY PATIENT Patient name: Kimberly Avila MRN [id number]  Date of birth: [birth year] Chief Complaint:   No chief complaint on file.     History:  Kimberly Avila is a 42 y.o. [phone] being seen today for postcoital bleeding and dyspareunia.  Discussed the use of AI scribe software for clinical note transcription with the patient, who gave verbal consent to proceed.  History of Present Illness   The patient, with an unclear gynecological history, presents with post-coital bleeding since January. The bleeding is consistent and occurs after each sexual encounter, but not in between menstrual cycles. The patient confirms regular menstrual cycles and denies any contraceptive use. She reports pain during intercourse, which started concurrently with the bleeding. The patient describes the pain as a feeling of tightness inside, particularly around the buttock area.  In the past, the patient had an abnormal Pap smear, the exact date of which is uncertain (either 2017 or 2018). The patient recalls being told that no cells were found on the Pap smear and was asked to return in two months for a repeat test. On the subsequent test, cells were found. However, the most recent Pap smear this year was reported as normal.  The patient also reports vaginal itching and irritation. No other changes or events were reported prior to the onset of these symptoms.            Gynecologic History Patient's last menstrual period was 10/19/2023 (exact date). Contraception: none Last Pap: 07/2023 NILM Last Mammogram: n/a Last Colonoscopy: n/a  Obstetric History OB History  Gravida Para Term Preterm AB Living  7 6 6     6   SAB IAB Ectopic Multiple Live Births          6    # Outcome Date GA Lbr Len/2nd Weight Sex Type Anes PTL Lv  7 Gravida           6 Term 03/21/14 [redacted]w[redacted]d 04:55 7 lb 12 oz (3.515 kg) F Vag-Spont None  LIV  5 Term 02/15/13 [redacted]w[redacted]d 01:35 / 00:10 8 lb 8.7 oz (3.875 kg) M  Vag-Spont EPI  LIV     Birth Comments: WNL  4 Term 10/28/09 [redacted]w[redacted]d  9 lb (4.082 kg) M Vag-Spont   LIV  3 Term 11/25/07 [redacted]w[redacted]d  8 lb 4 oz (3.742 kg) M Vag-Spont   LIV  2 Term 09/25/04 [redacted]w[redacted]d  7 lb (3.175 kg) M Vag-Spont   LIV  1 Term 02/21/02 [redacted]w[redacted]d  6 lb 6 oz (2.892 kg) F Vag-Spont   LIV    Past Medical History:  Diagnosis Date   Gestational diabetes    during pregnancy    Past Surgical History:  Procedure Laterality Date   CHOLECYSTECTOMY N/A 06/25/2019   Procedure: LAPAROSCOPIC CHOLECYSTECTOMY;  Surgeon: Emelia Loron, MD;  Location: [facility] OR;  Service: General;  Laterality: N/A;    Current Outpatient Medications on File Prior to Visit  Medication Sig Dispense Refill   ferrous gluconate (FERGON) 324 MG tablet 1 tab by mouth daily during period (Patient not taking: Reported on 08/18/2023)  3   No current facility-administered medications on file prior to visit.    Allergies  Allergen Reactions   Gelatin Itching   Ibuprofen Rash   Nyquil [Pseudoeph-Doxylamine-Dm-Apap] Itching   Tylenol [Acetaminophen] Itching    Social History:  reports that she has never smoked. She has never been exposed to tobacco smoke. She has never used smokeless tobacco. She reports that she does not drink alcohol  and does not use drugs.  Family History  Problem Relation Age of Onset   Other Mother        COVID 2021   Other Father        COVID 2021   Hypertension Father    Diabetes Maternal Uncle     The following portions of the patient's history were reviewed and updated as appropriate: allergies, current medications, past family history, past medical history, past social history, past surgical history and problem list.  Review of Systems Pertinent items noted in HPI and remainder of comprehensive ROS otherwise negative.  Physical Exam:  BP 135/71   Pulse (!) 56   Ht 5\' 1"  (1.549 m)   Wt 163 lb (73.9 kg)   LMP 10/19/2023 (Exact Date)   BMI 30.80 kg/m  Physical Exam Vitals and nursing  note reviewed. Exam conducted with a chaperone present.  Constitutional:      Appearance: Normal appearance.  Pulmonary:     Effort: Pulmonary effort is normal.  Abdominal:     Palpations: Abdomen is soft.  Genitourinary:    General: Normal vulva.     Exam position: Lithotomy position.     Comments: Nabothian cyst with aberrant vessel noted on cervix Erythematous, indurated labia majora and minora White discharge noted Tender pelvic floor muscles bilaterally, left > right Neurological:     Mental Status: She is alert.       Assessment and Plan:   Assessment and Plan    Postcoital Bleeding Bleeding after intercourse since January. No intermenstrual bleeding. Normal Pap smear this year. On examination, a Nabothian cyst and an irritated area on the cervix were noted, which could be the source of bleeding. -Treated the irritated area with silver nitrate. -If bleeding persists after treatment, further assessments will be conducted.  Pelvic Pain Pain during intercourse, which started with the onset of bleeding. On examination, pelvic floor muscles were found to be the likely cause of the pain. -Referral to pelvic floor physical therapy.      In person interpreter used for encounter   Please refer to After Visit Summary for other counseling recommendations.   Follow-up: Return in about 3 months (around 02/02/2024) for GYN Follow Up.      Lorriane Shire, MD Obstetrician & Gynecologist, Faculty Practice [facility] [employer], [facility]

## 2023-11-03 ENCOUNTER — Other Ambulatory Visit: Payer: Self-pay | Admitting: Obstetrics and Gynecology

## 2023-11-03 ENCOUNTER — Ambulatory Visit: Payer: Self-pay | Admitting: Internal Medicine

## 2023-11-03 ENCOUNTER — Encounter: Payer: Self-pay | Admitting: Internal Medicine

## 2023-11-03 VITALS — BP 128/80 | HR 80 | Resp 20 | Ht 61.0 in | Wt 163.0 lb

## 2023-11-03 DIAGNOSIS — L918 Other hypertrophic disorders of the skin: Secondary | ICD-10-CM | POA: Insufficient documentation

## 2023-11-03 LAB — CERVICOVAGINAL ANCILLARY ONLY
Bacterial Vaginitis (gardnerella): NEGATIVE
Candida Glabrata: NEGATIVE
Candida Vaginitis: POSITIVE — AB
Chlamydia: NEGATIVE
Comment: NEGATIVE
Comment: NEGATIVE
Comment: NEGATIVE
Comment: NEGATIVE
Comment: NEGATIVE
Comment: NORMAL
Neisseria Gonorrhea: NEGATIVE
Trichomonas: NEGATIVE

## 2023-11-03 NOTE — Progress Notes (Signed)
    Subjective:    Patient ID: Kimberly Avila, female   DOB: [birth year], 42 y.o.   MRN: [id number]   HPI  Kimberly Avila interprets  Has bump in between buttocks up relatively high.  Noted it as a smaller lesion 3 years ago and has continued to grow.  She believes it is skin color and never drains anything.  Hurts to touch.  Feels pinching sensation in the area after prolonged sitting.    HM:  has not had influenza or COVID vaccines this fall.  She declines today.  No outpatient medications have been marked as taking for the 11/03/23 encounter (Office Visit) with Julieanne Manson, MD.   Allergies  Allergen Reactions   Gelatin Itching   Ibuprofen Rash   Nyquil [Pseudoeph-Doxylamine-Dm-Apap] Itching   Tylenol [Acetaminophen] Itching     Review of Systems    Objective:   BP 128/80 (BP Location: Right Arm, Patient Position: Sitting, Cuff Size: Normal)   Pulse 80   Resp 20   Ht 5\' 1"  (1.549 m)   Wt 163 lb (73.9 kg)   LMP 10/19/2023 (Exact Date)   BMI 30.80 kg/m   Physical Exam Large smooth skin tag attached to upper left buttock at top of crease.  No erythema.    Assessment & Plan  Large skin tab, left buttock:  discussed removal and possible complications of infection and bleeding. and will have her make that appt.

## 2023-11-09 ENCOUNTER — Telehealth: Payer: Self-pay

## 2023-11-09 NOTE — Telephone Encounter (Signed)
Called patient to inform her that she tested positive for yeast. Diflucan was sent to her pharmacy on the day of her appointment.Understanding was voiced. Gadiel John l Melizza Kanode, CMA

## 2023-11-09 NOTE — Telephone Encounter (Signed)
Called patient with [facility] interpreter Norva Pavlov [phone].

## 2023-12-17 ENCOUNTER — Ambulatory Visit: Payer: Self-pay | Admitting: Internal Medicine

## 2024-01-13 ENCOUNTER — Telehealth: Payer: Self-pay

## 2024-01-13 NOTE — Telephone Encounter (Signed)
Called patient to schedule a follow up with Dr. Briscoe Deutscher. Left voicemail with an interpretor.

## 2024-01-21 ENCOUNTER — Encounter: Payer: Self-pay | Admitting: Internal Medicine

## 2024-01-21 ENCOUNTER — Ambulatory Visit: Payer: Self-pay | Admitting: Internal Medicine

## 2024-01-21 ENCOUNTER — Telehealth: Payer: Self-pay

## 2024-01-21 VITALS — BP 130/84 | HR 69 | Resp 20 | Wt 166.0 lb

## 2024-01-21 DIAGNOSIS — L918 Other hypertrophic disorders of the skin: Secondary | ICD-10-CM

## 2024-01-21 NOTE — Patient Instructions (Signed)
Call clinic for redness, swelling, increased pain, drainage or fever

## 2024-01-21 NOTE — Progress Notes (Signed)
Here for excision of large skin tag.  Base about 1.1 cm, left medial buttock at upper vertical crease. Written informed consent obtained  Skin excision  Date/Time: 01/21/2024 1:47 PM  Performed by: Julieanne Manson, MD Authorized by: Julieanne Manson, MD   Number of Lesions: 1 Lesion 1:    Skin lesion 1 location: Left upper medial buttock at crease.   Initial size (mm): 11   Final defect size (mm): 11   Malignancy: benign lesion     Destruction method comment: 15 blade used to excise at base and undermine edges of skin at opening   Repair type: linear closure   Closure complexity: simple     Repair comments: Lesion area cleaned and draped in sterile fashion 2% lidocaine 6 ml in total used for local anesthesia After excision of skin lesion, edges of wound opening undermined beneath full skin thickness for adequate linear closure. Hyfrecator used for good hemostasis Closed with 2 simple interrupted ethilon 4-0 sutures with good hemostasis Patient tolerated well without complication  Comments:  Instructed not to get wound wet for 3 days Thereafter can have water gently wash over area, but no scrubbing. Change dressing daily and reapply bacitracin ointment followed by ABD pads supplied No sitting on or against area for now.   Sleep on right side. Call for redness, discharge, swelling, increased pain or fever Return for suture removal in 7 days.

## 2024-01-21 NOTE — Telephone Encounter (Signed)
Patient would like an appointment for kidney concerns.   We will call patient if there is a cancellation.

## 2024-01-27 ENCOUNTER — Ambulatory Visit: Payer: Self-pay | Admitting: Internal Medicine

## 2024-01-27 ENCOUNTER — Encounter: Payer: Self-pay | Admitting: Internal Medicine

## 2024-01-27 VITALS — BP 122/82 | HR 64 | Resp 16 | Ht 61.0 in | Wt 164.0 lb

## 2024-01-27 DIAGNOSIS — M545 Low back pain, unspecified: Secondary | ICD-10-CM

## 2024-01-27 DIAGNOSIS — L918 Other hypertrophic disorders of the skin: Secondary | ICD-10-CM

## 2024-01-27 LAB — POCT URINALYSIS DIPSTICK
Bilirubin, UA: NEGATIVE
Blood, UA: NEGATIVE
Glucose, UA: NEGATIVE
Ketones, UA: NEGATIVE
Leukocytes, UA: NEGATIVE
Nitrite, UA: NEGATIVE
Protein, UA: NEGATIVE
Spec Grav, UA: 1.015 (ref 1.010–1.025)
Urobilinogen, UA: 0.2 U/dL
pH, UA: 6.5 (ref 5.0–8.0)

## 2024-01-27 NOTE — Telephone Encounter (Signed)
 Patient has been scheduled

## 2024-01-27 NOTE — Progress Notes (Signed)
    Subjective:    Patient ID: Kimberly Avila, female   DOB: Nov 19, 1981, 43 y.o.   MRN: [id number]   HPI [name] interprets  Left low back pain as if someone punching her there.  Has had for close to two weeks.  States had had symptoms for about 5 days prior to be seen for skin tag removal on 01/21/24 Burning in low back. No radiation  No dysuria.   Possibly urinary frequency, but drinking lots of water.   Prior to increasing her fluid intake, she noted foaming in her urine.   No fever.     No outpatient medications have been marked as taking for the 01/27/24 encounter (Office Visit) with [name], MD.   Allergies  Allergen Reactions   Gelatin Itching   Ibuprofen  Rash   Nyquil [Pseudoeph-Doxylamine-Dm-Apap] Itching   Tylenol  [Acetaminophen ] Itching     Review of Systems    Objective:   BP 122/82 (BP Location: Right Arm, Patient Position: Sitting, Cuff Size: Normal)   Pulse 64   Resp 16   Ht 5' 1 (1.549 m)   Wt 164 lb (74.4 kg)   LMP 01/21/2024 (Exact Date)   BMI 30.99 kg/m   Physical Exam NAD Lungs:  CTA CV:  RRR without murmur or rub.  Radial pulses normal and equal. Abd:  S, minimal tenderness LLQ,  No HSM or mass, + BS.  NT over suprapubic area. Back:  No flank tenderness.  NT over spinous processes.  Tender over upper left lumbar paraspinous musculature.   Left buttock wound:  healing well, but still a bit damp at distal area.  No erythema.  Edges well opposed. Assessment & Plan   Left Low back pain:  UA normal.  Feel this is muscular in origin.  Aleve 2 tabs twice daily with food. Stretches with warm packing prior.    2.  Large skin tag removal:  will reschedule her for removal of sutures in 2 days.

## 2024-01-28 ENCOUNTER — Ambulatory Visit: Payer: Self-pay | Admitting: Internal Medicine

## 2024-01-29 ENCOUNTER — Ambulatory Visit: Payer: Self-pay | Admitting: Internal Medicine

## 2024-01-29 VITALS — BP 118/68 | HR 75 | Resp 16 | Ht 61.0 in | Wt 166.0 lb

## 2024-01-29 DIAGNOSIS — Z4802 Encounter for removal of sutures: Secondary | ICD-10-CM

## 2024-01-29 NOTE — Progress Notes (Signed)
 Wound from large skin tag removal with edges well opposed, minor scabbin over suture line. No erythema or discharge.  2 simple interrupted sutures removed. Patient tolerated well without complication To call for redness, swelling, discharge.   Gently clean and avoid removal of scab.

## 2024-03-04 ENCOUNTER — Other Ambulatory Visit: Payer: Self-pay

## 2024-03-04 DIAGNOSIS — Z0000 Encounter for general adult medical examination without abnormal findings: Secondary | ICD-10-CM

## 2024-03-05 LAB — COMPREHENSIVE METABOLIC PANEL
ALT: 25 IU/L (ref 0–32)
AST: 17 IU/L (ref 0–40)
Albumin: 4.2 g/dL (ref 3.9–4.9)
Alkaline Phosphatase: 62 IU/L (ref 44–121)
BUN/Creatinine Ratio: 14 (ref 9–23)
BUN: 10 mg/dL (ref 6–24)
Bilirubin Total: 0.5 mg/dL (ref 0.0–1.2)
CO2: 22 mmol/L (ref 20–29)
Calcium: 8.9 mg/dL (ref 8.7–10.2)
Chloride: 103 mmol/L (ref 96–106)
Creatinine, Ser: 0.71 mg/dL (ref 0.57–1.00)
Globulin, Total: 2.6 g/dL (ref 1.5–4.5)
Glucose: 96 mg/dL (ref 70–99)
Potassium: 4 mmol/L (ref 3.5–5.2)
Sodium: 137 mmol/L (ref 134–144)
Total Protein: 6.8 g/dL (ref 6.0–8.5)
eGFR: 110 mL/min/{1.73_m2} (ref >59)

## 2024-03-05 LAB — CBC WITH DIFFERENTIAL/PLATELET
Basophils Absolute: 0.1 10*3/uL (ref 0.0–0.2)
Basos: 1 %
EOS (ABSOLUTE): 0.3 10*3/uL (ref 0.0–0.4)
Eos: 3 %
Hematocrit: 40 % (ref 34.0–46.6)
Hemoglobin: 13.1 g/dL (ref 11.1–15.9)
Immature Grans (Abs): 0 10*3/uL (ref 0.0–0.1)
Immature Granulocytes: 0 %
Lymphocytes Absolute: 3 10*3/uL (ref 0.7–3.1)
Lymphs: 35 %
MCH: 27.8 pg (ref 26.6–33.0)
MCHC: 32.8 g/dL (ref 31.5–35.7)
MCV: 85 fL (ref 79–97)
Monocytes Absolute: 0.8 10*3/uL (ref 0.1–0.9)
Monocytes: 9 %
Neutrophils Absolute: 4.6 10*3/uL (ref 1.4–7.0)
Neutrophils: 52 %
Platelets: 239 10*3/uL (ref 150–450)
RBC: 4.72 x10E6/uL (ref 3.77–5.28)
RDW: 13.5 % (ref 11.7–15.4)
WBC: 8.8 10*3/uL (ref 3.4–10.8)

## 2024-03-05 LAB — LIPID PANEL W/O CHOL/HDL RATIO
Cholesterol, Total: 138 mg/dL (ref 100–199)
HDL: 44 mg/dL (ref >39)
LDL Chol Calc (NIH): 80 mg/dL (ref 0–99)
Triglycerides: 66 mg/dL (ref 0–149)
VLDL Cholesterol Cal: 14 mg/dL (ref 5–40)

## 2024-03-09 ENCOUNTER — Encounter: Payer: Self-pay | Admitting: Internal Medicine

## 2024-03-09 ENCOUNTER — Ambulatory Visit: Payer: Self-pay | Admitting: Internal Medicine

## 2024-03-09 VITALS — BP 112/70 | HR 86 | Resp 16 | Ht 61.25 in | Wt 166.0 lb

## 2024-03-09 DIAGNOSIS — Z23 Encounter for immunization: Secondary | ICD-10-CM

## 2024-03-09 DIAGNOSIS — H579 Unspecified disorder of eye and adnexa: Secondary | ICD-10-CM

## 2024-03-09 DIAGNOSIS — N920 Excessive and frequent menstruation with regular cycle: Secondary | ICD-10-CM

## 2024-03-09 DIAGNOSIS — N930 Postcoital and contact bleeding: Secondary | ICD-10-CM

## 2024-03-09 DIAGNOSIS — Z0000 Encounter for general adult medical examination without abnormal findings: Secondary | ICD-10-CM

## 2024-03-09 DIAGNOSIS — K029 Dental caries, unspecified: Secondary | ICD-10-CM

## 2024-03-09 DIAGNOSIS — O24419 Gestational diabetes mellitus in pregnancy, unspecified control: Secondary | ICD-10-CM | POA: Insufficient documentation

## 2024-03-09 DIAGNOSIS — N941 Unspecified dyspareunia: Secondary | ICD-10-CM

## 2024-03-09 NOTE — Progress Notes (Signed)
 Subjective:    Patient ID: Kimberly Avila, female   DOB: [birth year], 43 y.o.   MRN: [id number]   HPI  Kimberly Avila  CPE without pap  1.  Pap:  Last performed 08/18/2023 due to vaginal bleeding and findings of irritated area of cervix.  Pap was normal.   Was subsequently referred to Dr. Briscoe Deutscher, gynecology and found to have nabothian cyst with surrounding irritation and treated with silver nitrate.   Her dyspareunia was felt due to pelvic floor muscles and was referred to PT to improve. She was to return to gyn office if the  postcoital bleeding continued.   She states she never heard from PT, though appears she was called twice to set up (with interpreter) and message left. She never returned to gynecology as recommended as well. States she was not aware of follow up. In addition, her periods remain heavy with clots, though regular.  This since SAB in 2023 Had [place] in 06/2022 at time of SAB and no retained intrauterine tissue.   Recent CBC normal with hemoglobin of 13.1  2.  Mammogram:  Last performed in 09/2023 for two medial left breat lesions, found on [place] to be skin lesions.  Otherwise mammogram/US normal.    3.  Osteoprevention: only 1 cup of milk daily.  She believes she can drink 3 cups of milk daily.  Not very physically active during cold months.    4.  Guaiac Cards/FIT:  Last in 07/2023 and negative for blood.  5.  Colonoscopy:  Never.  No family history of colon cancer.    6.  Immunizations:  Has not had influenza nor COVID vaccine this year or many years past.  Has never had a COVID vaccine.   Due for Td today.   Immunization History  Administered Date(s) Administered   Influenza Split 09/15/2012   Td 02/18/2007   Tdap 11/29/2012, 01/02/2014     7.  Glucose/Cholesterol:  Fasting glucose and cholesterol panels fine.    Current Meds  Medication Sig   ferrous gluconate (FERGON) 324 MG tablet 1 tab by mouth daily during period   Allergies  Allergen  Reactions   Gelatin Itching   Ibuprofen Rash   Nyquil [Pseudoeph-Doxylamine-Dm-Apap] Itching   Tylenol [Acetaminophen] Itching   Past Medical History:  Diagnosis Date   Dyspareunia in female    Gestational diabetes    during pregnancy   Menorrhagia with regular cycle    PCB (post coital bleeding)    Past Surgical History:  Procedure Laterality Date   CHOLECYSTECTOMY N/A 06/25/2019   Procedure: LAPAROSCOPIC CHOLECYSTECTOMY;  Surgeon: Emelia Loron, MD;  Location: [facility] OR;  Service: General;  Laterality: N/A;   Family History  Problem Relation Age of Onset   Other Mother        COVID 2021   Other Father        COVID 2021   Hypertension Father    Diabetes Maternal Uncle    Family Status  Relation Name Status   Mother  Deceased at age 73   Father  Deceased at age 18   Sister  Alive, age 43y   Sister  Alive, age 44y   Brother  Alive   Brother  Alive   Brother  Alive   Brother  Alive   Brother  Alive   Daughter Kimberly Avila, age 22y   Daughter Kimberly Avila, age 10y   Son Kimberly Avila, age 19y   Son Kimberly Avila, age 26y  Son Kimberly Avila, age 8y   Son Kimberly Avila, age 11y   Mat Uncle  (Not Specified)  No partnership data on file   Social History   Socioeconomic History   Marital status: Married    Spouse name: Dessa Phi   Number of children: 6   Years of education: Not on file   Highest education level: 6th grade  Occupational History   Occupation: Housewife  Tobacco Use   Smoking status: Never    Passive exposure: Never   Smokeless tobacco: Never  Vaping Use   Vaping status: Never Used  Substance and Sexual Activity   Alcohol use: No   Drug use: No   Sexual activity: Yes    Birth control/protection: None, Condom  Other Topics Concern   Not on file  Social History Narrative   Lives at home with husband and 4 children.   2 oldest children married and live in [place].   Social Drivers of Corporate investment banker Strain: Low Risk   (03/09/2024)   Overall Financial Resource Strain (CARDIA)    Difficulty of Paying Living Expenses: Not hard at all  Food Insecurity: No Food Insecurity (03/09/2024)   Hunger Vital Sign    Worried About Running Out of Food in the Last Year: Never true    Ran Out of Food in the Last Year: Never true  Transportation Needs: No Transportation Needs (03/09/2024)   PRAPARE - Administrator, Civil Service (Medical): No    Lack of Transportation (Non-Medical): No  Physical Activity: Not on file  Stress: Not on file  Social Connections: Not on file  Intimate Partner Violence: Not At Risk (03/09/2024)   Humiliation, Afraid, Rape, and Kick questionnaire    Fear of Current or Ex-Partner: No    Emotionally Abused: No    Physically Abused: No    Sexually Abused: No     Review of Systems  HENT:  Positive for dental problem.   Eyes:  Negative for visual disturbance (Brown spot on right eye above lateral iris.  Has been there since a child and has not changed with time.).  Respiratory:  Negative for shortness of breath.   Cardiovascular:  Negative for chest pain, palpitations and leg swelling.  Neurological:  Negative for weakness and numbness.  Psychiatric/Behavioral:  Negative for dysphoric mood. The patient is not nervous/anxious.       Objective:   BP 112/70 (BP Location: Left Arm, Patient Position: Sitting, Cuff Size: Normal)   Pulse 86   Resp 16   Ht 5' 1.25" (1.556 m)   Wt 166 lb (75.3 kg)   LMP 02/15/2024 (Exact Date) Comment: heavy with blood clots  SpO2 99%   BMI 31.11 kg/m   Physical Exam HENT:     Head: Normocephalic and atraumatic.     Right Ear: Tympanic membrane, ear canal and external ear normal.     Left Ear: Tympanic membrane, ear canal and external ear normal.     Nose: Nose normal.     Mouth/Throat:     Mouth: Mucous membranes are moist.     Pharynx: Oropharynx is clear.  Eyes:     Extraocular Movements: Extraocular movements intact.      Conjunctiva/sclera: Conjunctivae normal.     Pupils: Pupils are equal, round, and reactive to light.     Comments: Discs sharp Cafe colored non-elevated irregular lesion superior and lateral to right iris.  Neck:     Thyroid: No thyroid mass or  thyromegaly.  Cardiovascular:     Rate and Rhythm: Normal rate and regular rhythm.     Heart sounds: S1 normal and S2 normal. No murmur heard.    No friction rub. No S3 or S4 sounds.     Comments: No carotid bruits.  Carotid, radial, femoral, DP and PT pulses normal and equal.   Pulmonary:     Effort: Pulmonary effort is normal.     Breath sounds: Normal breath sounds and air entry.  Chest:  Breasts:    Right: No inverted nipple, mass or nipple discharge.     Left: No inverted nipple, mass or nipple discharge.  Abdominal:     General: Bowel sounds are normal.     Palpations: Abdomen is soft. There is no hepatomegaly, splenomegaly or mass.     Tenderness: There is no abdominal tenderness.     Hernia: No hernia is present.  Genitourinary:    Comments: Deferred to Dr. Briscoe Deutscher Musculoskeletal:        General: Normal range of motion.     Cervical back: Normal range of motion and neck supple.     Right lower leg: No edema.     Left lower leg: No edema.  Lymphadenopathy:     Head:     Right side of head: No submental or submandibular adenopathy.     Left side of head: No submental or submandibular adenopathy.     Cervical: No cervical adenopathy.     Upper Body:     Right upper body: No supraclavicular or axillary adenopathy.     Left upper body: No supraclavicular or axillary adenopathy.     Lower Body: No right inguinal adenopathy. No left inguinal adenopathy.  Skin:    General: Skin is warm.     Capillary Refill: Capillary refill takes less than 2 seconds.     Findings: No rash.  Neurological:     General: No focal deficit present.     Mental Status: She is alert and oriented to person, place, and time.     Cranial Nerves: Cranial  nerves 2-12 are intact.     Sensory: Sensation is intact.     Motor: Motor function is intact.     Coordination: Coordination is intact.     Gait: Gait is intact.     Deep Tendon Reflexes: Reflexes are normal and symmetric.  Psychiatric:        Mood and Affect: Mood normal.        Speech: Speech normal.        Behavior: Behavior normal. Behavior is cooperative.      Assessment & Plan   CPE Td today Discussed vaccines in general/benefits. Return FIT in 2 weeks.  2.  Menorrhagia/Dyspareunia/post coital bleeding:  referral back to Dr. Briscoe Deutscher.  Encouraged her to follow up--call if she does not hear back as discussed at visits and also to listen to voicemail.    3.  Dental Decay:  Dental referral.  4.  Right eye lesion:  referral to optometry for malignancy potential.   Has not changed, however, with time (noted since child)

## 2024-04-13 LAB — BASIC METABOLIC PANEL WITH GFR: Glucose: 116

## 2024-04-13 NOTE — Congregational Nurse Program (Signed)
  Dept: [phone]   Congregational Nurse Program Note  Date of Encounter: 04/13/2024 BP 130/72 (BP Location: Left Arm, Patient Position: Sitting, Cuff Size: Normal)   Pulse 78  Pheobe Brass, RN, CCNP  Past Medical History: Past Medical History:  Diagnosis Date   Dyspareunia in female    Gestational diabetes    during pregnancy   Menorrhagia with regular cycle    PCB (post coital bleeding)     Encounter Details:  Community Questionnaire - 04/13/24 1655       Questionnaire   Ask client: Do you give verbal consent for me to treat you today? Yes    Student Assistance [employer]    Location Patient Served  [facility]    Encounter Setting CN site    Population Status Migrant/Refugee    Insurance Unknown    Insurance/Financial Assistance Referral N/A    Medication N/A    Medical Provider Yes    Screening Referrals Made N/A    Medical Referrals Made N/A    Medical Appointment Completed N/A    CNP Interventions Advocate/Support;Educate    Screenings CN Performed Blood Pressure;Blood Glucose    Life-Saving Intervention Made N/A

## 2024-11-02 LAB — GLUCOSE, POCT (MANUAL RESULT ENTRY): POC Glucose: 109 mg/dL — AB (ref 70–99)

## 2024-11-02 NOTE — Congregational Nurse Program (Signed)
  Dept: [phone]   Congregational Nurse Program Note  Date of Encounter: 11/02/2024 Pt to [facility] community center for bp and cbg check. Pt suspects she is pregnant. Pt states is going to wait a bit to make apt with pcp. Otherwise she feels well. Enc pt to return to this clinic if any questions or concerns.  Landry Slocumb, RN, CCNP   Past Medical History: Past Medical History:  Diagnosis Date   Dyspareunia in female    Gestational diabetes    during pregnancy   Menorrhagia with regular cycle    PCB (post coital bleeding)     Encounter Details:  Community Questionnaire - 11/02/24 1557       Questionnaire   Ask client: Do you give verbal consent for me to treat you today? Yes    Student Assistance N/A    Location Patient Served  [facility]    Encounter Setting CN site    Population Status Migrant/Refugee    Insurance Medicaid    Insurance/Financial Assistance Referral N/A    Medication N/A    Medical Provider Yes    Screening Referrals Made N/A    Medical Referrals Made N/A    Medical Appointment Completed N/A    CNP Interventions Educate    Screenings CN Performed N/A    ED Visit Averted N/A    Life-Saving Intervention Made N/A

## 2024-12-16 ENCOUNTER — Encounter (HOSPITAL_COMMUNITY): Payer: Self-pay | Admitting: Obstetrics and Gynecology

## 2024-12-16 ENCOUNTER — Inpatient Hospital Stay (HOSPITAL_COMMUNITY): Payer: Self-pay

## 2024-12-16 ENCOUNTER — Inpatient Hospital Stay (HOSPITAL_COMMUNITY)
Admission: EM | Admit: 2024-12-16 | Discharge: 2024-12-16 | Disposition: A | Discharge status: Home / Self Care | Payer: Self-pay | Attending: Obstetrics and Gynecology | Admitting: Obstetrics and Gynecology

## 2024-12-16 ENCOUNTER — Other Ambulatory Visit: Payer: Self-pay

## 2024-12-16 DIAGNOSIS — Z3A11 11 weeks gestation of pregnancy: Secondary | ICD-10-CM

## 2024-12-16 DIAGNOSIS — O208 Other hemorrhage in early pregnancy: Secondary | ICD-10-CM | POA: Insufficient documentation

## 2024-12-16 DIAGNOSIS — R109 Unspecified abdominal pain: Secondary | ICD-10-CM

## 2024-12-16 DIAGNOSIS — O26899 Other specified pregnancy related conditions, unspecified trimester: Secondary | ICD-10-CM

## 2024-12-16 DIAGNOSIS — O26891 Other specified pregnancy related conditions, first trimester: Secondary | ICD-10-CM

## 2024-12-16 DIAGNOSIS — Z603 Acculturation difficulty: Secondary | ICD-10-CM | POA: Insufficient documentation

## 2024-12-16 DIAGNOSIS — Z3A12 12 weeks gestation of pregnancy: Secondary | ICD-10-CM | POA: Insufficient documentation

## 2024-12-16 LAB — URINALYSIS, ROUTINE W REFLEX MICROSCOPIC
Bilirubin Urine: NEGATIVE
Glucose, UA: NEGATIVE mg/dL
Hgb urine dipstick: NEGATIVE
Ketones, ur: NEGATIVE mg/dL
Nitrite: NEGATIVE
Protein, ur: NEGATIVE mg/dL
Specific Gravity, Urine: 1.023 (ref 1.005–1.030)
pH: 7 (ref 5.0–8.0)

## 2024-12-16 LAB — CBC
HCT: 37.7 % (ref 36.0–46.0)
Hemoglobin: 12.8 g/dL (ref 12.0–15.0)
MCH: 28 pg (ref 26.0–34.0)
MCHC: 34 g/dL (ref 30.0–36.0)
MCV: 82.5 fL (ref 80.0–100.0)
Platelets: 262 K/uL (ref 150–400)
RBC: 4.57 MIL/uL (ref 3.87–5.11)
RDW: 14.1 % (ref 11.5–15.5)
WBC: 10.2 K/uL (ref 4.0–10.5)
nRBC: 0 % (ref 0.0–0.2)

## 2024-12-16 LAB — WET PREP, GENITAL
Clue Cells Wet Prep HPF POC: NONE SEEN
Sperm: NONE SEEN
Trich, Wet Prep: NONE SEEN
WBC, Wet Prep HPF POC: >=10 — AB
Yeast Wet Prep HPF POC: NONE SEEN

## 2024-12-16 LAB — HCG, QUANTITATIVE, PREGNANCY: hCG, Beta Chain, Quant, S: 76374 m[IU]/mL — ABNORMAL HIGH

## 2024-12-16 LAB — POCT PREGNANCY, URINE: Preg Test, Ur: POSITIVE — AB

## 2024-12-16 MED ORDER — PRENATAL PLUS 27-1 MG PO TABS: 1.0000 | ORAL_TABLET | Freq: Every day | ORAL | 12 refills | Status: AC

## 2024-12-16 NOTE — MAU Provider Note (Signed)
 " History     [id number]  Arrival date and time: 12/16/24 1535    Chief Complaint  Patient presents with   Abdominal Pain     HPI Kimberly Avila is a 43 y.o. at [redacted]w[redacted]d by LMP who presents for having prior abdominal pain that is now presenting with lower abdominal pain and cramping. She is not having bleeding. She is concerned about primarily the pregnancy. She reports anytime she has sexual intercourse, she has more bleeding. She showed me a picture from her prior bleeding and it had a clot the size of an orange.  She denies constipation. Last BM today. No fever/nausea or vomiting.     OB History     Gravida  8   Para  6   Term  6   Preterm      AB      Living  6      SAB      IAB      Ectopic      Multiple      Live Births  6           Past Medical History:  Diagnosis Date   Dyspareunia in female    Gestational diabetes    during pregnancy   Menorrhagia with regular cycle    PCB (post coital bleeding)     Past Surgical History:  Procedure Laterality Date   CHOLECYSTECTOMY N/A 06/25/2019   Procedure: LAPAROSCOPIC CHOLECYSTECTOMY;  Surgeon: Ebbie Cough, MD;  Location: [facility] OR;  Service: General;  Laterality: N/A;    Family History  Problem Relation Age of Onset   Other Mother        COVID 2021   Other Father        COVID 2021   Hypertension Father    Diabetes Maternal Uncle     Social History   Socioeconomic History   Marital status: Married    Spouse name: Norman Sprung   Number of children: 6   Years of education: Not on file   Highest education level: 6th grade  Occupational History   Occupation: Housewife  Tobacco Use   Smoking status: Never    Passive exposure: Never   Smokeless tobacco: Never  Vaping Use   Vaping status: Never Used  Substance and Sexual Activity   Alcohol  use: No   Drug use: No   Sexual activity: Yes    Birth control/protection: None, Condom  Other Topics Concern   Not on file  Social  History Narrative   Lives at home with husband and 4 children.   2 oldest children married and live in [place].   Social Drivers of Health   Tobacco Use: Low Risk (03/09/2024)   Patient History    Smoking Tobacco Use: Never    Smokeless Tobacco Use: Never    Passive Exposure: Never  Financial Resource Strain: Low Risk (03/09/2024)   Overall Financial Resource Strain (CARDIA)    Difficulty of Paying Living Expenses: Not hard at all  Food Insecurity: No Food Insecurity (03/09/2024)   Hunger Vital Sign    Worried About Running Out of Food in the Last Year: Never true    Ran Out of Food in the Last Year: Never true  Transportation Needs: No Transportation Needs (03/09/2024)   PRAPARE - Administrator, Civil Service (Medical): No    Lack of Transportation (Non-Medical): No  Physical Activity: Not on file  Stress: Not on file  Social Connections: Not on file  Intimate Partner Violence: Not At Risk (03/09/2024)   Humiliation, Afraid, Rape, and Kick questionnaire    Fear of Current or Ex-Partner: No    Emotionally Abused: No    Physically Abused: No    Sexually Abused: No  Depression (PHQ2-9): Low Risk (12/10/2022)   Depression (PHQ2-9)    PHQ-2 Score: 0  Alcohol  Screen: Not on file  Housing: Unknown (03/09/2024)   Housing Stability Vital Sign    Unable to Pay for Housing in the Last Year: No    Number of Times Moved in the Last Year: Not on file    Homeless in the Last Year: No  Utilities: Not At Risk (03/09/2024)   [facility] Utilities    Threatened with loss of utilities: No  Health Literacy: Not on file    Allergies[1]  Medications Ordered Prior to Encounter[2]   Review of Systems  Constitutional: Negative.   HENT: Negative.    Eyes: Negative.   Respiratory: Negative.    Cardiovascular: Negative.   Gastrointestinal:  Positive for abdominal pain. Negative for blood in stool, constipation, diarrhea, nausea and vomiting.  Genitourinary:  Negative for dysuria, flank  pain, frequency, hematuria and urgency.  Musculoskeletal: Negative.   Skin: Negative.   Neurological: Negative.   Endo/Heme/Allergies: Negative.   Psychiatric/Behavioral: Negative.     Pertinent positives and negative per HPI, all others reviewed and negative  Physical Exam   BP 130/87 (BP Location: Right Arm)   Pulse 76   Temp 98 F (36.7 C) (Oral)   Resp 17   Ht 5' 1 (1.549 m)   Wt 74.8 kg   LMP 09/26/2024 (Approximate) Comment: heavy with blood clots  SpO2 100%   BMI 31.18 kg/m   Patient Vitals for the past 24 hrs:  BP Temp Temp src Pulse Resp SpO2 Height Weight  12/16/24 1727 130/87 98 F (36.7 C) Oral 76 17 100 % 5' 1 (1.549 m) 74.8 kg    Physical Exam Constitutional:      Appearance: She is well-developed and normal weight.  HENT:     Head: Normocephalic and atraumatic.     Mouth/Throat:     Mouth: Mucous membranes are moist.  Eyes:     Extraocular Movements: Extraocular movements intact.  Cardiovascular:     Rate and Rhythm: Normal rate.  Pulmonary:     Effort: Pulmonary effort is normal.  Abdominal:     General: Abdomen is flat.  Skin:    General: Skin is warm.     Capillary Refill: Capillary refill takes less than 2 seconds.  Neurological:     General: No focal deficit present.     Mental Status: She is alert and oriented to person, place, and time.  Psychiatric:        Mood and Affect: Mood normal.        Behavior: Behavior normal.      Labs Results for orders placed or performed during the hospital encounter of 12/16/24 (from the past 24 hours)  Pregnancy, urine POC     Status: Abnormal   Collection Time: 12/16/24  5:15 PM  Result Value Ref Range   Preg Test, Ur POSITIVE (A) NEGATIVE  Wet prep, genital     Status: Abnormal   Collection Time: 12/16/24  5:45 PM   Specimen: Vaginal  Result Value Ref Range   Yeast Wet Prep HPF POC NONE SEEN NONE SEEN   Trich, Wet Prep NONE SEEN NONE SEEN   Clue Cells Wet Prep HPF POC NONE SEEN  NONE SEEN    WBC, Wet Prep HPF POC >=10 (A) <10   Sperm NONE SEEN   CBC     Status: None   Collection Time: 12/16/24  5:47 PM  Result Value Ref Range   WBC 10.2 4.0 - 10.5 K/uL   RBC 4.57 3.87 - 5.11 MIL/uL   Hemoglobin 12.8 12.0 - 15.0 g/dL   HCT 62.2 63.9 - 53.9 %   MCV 82.5 80.0 - 100.0 fL   MCH 28.0 26.0 - 34.0 pg   MCHC 34.0 30.0 - 36.0 g/dL   RDW 85.8 88.4 - 84.4 %   Platelets 262 150 - 400 K/uL   nRBC 0.0 0.0 - 0.2 %  hCG, quantitative, pregnancy     Status: Abnormal   Collection Time: 12/16/24  5:47 PM  Result Value Ref Range   hCG, Beta Chain, Quant, S 76,374 (H) <5 mIU/mL    Imaging No results found.  MAU Course  Procedures Lab Orders         Wet prep, genital         CBC         hCG, quantitative, pregnancy         Urinalysis, Routine w reflex microscopic -Urine, Clean Catch         Pregnancy, urine POC    Meds ordered this encounter  Medications   prenatal vitamin w/FE, FA (PRENATAL 1 + 1) 27-1 MG TABS tablet    Sig: Take 1 tablet by mouth daily at 12 noon.    Dispense:  30 tablet    Refill:  12   Imaging Orders         US  OB Comp Less 14 Wks     MDM US  reviewed - viable IUP. No evidence of pelvic pathology by US . US  consistent with dates by LMP. FHT 153.    Assessment and Plan  Language barrier Spanish interpreter used throughout.   2. Abdominal pain in pregnancy - Will check UA and let her know if any evidence of infection. Reviewed GC/CT pending.  - CBC normal - Wet prep negative.  - Reviewed red flag symptoms for when to return.  - Advised again intercourse until she is out of the first trimester.  - Reviewed options for [facility] - she has access card. Discussed [facility] vs [facility] to establish [facility]    Vina Solian, MD 12/16/2024 7:47 PM  Allergies as of 12/16/2024       Reactions   Gelatin Itching   Ibuprofen  Rash   Nyquil [pseudoeph-doxylamine-dm-apap] Itching   Tylenol  [acetaminophen ] Itching        Medication List     STOP taking these  medications    ferrous gluconate  324 MG tablet Commonly known as: FERGON       TAKE these medications    prenatal vitamin w/FE, FA 27-1 MG Tabs tablet Take 1 tablet by mouth daily at 12 noon.           [1]  Allergies Allergen Reactions   Gelatin Itching   Ibuprofen  Rash   Nyquil [Pseudoeph-Doxylamine-Dm-Apap] Itching   Tylenol  [Acetaminophen ] Itching  [2]  No current facility-administered medications on file prior to encounter.   Current Outpatient Medications on File Prior to Encounter  Medication Sig Dispense Refill   ferrous gluconate  (FERGON) 324 MG tablet 1 tab by mouth daily during period  3   "

## 2024-12-16 NOTE — MAU Note (Signed)
 Kimberly Avila is a 43 y.o. at Unknown here in MAU reporting: Did not have a period in November. +HPT on 11/06/24 had intercourse with husband on 11/07/24 and had bleeding afterwards and assumed it was a miscarriage. Was not evaluated for bleeding. Started bleeding again on 12/13/24 and it was a lot heavier and large clots were present. Pt has picture on phone of large blood clot. States the bleeding stopped on the 24th and no bleeding today. Is having lower abdominal pain and lower back pain today that comes and goes. Denies any medication for cramping.   Interpreter Alfonse # [phone] used for triage  LMP: 09/26/24 Onset of complaint: ongoing Pain score: 3 Vitals:   12/16/24 1727  BP: 130/87  Pulse: 76  Resp: 17  Temp: 98 F (36.7 C)  SpO2: 100%     FHT:n/a Lab orders placed from triage:  CUBA workup

## 2024-12-19 LAB — GC/CHLAMYDIA PROBE AMP (~~LOC~~) NOT AT ARMC
Chlamydia: NEGATIVE
Comment: NEGATIVE
Comment: NORMAL
Neisseria Gonorrhea: NEGATIVE

## 2025-03-09 ENCOUNTER — Other Ambulatory Visit: Payer: Self-pay

## 2025-03-13 ENCOUNTER — Encounter: Payer: Self-pay | Admitting: Internal Medicine
